# Patient Record
Sex: Female | Born: 1960 | Race: White | Hispanic: No | Marital: Married | Smoking: Never smoker
Health system: Southern US, Community
[De-identification: ages and names within clinical notes are randomized; demographics above are authoritative.]

## PROBLEM LIST (undated history)

## (undated) DIAGNOSIS — M2550 Pain in unspecified joint: Secondary | ICD-10-CM

## (undated) DIAGNOSIS — N289 Disorder of kidney and ureter, unspecified: Secondary | ICD-10-CM

## (undated) DIAGNOSIS — I10 Essential (primary) hypertension: Secondary | ICD-10-CM

## (undated) DIAGNOSIS — R5383 Other fatigue: Secondary | ICD-10-CM

## (undated) DIAGNOSIS — R0602 Shortness of breath: Secondary | ICD-10-CM

## (undated) DIAGNOSIS — B009 Herpesviral infection, unspecified: Secondary | ICD-10-CM

## (undated) DIAGNOSIS — M549 Dorsalgia, unspecified: Secondary | ICD-10-CM

## (undated) DIAGNOSIS — M25474 Effusion, right foot: Secondary | ICD-10-CM

## (undated) DIAGNOSIS — T7840XA Allergy, unspecified, initial encounter: Secondary | ICD-10-CM

## (undated) DIAGNOSIS — J45909 Unspecified asthma, uncomplicated: Secondary | ICD-10-CM

## (undated) DIAGNOSIS — M25475 Effusion, left foot: Secondary | ICD-10-CM

## (undated) DIAGNOSIS — R7303 Prediabetes: Secondary | ICD-10-CM

## (undated) DIAGNOSIS — E785 Hyperlipidemia, unspecified: Secondary | ICD-10-CM

## (undated) DIAGNOSIS — E559 Vitamin D deficiency, unspecified: Secondary | ICD-10-CM

## (undated) DIAGNOSIS — M25471 Effusion, right ankle: Secondary | ICD-10-CM

## (undated) DIAGNOSIS — M25472 Effusion, left ankle: Secondary | ICD-10-CM

## (undated) DIAGNOSIS — E119 Type 2 diabetes mellitus without complications: Secondary | ICD-10-CM

## (undated) DIAGNOSIS — K5900 Constipation, unspecified: Secondary | ICD-10-CM

## (undated) DIAGNOSIS — F419 Anxiety disorder, unspecified: Secondary | ICD-10-CM

## (undated) HISTORY — PX: COLONOSCOPY: SHX174

## (undated) HISTORY — DX: Prediabetes: R73.03

## (undated) HISTORY — DX: Effusion, left foot: M25.475

## (undated) HISTORY — DX: Vitamin D deficiency, unspecified: E55.9

## (undated) HISTORY — DX: Effusion, left ankle: M25.472

## (undated) HISTORY — DX: Disorder of kidney and ureter, unspecified: N28.9

## (undated) HISTORY — DX: Anxiety disorder, unspecified: F41.9

## (undated) HISTORY — DX: Unspecified asthma, uncomplicated: J45.909

## (undated) HISTORY — DX: Shortness of breath: R06.02

## (undated) HISTORY — DX: Effusion, left foot: M25.474

## (undated) HISTORY — DX: Allergy, unspecified, initial encounter: T78.40XA

## (undated) HISTORY — DX: Dorsalgia, unspecified: M54.9

## (undated) HISTORY — DX: Type 2 diabetes mellitus without complications: E11.9

## (undated) HISTORY — DX: Herpesviral infection, unspecified: B00.9

## (undated) HISTORY — DX: Hyperlipidemia, unspecified: E78.5

## (undated) HISTORY — DX: Effusion, right ankle: M25.471

## (undated) HISTORY — DX: Other fatigue: R53.83

## (undated) HISTORY — DX: Pain in unspecified joint: M25.50

## (undated) HISTORY — DX: Essential (primary) hypertension: I10

## (undated) HISTORY — DX: Constipation, unspecified: K59.00

---

## 1980-01-15 HISTORY — PX: WISDOM TOOTH EXTRACTION: SHX21

## 1996-01-15 HISTORY — PX: ANKLE SURGERY: SHX546

## 1999-06-07 ENCOUNTER — Other Ambulatory Visit: Admission: RE | Admit: 1999-06-07 | Discharge: 1999-06-07 | Payer: Self-pay | Admitting: Gynecology

## 2001-11-26 ENCOUNTER — Other Ambulatory Visit: Admission: RE | Admit: 2001-11-26 | Discharge: 2001-11-26 | Payer: Self-pay | Admitting: Gynecology

## 2002-07-07 ENCOUNTER — Encounter: Admission: RE | Admit: 2002-07-07 | Discharge: 2002-07-07 | Payer: Self-pay | Admitting: Gynecology

## 2002-07-07 ENCOUNTER — Encounter: Payer: Self-pay | Admitting: Gynecology

## 2002-12-21 ENCOUNTER — Other Ambulatory Visit: Admission: RE | Admit: 2002-12-21 | Discharge: 2002-12-21 | Payer: Self-pay | Admitting: Gynecology

## 2003-01-15 HISTORY — PX: TOTAL ABDOMINAL HYSTERECTOMY: SHX209

## 2003-09-27 ENCOUNTER — Encounter (INDEPENDENT_AMBULATORY_CARE_PROVIDER_SITE_OTHER): Payer: Self-pay | Admitting: Specialist

## 2003-09-28 ENCOUNTER — Inpatient Hospital Stay (HOSPITAL_COMMUNITY): Admission: RE | Admit: 2003-09-28 | Discharge: 2003-09-29 | Payer: Self-pay | Admitting: Gynecology

## 2004-05-15 ENCOUNTER — Other Ambulatory Visit: Admission: RE | Admit: 2004-05-15 | Discharge: 2004-05-15 | Payer: Self-pay | Admitting: Gynecology

## 2007-05-15 ENCOUNTER — Encounter: Admission: RE | Admit: 2007-05-15 | Discharge: 2007-05-15 | Payer: Self-pay | Admitting: Family Medicine

## 2008-06-20 ENCOUNTER — Encounter: Admission: RE | Admit: 2008-06-20 | Discharge: 2008-06-20 | Payer: Self-pay | Admitting: Family Medicine

## 2010-02-04 ENCOUNTER — Encounter: Payer: Self-pay | Admitting: Family Medicine

## 2010-06-01 NOTE — H&P (Signed)
NAME:  Debbie Castro, TADDEO NO.:  [id number]   MEDICAL RECORD NO.:  [id number]                   PATIENT TYPE:  AMB   LOCATION:  DAY                                  FACILITY:  [facility]   PHYSICIAN:  Gretta Cool, M.D.              DATE OF BIRTH:  [birth year]   DATE OF ADMISSION:  09/27/2003  DATE OF DISCHARGE:                                HISTORY & PHYSICAL   CHIEF COMPLAINT:  Abnormal uterine bleeding.   HISTORY OF PRESENT ILLNESS:  50 year old gravida 3, para 2, with a history  of increasingly heavier menses for the first couple of days each cycle with  bleeding through her clothing.  She has passage of clots.  She has no  significant prolongation of the cycle but has moderate cramping.  She has  had ultrasound evaluation which revealed multiple small uterine leiomyomata.  There is no evidence that this can be managed by conservative management by  hysteroscopy.  I have discussed all options and alternatives and the  relative risks and benefits.  I have answered all the questions regarding  those options and alternatives.  I discussed the different operative  approaches, as well, to definitive therapy.  She is now admitted for  laparoscopically assisted hysterectomy and possible salpingo-oophorectomy  and possible abdominal incision.   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  Medical illnesses none.  Accidents and injuries:  Rib fractures and  fractured ankle.  History of hemorrhoid surgery.  Hospitalization for  childbirth with vaginal delivery x 2, uncomplicated.  She has no significant  pelvic support complaints or issues.   SOCIAL HISTORY:  The patient is married with two children in the home.  She  is employed as a IT trainer.   FAMILY HISTORY:  Both mother and father are living and well.  There is no  known familial tendency to disease.  Three sisters in good health.   SOCIAL HISTORY:  She denies alcohol or tobacco.   She denies recreational  drugs.   CURRENT MEDICATIONS:  Seasonal allergy medications, Xanax for anxiety,  intermittent Imitrex therapy for migraines.   REVIEW OF SYMPTOMS:  HEENT:  Denies symptoms except occasional migraines.  CARDIORESPIRATORY:  Denies asthma, cough, arthritis, shortness of breath.  GI/GU:  Denies frequency, urgency, dysuria, change in bowel habits, food  intolerance.  She has occasional mixed pattern incontinence, does not  require pads.   PHYSICAL EXAMINATION:  GENERAL:  Well developed, very short white female, significantly over ideal  weight, 5 feet 2 1/2 inches and 206 pounds, BMI 36.5.  HEENT:  Pupils equal, round, reactive to light and accommodation, fundi not  examined, oropharynx clear.  NECK:  Supple without masses or thyromegaly.  CHEST:  Clear to P&A.  HEART:  Regular rhythm without murmur or cardiac enlargement.  BREASTS:  Without mass, nodes, or nipple discharge.  ABDOMEN:  Soft, increased abdomen to hip  ratio with large panniculus.  The  pelvic organs are not palpable.  No other masses.  PELVIC:  External genitalia normal female, vagina clean, cervix is parous,  clean, descends to the mid vagina with tenaculum attached, there is no  significant pelvic relaxation, perineal body musculature is excellent.  Rectovaginal exam confirms.  EXTREMITIES:  Negative.  NEUROLOGICAL:  Physiologic.   IMPRESSION:  1.  Uterine leiomyomata, multiple, small, with abnormal uterine bleeding,      unresponsive to conservative therapy.  2.  Increased BMI with large abdomen to hip ratio.  3.  Minimal mixed pattern incontinence.  4.  Seasonal allergies.   PLAN:  Laparoscopically assisted hysterectomy, possible salpingo-  oophorectomy versus TAH with possible BSO.  The risks and benefits are all  discussed, alternative therapies, all preoperative, postoperative, and  interoperative management discussed din detail.  The patient's questions  were answered regarding all  options and alternatives.  She wishes to proceed  with definitive therapy as outlined above.                                               Gretta Cool, M.D.    CWL/MEDQ  D:  09/26/2003  T:  09/26/2003  Job:  [id number]

## 2010-06-01 NOTE — Discharge Summary (Signed)
Debbie Castro, Debbie Castro NO.:  [id number]   MEDICAL RECORD NO.:  [id number]          PATIENT TYPE:  INP   LOCATION:  0440                         FACILITY:  [facility]   PHYSICIAN:  Gretta Cool, M.D. DATE OF BIRTH:  [birth year]   DATE OF ADMISSION:  09/27/2003  DATE OF DISCHARGE:  09/29/2003                                 DISCHARGE SUMMARY   HISTORY OF PRESENT ILLNESS:  Ms. Klatt is a 50 year old female, gravida 3,  para 2, who has a history of increasingly heavy menses with bleeding coming  through her clothing.  She also reports the passing of large clots, as well  as moderate cramping.  Ultrasound evaluation revealed multiple small uterine  leiomyomata.  There is no evidence that these can be managed conservatively.  Options were discussed with the patient and it was elected to proceed with  definitive therapy by laparoscopically assisted hysterectomy and possible  salpingo-oophorectomy.   PHYSICAL EXAMINATION:  CHEST:  Clear to A&P.  HEART:  Rate and rhythm regular without murmur, gallop, cardiac enlargement.  ABDOMEN:  Soft.  Increased abdomen to hip ratio with large panniculus.  No  masses or organomegaly.  PELVIC:  External genitalia within normal limits for female.  Vagina is  clean and rugous.  Cervix is parous and clean.  It is noted that the cervix  descends to the mid vagina with tenaculum attached and there is no  significant pelvic relaxation.  Perineal body musculature is excellent.  Rectovaginal exam confirms.   IMPRESSION:  1.  Uterine leiomyomata, multiple, small, with abnormal uterine bleeding,      unresponsive to conservative therapy.  2.  Increased body mass index with large abdomen to hip ratio/  3.  Minimal mixed pattern incontinence.  4.  Seasonal allergies.   PLAN:  Laparoscopically assisted hysterectomy, possible salpingo-  oophorectomy versus TAH with possible BSO.  Risks and benefits were  discussed with the patient as well as  alternative surgical procedures in  detail.  The patient accepts this plan.   LABORATORY DATA:  Admission hemoglobin 13.9, hematocrit 40.3.  On the first  postoperative day hemoglobin was 11.4, hematocrit 33.  Urine pregnancy test  was negative.   HOSPITAL COURSE:  The patient underwent an attempted laparoscopically  assisted hysterectomy which was abandoned and total abdominal hysterectomy  was performed under general anesthesia.  The attempted laparoscopically  assisted hysterectomy was abandoned because the depth of the umbilicus  prevented fluid return and no significant carbon dioxide pneumoperitoneum  could be obtained.  At this point the abdominal hysterectomy was completed  without any complications and the patient returned to the recovery room in  excellent condition.  Pathology report revealed cervix with slight  cervicitis and squamous metaplasia.  No dysplasia identified.  Proliferative  endometrium, no hyperplasia or carcinoma identified.  Leiomyomata intramural  and benign uterine serosa.  No endometriosis or malignancy identified.  Her  postoperative course was without complications and she was discharged on the  first postoperative day in excellent condition.   FINAL DISCHARGE INSTRUCTIONS:   ACTIVITY:  No heavy lifting or straining,  no vaginal entrance, increased  ambulation as tolerated.   FOLLOW UP:  She is to call for any fever over 100.5 or failure of daily  improvement.   DIET:  Regular.   MEDICATIONS:  1.  Milk of magnesia.  2.  Dulcolax or Fleets enema as needed to keep bowels soft and moving.  3.  Tylenol one p.o. q.3-4h. p.r.n. discomfort.   FOLLOW UP:  She is to return to the office in one week for follow-up.   CONDITION ON DISCHARGE:  Excellent.   FINAL DIAGNOSES:  1.  Abnormal uterine bleeding, unresponsive to conservative therapy.  2.  Uterine leiomyomata.   PROCEDURES PERFORMED:  1.  Attempted laparoscopically assisted hysterectomy,  abandoned.  Proceeded      to total abdominal hysterectomy without complications.  2.  General anesthesia.      EMK/MEDQ  D:  10/31/2003  T:  10/31/2003  Job:  [id number]

## 2010-06-01 NOTE — Op Note (Signed)
NAME:  Debbie Castro, Debbie Castro NO.:  [id number]   MEDICAL RECORD NO.:  [id number]                   PATIENT TYPE:  OBV   LOCATION:  0440                                 FACILITY:  [facility]   PHYSICIAN:  Gretta Cool, M.D.              DATE OF BIRTH:  August 08, 1960   DATE OF PROCEDURE:  09/27/2003  DATE OF DISCHARGE:                                 OPERATIVE REPORT   PREOPERATIVE DIAGNOSES:  Abnormal uterine bleeding, unresponsive to  conservative therapy with uterine leiomyomata and probable adenomyosis.  No  evidence of luminal intrusion.   POSTOPERATIVE DIAGNOSES:  Abnormal uterine bleeding, unresponsive to  conservative therapy with uterine leiomyomata and probable adenomyosis.  No  evidence of luminal intrusion.   PROCEDURE:  Attempted laparoscopically assisted hysterectomy - abandoned.  Total abdominal hysterectomy.   SURGEON:  Gretta Cool, M.D.   ASSISTANT:  Raynald Kemp, M.D.   ANESTHESIA:  General orotracheal.   DESCRIPTION OF PROCEDURE:  Under excellent general anesthesia with patient  prepped and draped in the Allen stirrups, with the Hulka tenaculum applied  to her cervix, a Veress cannula was introduced through an umbilicus  incision.  Even though placement was assured with saline, the pressure was  high enough that no significant carbon dioxide could be delivered.  A  longer, Veress cannula for excessive abdominal girth of patient was then  obtained, and still adequate carbon dioxide flow with safe pressures could  not be achieved.   The cul-de-sac was then entered so as to deliver the CO2 through the cul-de-  sac, but even though fluid could be returned, no significant carbon dioxide  pneumoperitoneum could be obtained.  At this point, because of the depth of  the umbilicus, the procedure was abandoned and decision was made to proceed  to abdominal hysterectomy.   Pfannenstiel incision was made and extended through the fascia.  The  rectus  muscle was then separated in the midline, the peritoneum opened.  The upper  abdomen was then explored.  There was an enormous amount of intra-abdominal  fat and omentum, no evidence of intraperitoneal injury.  Retractors were  placed and the bowel packed away. The Balfour extender was used to allow  adequate visualization. The uterus was elevated with jelly clamps across the  adnexal structures. The round ligament were then sutured and transected.  Next, the ovarian ligaments were then clamped, cut, sutured and tied with 0-  Vicryl.  Anterior leaf of the broad ligament was pushed off the lower  uterine segment. The posterior leaf was then opened and the uterine vessels  exposed, skeletonized, clamped, cut, sutured and tied with 0-Vicryl.  Next,  the cardinal ligaments were clamped, cut, sutured and tied with 0-Vicryl.  The cervix was then removed with an intra-fascial technique, so as to leave  the lower portion of the cardinals and the uterosacral ligaments attached to  the cervix. A  core of cervix including the entire squamocolumnar junction  was then removed.  The cervical fascia was then approximated and the vagina  closed transversely.  The ligaments were left intact and remained attached  to the cervix.   At this point, the pelvic peritoneum was closed with a running suture of 2-0  Monocryl. The retractors and packs were then removed.  Pelvis was irrigated  once again and the abdominal peritoneum closed with running suture of 0-  Monocryl.  Rectus muscles were plicated also with 0-Monocryl. The fascia was  then approximated from each angle  to the midline, subcutaneous tissues approximated with interrupted sutures  of 3-0 Vicryl and skin was closed with skin staples and Steri-Strips.  At  the end of the procedure, sponge and lap counts were correct. There were no  complications.  The patient returned to the recovery room in excellent  condition.                                                Gretta Cool, M.D.    CWL/MEDQ  D:  09/28/2003  T:  09/28/2003  Job:  [id number]   cc:   Mosetta Putt, M.D.  [address] [place]  [place] [address]  Fax: [phone]

## 2010-12-25 ENCOUNTER — Other Ambulatory Visit: Payer: Self-pay | Admitting: Family Medicine

## 2010-12-25 DIAGNOSIS — Z1231 Encounter for screening mammogram for malignant neoplasm of breast: Secondary | ICD-10-CM

## 2010-12-27 ENCOUNTER — Encounter: Payer: Self-pay | Admitting: Gastroenterology

## 2011-01-24 ENCOUNTER — Ambulatory Visit: Payer: Self-pay

## 2011-01-28 ENCOUNTER — Ambulatory Visit (AMBULATORY_SURGERY_CENTER): Payer: BC Managed Care – PPO | Admitting: *Deleted

## 2011-01-28 VITALS — Ht 63.0 in | Wt 233.0 lb

## 2011-01-28 DIAGNOSIS — Z1211 Encounter for screening for malignant neoplasm of colon: Secondary | ICD-10-CM

## 2011-01-28 MED ORDER — PEG-KCL-NACL-NASULF-NA ASC-C 100 G PO SOLR: ORAL | Status: DC

## 2011-01-30 ENCOUNTER — Ambulatory Visit
Admission: RE | Admit: 2011-01-30 | Discharge: 2011-01-30 | Disposition: A | Discharge status: Home / Self Care | Payer: BC Managed Care – PPO | Source: Ambulatory Visit | Attending: Family Medicine | Admitting: Family Medicine

## 2011-01-30 DIAGNOSIS — Z1231 Encounter for screening mammogram for malignant neoplasm of breast: Secondary | ICD-10-CM

## 2011-02-08 ENCOUNTER — Encounter: Payer: Self-pay | Admitting: Gastroenterology

## 2011-02-08 ENCOUNTER — Ambulatory Visit (AMBULATORY_SURGERY_CENTER): Payer: BC Managed Care – PPO | Admitting: Gastroenterology

## 2011-02-08 VITALS — BP 131/89 | HR 81 | Temp 98.3°F | Resp 20 | Ht 63.0 in | Wt 233.0 lb

## 2011-02-08 DIAGNOSIS — Z1211 Encounter for screening for malignant neoplasm of colon: Secondary | ICD-10-CM

## 2011-02-08 MED ORDER — SODIUM CHLORIDE 0.9 % IV SOLN: 500.0000 mL | INTRAVENOUS | Status: DC

## 2011-02-08 NOTE — Op Note (Signed)
[facility] [address] [place], [place]  [address]  COLONOSCOPY PROCEDURE REPORT  PATIENT:  Debbie Castro, Debbie Castro  MR#:  [id number] BIRTHDATE:  04-Aug-1960, 51 yrs. old  GENDER:  female ENDOSCOPIST:  Judie Petit T. Russella Dar, MD, [facility] Referred by:  Mosetta Putt, M.D. PROCEDURE DATE:  02/08/2011 PROCEDURE:  Colonoscopy 19147 ASA CLASS:  Class II INDICATIONS:  1) Routine Risk Screening MEDICATIONS:   These medications were titrated to patient response per physician's verbal order, Fentanyl 75 mcg IV, Versed 9 mg IV DESCRIPTION OF PROCEDURE:   After the risks benefits and alternatives of the procedure were thoroughly explained, informed consent was obtained.  Digital rectal exam was performed and revealed no abnormalities.   The LB [id number]-H180AL [id number] endoscope was introduced through the anus and advanced to the cecum, which was identified by both the appendix and ileocecal valve, without limitations.  The quality of the prep was excellent, using MoviPrep.  The instrument was then slowly withdrawn as the colon was fully examined.  <<PROCEDUREIMAGES>>  FINDINGS:  Mild diverticulosis was found in the sigmoid colon. Otherwise normal colonoscopy without other polyps, masses, vascular ectasias, or inflammatory changes.  Retroflexed views in the rectum revealed no abnormalities. The time to cecum =  2.5 minutes. The scope was then withdrawn (time =  10.5  min) from the patient and the procedure completed.  COMPLICATIONS:  None  ENDOSCOPIC IMPRESSION: 1) Mild diverticulosis in the sigmoid colon  RECOMMENDATIONS: 1) High fiber diet with liberal fluid intake. 2) Continue current colorectal screening for "routine risk" patients with a repeat colonoscopy in 10 years.  Venita Lick. Russella Dar, MD, [facility]  n. eSIGNED:   Venita Lick. Levie Wages at 02/08/2011 11:34 AM  Wynonia Lawman, [phone]

## 2011-02-08 NOTE — Progress Notes (Signed)
No complaints noted on discharge. Maw  Patient did not experience any of the following events: a burn prior to discharge; a fall within the facility; wrong site/side/patient/procedure/implant event; or a hospital transfer or hospital admission upon discharge from the facility. (G8907) Patient did not have preoperative order for IV antibiotic SSI prophylaxis. (G8918)  

## 2011-02-08 NOTE — Patient Instructions (Signed)
FOLLOW DISCHARGE INSTRUCTIONS (BLUE AND GREEN SHEETS).. 

## 2011-02-11 ENCOUNTER — Telehealth: Payer: Self-pay | Admitting: *Deleted

## 2011-02-11 NOTE — Telephone Encounter (Signed)
  Follow up Call-  Call back number 02/08/2011  Post procedure Call Back phone  # [phone] hm okay to leave a message     Patient questions:  Do you have a fever, pain , or abdominal swelling? no Pain Score  0 *  Have you tolerated food without any problems? yes  Have you been able to return to your normal activities? yes  Do you have any questions about your discharge instructions: Diet   no Medications  no Follow up visit  no  Do you have questions or concerns about your Care? no  Actions: * If pain score is 4 or above: No action needed, pain <4.

## 2011-12-19 ENCOUNTER — Other Ambulatory Visit: Payer: Self-pay | Admitting: Family Medicine

## 2011-12-19 DIAGNOSIS — Z780 Asymptomatic menopausal state: Secondary | ICD-10-CM

## 2011-12-19 DIAGNOSIS — R29890 Loss of height: Secondary | ICD-10-CM

## 2011-12-19 DIAGNOSIS — Z1231 Encounter for screening mammogram for malignant neoplasm of breast: Secondary | ICD-10-CM

## 2012-02-03 ENCOUNTER — Ambulatory Visit: Payer: BC Managed Care – PPO

## 2012-02-03 ENCOUNTER — Other Ambulatory Visit: Payer: BC Managed Care – PPO

## 2012-02-10 ENCOUNTER — Ambulatory Visit
Admission: RE | Admit: 2012-02-10 | Discharge: 2012-02-10 | Disposition: A | Discharge status: Home / Self Care | Payer: BC Managed Care – PPO | Source: Ambulatory Visit | Attending: Family Medicine | Admitting: Family Medicine

## 2012-02-10 DIAGNOSIS — R29890 Loss of height: Secondary | ICD-10-CM

## 2012-02-10 DIAGNOSIS — Z1231 Encounter for screening mammogram for malignant neoplasm of breast: Secondary | ICD-10-CM

## 2012-02-10 DIAGNOSIS — Z780 Asymptomatic menopausal state: Secondary | ICD-10-CM

## 2013-01-13 ENCOUNTER — Other Ambulatory Visit: Payer: Self-pay

## 2013-01-13 DIAGNOSIS — Z1231 Encounter for screening mammogram for malignant neoplasm of breast: Secondary | ICD-10-CM

## 2013-02-11 ENCOUNTER — Ambulatory Visit: Payer: BC Managed Care – PPO

## 2013-02-15 ENCOUNTER — Ambulatory Visit
Admission: RE | Admit: 2013-02-15 | Discharge: 2013-02-15 | Disposition: A | Discharge status: Home / Self Care | Payer: BC Managed Care – PPO | Source: Ambulatory Visit

## 2013-02-15 DIAGNOSIS — Z1231 Encounter for screening mammogram for malignant neoplasm of breast: Secondary | ICD-10-CM

## 2014-01-24 ENCOUNTER — Other Ambulatory Visit: Payer: Self-pay

## 2014-01-24 DIAGNOSIS — Z1231 Encounter for screening mammogram for malignant neoplasm of breast: Secondary | ICD-10-CM

## 2014-02-17 ENCOUNTER — Ambulatory Visit
Admission: RE | Admit: 2014-02-17 | Discharge: 2014-02-17 | Disposition: A | Discharge status: Home / Self Care | Payer: BLUE CROSS/BLUE SHIELD | Source: Ambulatory Visit

## 2014-02-17 DIAGNOSIS — Z1231 Encounter for screening mammogram for malignant neoplasm of breast: Secondary | ICD-10-CM

## 2014-02-22 ENCOUNTER — Telehealth: Payer: Self-pay | Admitting: Obstetrics and Gynecology

## 2014-02-22 ENCOUNTER — Encounter: Payer: Self-pay | Admitting: Obstetrics and Gynecology

## 2014-02-22 NOTE — Telephone Encounter (Signed)
Left patient a message to call back to reschedule her new patient appointment with Dr. Quincy Simmonds today that was cancelled due to a surgery delay.

## 2014-02-25 ENCOUNTER — Encounter: Payer: Self-pay | Admitting: Obstetrics and Gynecology

## 2014-03-24 ENCOUNTER — Ambulatory Visit (INDEPENDENT_AMBULATORY_CARE_PROVIDER_SITE_OTHER): Payer: BLUE CROSS/BLUE SHIELD | Admitting: Obstetrics and Gynecology

## 2014-03-24 ENCOUNTER — Encounter: Payer: Self-pay | Admitting: Obstetrics and Gynecology

## 2014-03-24 VITALS — BP 122/78 | HR 88 | Resp 16 | Ht 63.0 in | Wt 220.0 lb

## 2014-03-24 DIAGNOSIS — Z01419 Encounter for gynecological examination (general) (routine) without abnormal findings: Secondary | ICD-10-CM

## 2014-03-24 DIAGNOSIS — N393 Stress incontinence (female) (male): Secondary | ICD-10-CM

## 2014-03-24 DIAGNOSIS — Z0000 Encounter for general adult medical examination without abnormal findings: Secondary | ICD-10-CM | POA: Diagnosis not present

## 2014-03-24 LAB — POCT URINALYSIS DIPSTICK
Bilirubin, UA: NEGATIVE
Blood, UA: NEGATIVE
GLUCOSE UA: NEGATIVE
KETONES UA: NEGATIVE
Nitrite, UA: NEGATIVE
Protein, UA: NEGATIVE
Urobilinogen, UA: NEGATIVE
pH, UA: 5

## 2014-03-24 NOTE — Progress Notes (Signed)
54 y.o. [id number] MarriedCaucasianF here for annual exam.    Wears a pad due to urinary incontinence.  Leaks with sneezing with allergy season.  Leaks with cough, laugh, exercise.  Can leak if waits too long and then feels like she almost cannot get there on time.   Some hot flashes at night.  Rarely during the day.  Notes some changes in her memory sharpness.   Wants to loose weight.  Has just joined a gym.  Followed by Dr. Sandi Mariscal for microscopic hematuria.   Travels a lot for work.  Can be gone for a year at at time.  Is a PCA.   PCP - Dr. Sandi Mariscal.   No LMP recorded. Patient has had a hysterectomy.          Sexually active: Yes.    The current method of family planning is status post hysterectomy in 2005.  Fibroids.  Ovaries remain. Exercising: No.  The patient does not participate in regular exercise at present. Smoker:  no  Health Maintenance: Pap: ~2005 before hysterectomy  History of abnormal Pap:  no MMG: 02/18/14 BIRADS1:neg Self Breast Exam: yes, once a week. Colonoscopy:  01/2011 Normal - Every 10 years  BMD:   01/2012 - normal.  TDaP:  ? Screening Labs: PCP, Hb today: PCP, Urine today: WBC=Trace - no symptoms.     reports that she has never smoked. She has never used smokeless tobacco. She reports that she drinks about 0.6 oz of alcohol per week. She reports that she does not use illicit drugs.  Past Medical History  Diagnosis Date  . Anxiety     occasional  . Allergy     mold  . Hyperlipidemia     Past Surgical History  Procedure Laterality Date  . Total abdominal hysterectomy  2005    still has ovaries  . Ankle surgery  1998    right  . Wisdom tooth extraction  1982    Current Outpatient Prescriptions  Medication Sig Dispense Refill  . acyclovir (ZOVIRAX) 400 MG tablet Take 400 mg by mouth as needed.     . ALPRAZolam (XANAX) 0.5 MG tablet Take 0.25 tablets by mouth as needed. Rarely takes for anxiety    . cetirizine (ZYRTEC) 10 MG tablet Take 10  mg by mouth as needed. allergies    . metFORMIN (GLUCOPHAGE-XR) 500 MG 24 hr tablet Take 3 tablets by mouth daily.    . Multiple Vitamins-Minerals (MULTIVITAMIN WITH MINERALS) tablet Take 1 tablet by mouth as needed.    . naproxen sodium (ANAPROX) 220 MG tablet Take 440 mg by mouth as needed. Aches and pain    . pravastatin (PRAVACHOL) 80 MG tablet Take 1 tablet by mouth daily.    Marland Kitchen triamterene-hydrochlorothiazide (MAXZIDE-25) 37.5-25 MG per tablet Take 1 tablet by mouth daily.     No current facility-administered medications for this visit.    Family History  Problem Relation Age of Onset  . Colon cancer Neg Hx   . Esophageal cancer Neg Hx   . Stomach cancer Neg Hx   . Cancer Maternal Grandfather     ROS:  Pertinent items are noted in HPI.  Otherwise, a comprehensive ROS was negative.  Exam:   BP 122/78 mmHg  Pulse 88  Resp 16  Ht 5\' 3"  (1.6 m)  Wt 220 lb (99.791 kg)  BMI 38.98 kg/m2      Height: 5\' 3"  (160 cm)  Ht Readings from Last 3 Encounters:  03/24/14 5\' 3"  (1.6  m)  02/08/11 5\' 3"  (1.6 m)  01/28/11 5\' 3"  (1.6 m)    General appearance: alert, cooperative and appears stated age Head: Normocephalic, without obvious abnormality, atraumatic Neck: no adenopathy, supple, symmetrical, trachea midline and thyroid normal to inspection and palpation Lungs: clear to auscultation bilaterally Breasts: normal appearance, no masses or tenderness, Inspection negative, No nipple retraction or dimpling, No nipple discharge or bleeding, No axillary or supraclavicular adenopathy Heart: regular rate and rhythm Abdomen: Obese, pfannenstiel difficult to see, soft, non-tender; bowel sounds normal; no masses,  no organomegaly Extremities: extremities normal, atraumatic, no cyanosis or edema Skin: Skin color, texture, turgor normal. No rashes or lesions Lymph nodes: Cervical, supraclavicular, and axillary nodes normal. No abnormal inguinal nodes palpated Neurologic: Grossly  normal   Pelvic: External genitalia:  no lesions              Urethra:  normal appearing urethra with no masses, tenderness or lesions              Bartholins and Skenes: normal                 Vagina: normal appearing vagina with normal color and discharge, no lesions.  Good support.  Exam limited by body habitus. Strong Kegel.              Cervix: absent              Pap taken: No. Bimanual Exam:  Uterus:  uterus absent              Adnexa: normal adnexa               Rectovaginal: Confirms               Anus:  normal sphincter tone, no lesions  Chaperone was present for exam.  A:  Well Woman with normal exam Status post TAH. Genuine stress incontinence. Obesity.   P:   Mammogram yearly.  pap smear not indicated.  Discussed genuine stress incontinence - etiologies and treatment options - observation, Kegels, weight loss, physical therapy, and midurethral sling.  ACOG handouts on incontinence and surgical treatment for incontinence to patient.  Will work of weight loss and Kegel's at home.  return annually or prn

## 2014-03-24 NOTE — Patient Instructions (Signed)

## 2014-04-04 ENCOUNTER — Encounter: Payer: Self-pay | Admitting: Obstetrics and Gynecology

## 2015-01-13 ENCOUNTER — Other Ambulatory Visit: Payer: Self-pay

## 2015-01-13 ENCOUNTER — Other Ambulatory Visit: Payer: Self-pay | Admitting: Family Medicine

## 2015-01-13 DIAGNOSIS — Z1231 Encounter for screening mammogram for malignant neoplasm of breast: Secondary | ICD-10-CM

## 2015-01-13 DIAGNOSIS — E2839 Other primary ovarian failure: Secondary | ICD-10-CM

## 2015-02-21 ENCOUNTER — Ambulatory Visit
Admission: RE | Admit: 2015-02-21 | Discharge: 2015-02-21 | Disposition: A | Discharge status: Home / Self Care | Payer: BLUE CROSS/BLUE SHIELD | Source: Ambulatory Visit

## 2015-02-21 ENCOUNTER — Ambulatory Visit
Admission: RE | Admit: 2015-02-21 | Discharge: 2015-02-21 | Disposition: A | Discharge status: Home / Self Care | Payer: BLUE CROSS/BLUE SHIELD | Source: Ambulatory Visit | Attending: Family Medicine | Admitting: Family Medicine

## 2015-02-21 DIAGNOSIS — Z1231 Encounter for screening mammogram for malignant neoplasm of breast: Secondary | ICD-10-CM

## 2015-02-21 DIAGNOSIS — E2839 Other primary ovarian failure: Secondary | ICD-10-CM

## 2015-02-24 ENCOUNTER — Other Ambulatory Visit: Payer: Self-pay | Admitting: Family Medicine

## 2015-02-24 DIAGNOSIS — R928 Other abnormal and inconclusive findings on diagnostic imaging of breast: Secondary | ICD-10-CM

## 2015-03-01 ENCOUNTER — Ambulatory Visit
Admission: RE | Admit: 2015-03-01 | Discharge: 2015-03-01 | Disposition: A | Discharge status: Home / Self Care | Payer: Federal, State, Local not specified - PPO | Source: Ambulatory Visit | Attending: Family Medicine | Admitting: Family Medicine

## 2015-03-01 DIAGNOSIS — R928 Other abnormal and inconclusive findings on diagnostic imaging of breast: Secondary | ICD-10-CM

## 2015-04-07 ENCOUNTER — Ambulatory Visit: Payer: BLUE CROSS/BLUE SHIELD | Admitting: Obstetrics and Gynecology

## 2015-04-07 ENCOUNTER — Encounter: Payer: Self-pay | Admitting: Obstetrics and Gynecology

## 2015-04-07 ENCOUNTER — Ambulatory Visit (INDEPENDENT_AMBULATORY_CARE_PROVIDER_SITE_OTHER): Payer: Federal, State, Local not specified - PPO | Admitting: Obstetrics and Gynecology

## 2015-04-07 VITALS — BP 120/74 | HR 68 | Ht 63.0 in | Wt 198.0 lb

## 2015-04-07 DIAGNOSIS — Z01419 Encounter for gynecological examination (general) (routine) without abnormal findings: Secondary | ICD-10-CM | POA: Diagnosis not present

## 2015-04-07 NOTE — Patient Instructions (Signed)

## 2015-04-07 NOTE — Progress Notes (Signed)
Patient ID: Debbie Castro, female   DOB: 04-06-1960, 55 y.o.   MRN: [id number]  55 y.o. G47P2012 Married Caucasian female here for annual exam.    Urinary incontinence with sneeze or cough, especially with sneeze or cough.  Wears a pad all the time.   Lost a little over 20 pounds.  Doing low carb diet.  Doing walking.   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  PCP:   Derinda Late, MD  No LMP recorded. Patient has had a hysterectomy.          Sexually active: Yes.    The current method of family planning is status post hysterectomy.    Exercising: No.  The patient does not participate in regular exercise at present. Smoker:  no  Health Maintenance: Pap: ~2005 before hysterectomy  History of abnormal Pap: no MMG: 02/21/15, heterogeneously dense breast tissue, Right Diagnostic 03/01/15, Bi-Rads 3: probably benign, follow in 6 months.    Self Breast Exam: yes Colonoscopy: 01/2011 Normal - Every 10 years  BMD:02/21/15, 0.8 Spine / -0.4 Right Femur Neck TDaP: UTD with PCP Screening Labs:  Hb today: PCP, Urine today: PCP   reports that she has never smoked. She has never used smokeless tobacco. She reports that she drinks about 0.6 oz of alcohol per week. She reports that she does not use illicit drugs.  Past Medical History  Diagnosis Date  . Anxiety     occasional  . Allergy     mold  . Hyperlipidemia     Past Surgical History  Procedure Laterality Date  . Total abdominal hysterectomy  2005    still has ovaries  . Ankle surgery  1998    right  . Wisdom tooth extraction  1982    Current Outpatient Prescriptions  Medication Sig Dispense Refill  . acyclovir (ZOVIRAX) 400 MG tablet Take 400 mg by mouth as needed.     . ALPRAZolam (XANAX) 0.5 MG tablet Take 0.25 tablets by mouth as needed. Rarely takes for anxiety    . cetirizine (ZYRTEC) 10 MG tablet Take 10 mg by mouth as needed. allergies    . metFORMIN (GLUCOPHAGE-XR) 500 MG 24 hr tablet Take 3 tablets by mouth  daily.    . Multiple Vitamins-Minerals (MULTIVITAMIN WITH MINERALS) tablet Take 1 tablet by mouth as needed.    . naproxen sodium (ANAPROX) 220 MG tablet Take 440 mg by mouth as needed. Aches and pain    . pravastatin (PRAVACHOL) 80 MG tablet Take 1 tablet by mouth daily.    Marland Kitchen triamterene-hydrochlorothiazide (MAXZIDE-25) 37.5-25 MG per tablet Take 1 tablet by mouth daily.     No current facility-administered medications for this visit.    Family History  Problem Relation Age of Onset  . Colon cancer Neg Hx   . Esophageal cancer Neg Hx   . Stomach cancer Neg Hx   . Cancer Maternal Grandfather   . Glaucoma Mother     loss of vision left eye    ROS:  Pertinent items are noted in HPI.  Otherwise, a comprehensive ROS was negative.  Exam:   BP 120/74 mmHg  Pulse 68  Ht 5\' 3"  (1.6 m)  Wt 198 lb (89.812 kg)  BMI 35.08 kg/m2    General appearance: alert, cooperative and appears stated age Head: Normocephalic, without obvious abnormality, atraumatic Neck: no adenopathy, supple, symmetrical, trachea midline and thyroid normal to inspection and palpation Lungs: clear to auscultation bilaterally Breasts: normal appearance, no masses or tenderness, Inspection  negative, No nipple retraction or dimpling, No nipple discharge or bleeding, No axillary or supraclavicular adenopathy Heart: regular rate and rhythm Abdomen: soft, non-tender; bowel sounds normal; no masses,  no organomegaly Extremities: extremities normal, atraumatic, no cyanosis or edema Skin: Skin color, texture, turgor normal. No rashes or lesions Lymph nodes: Cervical, supraclavicular, and axillary nodes normal. No abnormal inguinal nodes palpated Neurologic: Grossly normal  Pelvic: External genitalia:  no lesions              Urethra:  normal appearing urethra with no masses, tenderness or lesions              Bartholins and Skenes: normal                 Vagina: normal appearing vagina with normal color and discharge, no  lesions.  Good support.  Good Kegel.               Cervix: absent              Pap taken: No. Bimanual Exam:  Uterus:  uterus absent              Adnexa: normal adnexa and no mass, fullness, tenderness              Rectovaginal: No..   Declined.   Chaperone was present for exam.  Assessment:   Well woman visit with normal exam. Status post TAH.  Ovaries remain.  GSI.  Right breast calcifications. Successful weight loss!  Plan: Yearly mammogram recommended after age 44.  Follow up mammogram in August 2017. Recommended self breast exam.  Pap and HR HPV as above. Discussed Calcium, Vitamin D, regular exercise program including cardiovascular and weight bearing exercise. Labs performed.  No..     Refills given on medications.  No..   Discussed continued weight loss and Kegel's to improve bladder control.  Discussed formal pelvic floor therapy and surgery for stress incontinence.   I encouraged further weight loss prior to surgical care for incontinence.   Follow up annually and prn.     After visit summary provided.

## 2015-07-28 ENCOUNTER — Other Ambulatory Visit: Payer: Self-pay | Admitting: Family Medicine

## 2015-07-28 DIAGNOSIS — R921 Mammographic calcification found on diagnostic imaging of breast: Secondary | ICD-10-CM

## 2015-08-30 ENCOUNTER — Ambulatory Visit
Admission: RE | Admit: 2015-08-30 | Discharge: 2015-08-30 | Disposition: A | Discharge status: Home / Self Care | Payer: Federal, State, Local not specified - PPO | Source: Ambulatory Visit | Attending: Family Medicine | Admitting: Family Medicine

## 2015-08-30 DIAGNOSIS — R921 Mammographic calcification found on diagnostic imaging of breast: Secondary | ICD-10-CM

## 2016-03-18 ENCOUNTER — Other Ambulatory Visit: Payer: Self-pay | Admitting: Family Medicine

## 2016-03-18 DIAGNOSIS — R921 Mammographic calcification found on diagnostic imaging of breast: Secondary | ICD-10-CM

## 2016-03-27 ENCOUNTER — Ambulatory Visit
Admission: RE | Admit: 2016-03-27 | Discharge: 2016-03-27 | Disposition: A | Discharge status: Home / Self Care | Payer: Federal, State, Local not specified - PPO | Source: Ambulatory Visit | Attending: Family Medicine | Admitting: Family Medicine

## 2016-03-27 DIAGNOSIS — R921 Mammographic calcification found on diagnostic imaging of breast: Secondary | ICD-10-CM

## 2016-04-19 ENCOUNTER — Ambulatory Visit: Payer: Federal, State, Local not specified - PPO | Admitting: Obstetrics and Gynecology

## 2016-04-29 NOTE — Progress Notes (Signed)
56 y.o. G59P2012 Married Caucasian female here for annual exam.    Blood sugar under control per patient.   Leakage of urine with cough and sneeze.  Notices she is better when she looses weight.  Struggling with weight gain.  Wearing pads daily.   No vaginal dryness.  Some hot flashes but not consistent. Wonders if she has gone into menopause yet.  Works for [employer].  Travels a lot for work.   PCP:  Dr. Norval Gable   No LMP recorded. Patient has had a hysterectomy.           Sexually active: Yes.    The current method of family planning is status post hysterectomy--ovaries remain.    Exercising: No.  The patient does not participate in regular exercise at present. Smoker:  no  Health Maintenance: Pap: 2005 normal History of abnormal Pap:  no MMG: 03-27-16 Diag.Bil.Density B/stable probably benign Rt.Br.calcifications/Neg/Recommended  Bil.Diag.in 12 mo.with magnification views of Rt.Br. Which will demonstrate 2 yrs of stability/BiRads3:TBC  Colonoscopy: 02-08-2011 Normal with mild diverticulosis with Dr.Stark;next due 01/2021.   BMD:   02-21-15  Result: Normal:TBC TDaP: PCP Gardasil:   no HIV: discuss with PCP Hep C: discuss with PCP Screening Labs: PCP   reports that she has never smoked. She has never used smokeless tobacco. She reports that she drinks about 0.6 oz of alcohol per week . She reports that she does not use drugs.  Past Medical History:  Diagnosis Date  . Allergy    mold  . Anxiety    occasional  . HSV-1 infection    Fever blisters.  . Hyperlipidemia     Past Surgical History:  Procedure Laterality Date  . [place]   right  . TOTAL ABDOMINAL HYSTERECTOMY  2005   still has ovaries  . WISDOM TOOTH EXTRACTION  1982    Current Outpatient Prescriptions  Medication Sig Dispense Refill  . acyclovir (ZOVIRAX) 400 MG tablet Take 400 mg by mouth as needed.     . ALPRAZolam (XANAX) 0.5 MG tablet Take 0.25 tablets by mouth as needed. Rarely takes  for anxiety    . cetirizine (ZYRTEC) 10 MG tablet Take 10 mg by mouth as needed. allergies    . metFORMIN (GLUCOPHAGE-XR) 500 MG 24 hr tablet Take 3 tablets by mouth daily.    . naproxen sodium (ANAPROX) 220 MG tablet Take 440 mg by mouth as needed. Aches and pain    . rosuvastatin (CRESTOR) 20 MG tablet Take 20 mg by mouth daily.    Marland Kitchen triamterene-hydrochlorothiazide (MAXZIDE-25) 37.5-25 MG per tablet Take 1 tablet by mouth daily.     No current facility-administered medications for this visit.     Family History  Problem Relation Age of Onset  . Glaucoma Mother     loss of vision left eye  . Cancer Maternal Grandfather   . Colon cancer Neg Hx   . Esophageal cancer Neg Hx   . Stomach cancer Neg Hx     ROS:  Pertinent items are noted in HPI.  Otherwise, a comprehensive ROS was negative.  Exam:   BP 110/70 (BP Location: Right Arm, Patient Position: Sitting, Cuff Size: Normal)   Pulse 78   Resp 12   Ht 5' 2.75" (1.594 m)   Wt 201 lb 12.8 oz (91.5 kg)   BMI 36.03 kg/m     General appearance: alert, cooperative and appears stated age Head: Normocephalic, without obvious abnormality, atraumatic Neck: no adenopathy, supple, symmetrical, trachea  midline and thyroid normal to inspection and palpation Lungs: clear to auscultation bilaterally Breasts: normal appearance, no masses or tenderness, No nipple retraction or dimpling, No nipple discharge or bleeding, No axillary or supraclavicular adenopathy Heart: regular rate and rhythm Abdomen: soft, non-tender; no masses, no organomegaly Extremities: extremities normal, atraumatic, no cyanosis or edema Skin: Skin color, texture, turgor normal. No rashes or lesions Lymph nodes: Cervical, supraclavicular, and axillary nodes normal. No abnormal inguinal nodes palpated Neurologic: Grossly normal  Pelvic: External genitalia:  no lesions              Urethra:  normal appearing urethra with no masses, tenderness or lesions               Bartholins and Skenes: normal                 Vagina: normal appearing vagina with normal color and discharge, no lesions.  Good support.              Cervix: absent.               Pap taken: No. Bimanual Exam:  Uterus:  Absent.               Adnexa: no mass, fullness, tenderness              Rectal exam: Yes.  .  Confirms.              Anus:  normal sphincter tone, no lesions  Chaperone was present for exam.  Assessment:   Well woman visit with normal exam. Status post TAH.  Ovaries remain.  GSI.  Menopausal symptoms.   Plan: Mammogram screening discussed. Recommended self breast awareness. Pap and HR HPV as above. Guidelines for Calcium, Vitamin D, regular exercise program including cardiovascular and weight bearing exercise. Discussed pelvic floor PT, weight loss, Impressa, diaphragm, and surgery for tx of urinary stress incontinence. ACOG HO on incontinence. We discussed menopause and importance of reducing risk of cardiovascular disease and having good bone health.  Brochure on menopause.  She will considering having an [place] and estradiol.  She is interested in this.   Follow up annually and prn.   After visit summary provided.

## 2016-05-02 ENCOUNTER — Ambulatory Visit (INDEPENDENT_AMBULATORY_CARE_PROVIDER_SITE_OTHER): Payer: Federal, State, Local not specified - PPO | Admitting: Obstetrics and Gynecology

## 2016-05-02 ENCOUNTER — Encounter: Payer: Self-pay | Admitting: Obstetrics and Gynecology

## 2016-05-02 VITALS — BP 110/70 | HR 78 | Resp 12 | Ht 62.75 in | Wt 201.8 lb

## 2016-05-02 DIAGNOSIS — Z01419 Encounter for gynecological examination (general) (routine) without abnormal findings: Secondary | ICD-10-CM | POA: Diagnosis not present

## 2016-05-02 NOTE — Patient Instructions (Signed)

## 2017-02-21 ENCOUNTER — Other Ambulatory Visit: Payer: Self-pay | Admitting: Family Medicine

## 2017-02-21 DIAGNOSIS — R921 Mammographic calcification found on diagnostic imaging of breast: Secondary | ICD-10-CM

## 2017-04-02 ENCOUNTER — Ambulatory Visit
Admission: RE | Admit: 2017-04-02 | Discharge: 2017-04-02 | Disposition: A | Discharge status: Home / Self Care | Payer: Federal, State, Local not specified - PPO | Source: Ambulatory Visit | Attending: Family Medicine | Admitting: Family Medicine

## 2017-04-02 DIAGNOSIS — R921 Mammographic calcification found on diagnostic imaging of breast: Secondary | ICD-10-CM

## 2017-04-28 ENCOUNTER — Ambulatory Visit: Payer: Federal, State, Local not specified - PPO | Admitting: Podiatry

## 2017-04-28 ENCOUNTER — Encounter: Payer: Self-pay | Admitting: Podiatry

## 2017-04-28 VITALS — BP 134/82 | HR 70 | Resp 16

## 2017-04-28 DIAGNOSIS — L600 Ingrowing nail: Secondary | ICD-10-CM | POA: Diagnosis not present

## 2017-04-28 DIAGNOSIS — B351 Tinea unguium: Secondary | ICD-10-CM

## 2017-04-28 NOTE — Patient Instructions (Signed)

## 2017-04-28 NOTE — Progress Notes (Signed)
Subjective:   Patient ID: Debbie Castro, female   DOB: 57 y.o.   MRN: [id number]   HPI Patient presents stating that she is been on and off antibiotics for several months with chronic ingrown toenail of the right big toe that makes it hard for her to wear shoe gear comfortably.  States that it has been a problem for her for a while and she is tried to trim it without relief and she also has slight discoloration in her left big toe with a negative fungal culture.  Patient does not smoke and likes to be active   Review of Systems  All other systems reviewed and are negative.       Objective:  Physical Exam  Constitutional: She appears well-developed and well-nourished.  Cardiovascular: Intact distal pulses.  Pulmonary/Chest: Effort normal.  Musculoskeletal: Normal range of motion.  Neurological: She is alert.  Skin: Skin is warm.  Nursing note and vitals reviewed.   Neurovascular status intact muscle strength adequate range of motion within normal limits with patient found to have ingrown toenail deformity right hallux lateral border that is painful when pressed and making shoe gear difficult.  It is localized with no proximal edema erythema or drainage noted and also no distal drainage noted.  It is painful when palpated and patient does have slight discoloration of the left hallux nail     Assessment:  Ingrown toenail deformity right hallux lateral border with pain and probable traumatized left hallux nail     Plan:  H&P condition reviewed and recommended correction of the nail border explaining procedure and risk.  Patient read consent form and signed understanding risk and today I infiltrated the right hallux 60 mg like Marcaine mixture 0 prep applied to the right big toe with sterile instrumentation remove the lateral border exposed matrix and applied phenol 3 applications 30 seconds followed by alcohol lavage and sterile dressing.  Instructed on soaks and reappoint

## 2017-05-07 ENCOUNTER — Ambulatory Visit: Payer: Federal, State, Local not specified - PPO | Admitting: Obstetrics and Gynecology

## 2017-05-22 ENCOUNTER — Ambulatory Visit: Payer: Federal, State, Local not specified - PPO | Admitting: Obstetrics and Gynecology

## 2018-04-02 ENCOUNTER — Other Ambulatory Visit: Payer: Self-pay

## 2018-04-02 ENCOUNTER — Ambulatory Visit (INDEPENDENT_AMBULATORY_CARE_PROVIDER_SITE_OTHER): Payer: Federal, State, Local not specified - PPO | Admitting: Orthopaedic Surgery

## 2018-04-02 ENCOUNTER — Encounter (INDEPENDENT_AMBULATORY_CARE_PROVIDER_SITE_OTHER): Payer: Self-pay | Admitting: Orthopaedic Surgery

## 2018-04-02 ENCOUNTER — Ambulatory Visit (INDEPENDENT_AMBULATORY_CARE_PROVIDER_SITE_OTHER): Payer: Federal, State, Local not specified - PPO

## 2018-04-02 DIAGNOSIS — M7062 Trochanteric bursitis, left hip: Secondary | ICD-10-CM | POA: Diagnosis not present

## 2018-04-02 MED ORDER — BUPIVACAINE HCL 0.25 % IJ SOLN
0.6600 mL | INTRAMUSCULAR | Status: AC | PRN
  Administered 2018-04-02: .66 mL via INTRA_ARTICULAR

## 2018-04-02 MED ORDER — METHYLPREDNISOLONE ACETATE 40 MG/ML IJ SUSP
13.3300 mg | INTRAMUSCULAR | Status: AC | PRN
  Administered 2018-04-02: 13.33 mg via INTRA_ARTICULAR

## 2018-04-02 MED ORDER — LIDOCAINE HCL 1 % IJ SOLN
0.5000 mL | INTRAMUSCULAR | Status: AC | PRN
  Administered 2018-04-02: .5 mL

## 2018-04-02 NOTE — Progress Notes (Signed)
Office Visit Note   Patient: Debbie Castro           Date of Birth: September 06, 1960           MRN: [id number] Visit Date: 04/02/2018              Requested by: Derinda Late, MD [address] [place], [place] [address] PCP: Derinda Late, MD   Assessment & Plan: Visit Diagnoses:  1. Trochanteric bursitis of left hip     Plan: Impression is traumatic trochanteric bursitis left hip.  We will inject this with cortisone today.  I have discussed with her that it may take more than 1 injection to relieve her symptoms.  I have also provided her with an iliotibial band exercise program.  She will follow-up with [place] as needed.  Follow-Up Instructions: Return if symptoms worsen or fail to improve.   Orders:  Orders Placed This Encounter  Procedures  . Large Joint Inj: L greater trochanter  . XR HIP UNILAT W OR W/O PELVIS 2-3 VIEWS LEFT   No orders of the defined types were placed in this encounter.     Procedures: Large Joint Inj: L greater trochanter on 04/02/2018 8:29 AM Medications: 0.66 mL bupivacaine 0.25 %; 0.5 mL lidocaine 1 %; 13.33 mg methylPREDNISolone acetate 40 MG/ML      Clinical Data: No additional findings.   Subjective: Chief Complaint  Patient presents with  . Left Hip - Pain    HPI patient is a pleasant 58 year old female who presents our clinic today with left lateral hip pain.  This began approximately 2 to 3 weeks ago when she slipped and fell landing on the lateral aspect of her left hip.  Since then she has had left lateral hip pain.  No pain to the groin or anterior thigh.  The pain has not improved or worsened over the past few weeks.  Pain is worse going from a seated to standing position as well as going up steps.  She has been taking naproxen with mild relief of symptoms.  No numbness, tingling or burning.  No significant weakness.  Review of Systems as detailed in HPI.  All others reviewed and are negative.   Objective: Vital Signs: There  were no vitals taken for this visit.  Physical Exam well-developed well-nourished female in no acute distress.  Alert and oriented x3.  Ortho Exam examination of the left hip reveals a negative logroll.  Negative straight leg raise.  Marked tenderness over the greater trochanter.  She is neurovascularly intact distally.  Specialty Comments:  No specialty comments available.  Imaging: Xr Hip Unilat W Or W/o Pelvis 2-3 Views Left  Result Date: 04/02/2018 No acute or structural abnormalities.  Moderate degenerative changes to the left hip joint.    PMFS History: Patient Active Problem List   Diagnosis Date Noted  . Trochanteric bursitis of left hip 04/02/2018   Past Medical History:  Diagnosis Date  . Allergy    mold  . Anxiety    occasional  . HSV-1 infection    Fever blisters.  . Hyperlipidemia     Family History  Problem Relation Age of Onset  . Glaucoma Mother        loss of vision left eye  . Cancer Maternal Grandfather   . Colon cancer Neg Hx   . Esophageal cancer Neg Hx   . Stomach cancer Neg Hx     Past Surgical History:  Procedure Laterality Date  . [place]KLE  SURGERY  1998   right  . TOTAL ABDOMINAL HYSTERECTOMY  2005   still has ovaries  . [place] EXTRACTION  1982   Social History   Occupational History  . Not on file  Tobacco Use  . Smoking status: Never Smoker  . Smokeless tobacco: Never Used  Substance and Sexual Activity  . Alcohol use: Yes    Alcohol/week: 1.0 standard drinks    Types: 1 Cans of beer per week  . Drug use: No  . Sexual activity: Yes    Partners: Male    Birth control/protection: Surgical

## 2018-08-24 DIAGNOSIS — E119 Type 2 diabetes mellitus without complications: Secondary | ICD-10-CM | POA: Diagnosis not present

## 2018-09-01 DIAGNOSIS — R300 Dysuria: Secondary | ICD-10-CM | POA: Diagnosis not present

## 2018-10-13 ENCOUNTER — Other Ambulatory Visit: Payer: Self-pay | Admitting: Family Medicine

## 2018-10-13 DIAGNOSIS — L570 Actinic keratosis: Secondary | ICD-10-CM | POA: Diagnosis not present

## 2018-10-13 DIAGNOSIS — Z1231 Encounter for screening mammogram for malignant neoplasm of breast: Secondary | ICD-10-CM

## 2018-10-13 DIAGNOSIS — Z85828 Personal history of other malignant neoplasm of skin: Secondary | ICD-10-CM | POA: Diagnosis not present

## 2018-10-13 DIAGNOSIS — L821 Other seborrheic keratosis: Secondary | ICD-10-CM | POA: Diagnosis not present

## 2018-10-13 DIAGNOSIS — L817 Pigmented purpuric dermatosis: Secondary | ICD-10-CM | POA: Diagnosis not present

## 2018-10-19 ENCOUNTER — Ambulatory Visit
Admission: RE | Admit: 2018-10-19 | Discharge: 2018-10-19 | Disposition: A | Discharge status: Home / Self Care | Payer: Federal, State, Local not specified - PPO | Source: Ambulatory Visit | Attending: Family Medicine | Admitting: Family Medicine

## 2018-10-19 ENCOUNTER — Other Ambulatory Visit: Payer: Self-pay | Admitting: Family Medicine

## 2018-10-19 ENCOUNTER — Other Ambulatory Visit: Payer: Self-pay

## 2018-10-19 DIAGNOSIS — N63 Unspecified lump in unspecified breast: Secondary | ICD-10-CM

## 2018-10-19 DIAGNOSIS — Z1231 Encounter for screening mammogram for malignant neoplasm of breast: Secondary | ICD-10-CM

## 2018-10-20 DIAGNOSIS — Z23 Encounter for immunization: Secondary | ICD-10-CM | POA: Diagnosis not present

## 2018-10-27 ENCOUNTER — Ambulatory Visit
Admission: RE | Admit: 2018-10-27 | Discharge: 2018-10-27 | Disposition: A | Discharge status: Home / Self Care | Payer: Federal, State, Local not specified - PPO | Source: Ambulatory Visit | Attending: Family Medicine | Admitting: Family Medicine

## 2018-10-27 ENCOUNTER — Other Ambulatory Visit: Payer: Self-pay

## 2018-10-27 DIAGNOSIS — N644 Mastodynia: Secondary | ICD-10-CM | POA: Diagnosis not present

## 2018-10-27 DIAGNOSIS — R922 Inconclusive mammogram: Secondary | ICD-10-CM | POA: Diagnosis not present

## 2018-10-27 DIAGNOSIS — N63 Unspecified lump in unspecified breast: Secondary | ICD-10-CM

## 2018-11-12 ENCOUNTER — Other Ambulatory Visit: Payer: Self-pay

## 2018-11-12 ENCOUNTER — Ambulatory Visit: Payer: Federal, State, Local not specified - PPO | Admitting: Obstetrics and Gynecology

## 2018-11-12 ENCOUNTER — Encounter: Payer: Self-pay | Admitting: Obstetrics and Gynecology

## 2018-11-12 VITALS — BP 122/80 | HR 90 | Temp 97.3°F | Resp 14 | Ht 62.75 in | Wt 229.2 lb

## 2018-11-12 DIAGNOSIS — Z01419 Encounter for gynecological examination (general) (routine) without abnormal findings: Secondary | ICD-10-CM | POA: Diagnosis not present

## 2018-11-12 NOTE — Patient Instructions (Signed)

## 2018-11-12 NOTE — Progress Notes (Signed)
58 y.o. G74P2012 Married Caucasian female here for annual exam.    She had some sensitivity in her left breast prior to her mammogram, and her PCP ordered a dx study. Result was BI-RIADS1.   Taking vit D.   Bladder with some leakage with coughing, laughing or waiting too long to void.  Wears a pad daily.   Working from home during the pandemic.  Works for [employer].  PCP:   Derinda Late, MD  No LMP recorded. Patient has had a hysterectomy.           Sexually active: Yes.    The current method of family planning is status post hysterectomy.    Exercising: No.  The patient does not participate in regular exercise at present. Smoker:  no  Health Maintenance: Pap: 2005 normal History of abnormal Pap:  no MMG: 10-27-18 Diag.Bil.& Lt.US--Neg/density C/BiRads1 Colonoscopy: 02-08-2011 Normal with mild diverticulosis with Dr.Stark;next due 01/2021.   BMD:  02-21-15 Result :Normal TDaP:  PCP Gardasil:   no HIV: Neg with PCP Hep C:Neg with PCP Screening Labs:  PCP.  Flu vaccine:  Completed.   reports that she has never smoked. She has never used smokeless tobacco. She reports previous alcohol use. She reports that she does not use drugs.  Past Medical History:  Diagnosis Date  . Allergy    mold  . Anxiety    occasional  . Diabetes mellitus without complication ([place])    prediabetic per PCP  . HSV-1 infection    Fever blisters.  . Hyperlipidemia     Past Surgical History:  Procedure Laterality Date  . [place]   right  . TOTAL ABDOMINAL HYSTERECTOMY  2005   still has ovaries  . WISDOM TOOTH EXTRACTION  1982    Current Outpatient Medications  Medication Sig Dispense Refill  . acyclovir (ZOVIRAX) 400 MG tablet Take 400 mg by mouth as needed.     . ALPRAZolam (XANAX) 0.5 MG tablet Take 0.25 tablets by mouth daily. Rarely takes for anxiety    . cetirizine (ZYRTEC) 10 MG tablet Take 10 mg by mouth as needed. allergies    . metFORMIN (GLUCOPHAGE-XR) 500 MG 24 hr  tablet Take 3 tablets by mouth daily.    . naproxen sodium (ANAPROX) 220 MG tablet Take 440 mg by mouth as needed. Aches and pain    . rosuvastatin (CRESTOR) 40 MG tablet Take 1 tablet by mouth daily.    Marland Kitchen triamterene-hydrochlorothiazide (MAXZIDE-25) 37.5-25 MG per tablet Take 1 tablet by mouth daily.     No current facility-administered medications for this visit.     Family History  Problem Relation Age of Onset  . Glaucoma Mother        loss of vision left eye  . Cancer Maternal Grandfather   . Colon cancer Neg Hx   . Esophageal cancer Neg Hx   . Stomach cancer Neg Hx   . Breast cancer Neg Hx     Review of Systems  All other systems reviewed and are negative.   Exam:   BP 122/80 (Cuff Size: Large)   Pulse 90   Temp (!) 97.3 F (36.3 C) (Temporal)   Resp 14   Ht 5' 2.75" (1.594 m)   Wt 229 lb 3.2 oz (104 kg)   BMI 40.93 kg/m     General appearance: alert, cooperative and appears stated age Head: normocephalic, without obvious abnormality, atraumatic Neck: no adenopathy, supple, symmetrical, trachea midline and thyroid normal to inspection and  palpation Lungs: clear to auscultation bilaterally Breasts: normal appearance, no masses or tenderness, No nipple retraction or dimpling, No nipple discharge or bleeding, No axillary adenopathy Heart: regular rate and rhythm Abdomen: soft, non-tender; no masses, no organomegaly Extremities: extremities normal, atraumatic, no cyanosis or edema Skin: skin color, texture, turgor normal. No rashes or lesions Lymph nodes: cervical, supraclavicular, and axillary nodes normal. Neurologic: grossly normal  Pelvic: External genitalia:  no lesions              No abnormal inguinal nodes palpated.              Urethra:  normal appearing urethra with no masses, tenderness or lesions              Bartholins and Skenes: normal                 Vagina: normal appearing vagina with normal color and discharge, no lesions              Cervix:  absent              Pap taken: No. Bimanual Exam:  Uterus:  absent.              Adnexa: no mass, fullness, tenderness              Rectal exam: Yes.  .  Confirms.              Anus:  normal sphincter tone, no lesions  Chaperone was present for exam.  Assessment:   Well woman visit with normal exam. Status post TAH. Ovaries remain.  GSI.  Hx HSV 1.   Plan: Mammogram screening discussed. Self breast awareness reviewed. Pap and HR HPV not needed. Guidelines for Calcium, Vitamin D, regular exercise program including cardiovascular and weight bearing exercise. We discussed pelvic floor PT, pessary, weight loss, and midurethral sling for stress incontinence.  Labs with PCP.  Follow up annually and prn.   After visit summary provided.

## 2018-12-17 DIAGNOSIS — Z0000 Encounter for general adult medical examination without abnormal findings: Secondary | ICD-10-CM | POA: Diagnosis not present

## 2018-12-24 DIAGNOSIS — Z85828 Personal history of other malignant neoplasm of skin: Secondary | ICD-10-CM | POA: Diagnosis not present

## 2018-12-24 DIAGNOSIS — D224 Melanocytic nevi of scalp and neck: Secondary | ICD-10-CM | POA: Diagnosis not present

## 2018-12-24 DIAGNOSIS — L821 Other seborrheic keratosis: Secondary | ICD-10-CM | POA: Diagnosis not present

## 2018-12-24 DIAGNOSIS — L570 Actinic keratosis: Secondary | ICD-10-CM | POA: Diagnosis not present

## 2018-12-24 DIAGNOSIS — D1801 Hemangioma of skin and subcutaneous tissue: Secondary | ICD-10-CM | POA: Diagnosis not present

## 2018-12-25 DIAGNOSIS — G43009 Migraine without aura, not intractable, without status migrainosus: Secondary | ICD-10-CM | POA: Diagnosis not present

## 2018-12-25 DIAGNOSIS — E119 Type 2 diabetes mellitus without complications: Secondary | ICD-10-CM | POA: Diagnosis not present

## 2018-12-25 DIAGNOSIS — Z0000 Encounter for general adult medical examination without abnormal findings: Secondary | ICD-10-CM | POA: Diagnosis not present

## 2018-12-25 DIAGNOSIS — E663 Overweight: Secondary | ICD-10-CM | POA: Diagnosis not present

## 2018-12-25 DIAGNOSIS — I10 Essential (primary) hypertension: Secondary | ICD-10-CM | POA: Diagnosis not present

## 2019-01-13 ENCOUNTER — Other Ambulatory Visit: Payer: Self-pay | Admitting: Family Medicine

## 2019-01-13 DIAGNOSIS — E2839 Other primary ovarian failure: Secondary | ICD-10-CM

## 2019-02-17 ENCOUNTER — Other Ambulatory Visit: Payer: Federal, State, Local not specified - PPO

## 2019-03-01 ENCOUNTER — Other Ambulatory Visit: Payer: Self-pay

## 2019-03-01 ENCOUNTER — Ambulatory Visit
Admission: RE | Admit: 2019-03-01 | Discharge: 2019-03-01 | Disposition: A | Discharge status: Home / Self Care | Payer: Federal, State, Local not specified - PPO | Source: Ambulatory Visit | Attending: Family Medicine | Admitting: Family Medicine

## 2019-03-01 DIAGNOSIS — Z13820 Encounter for screening for osteoporosis: Secondary | ICD-10-CM | POA: Diagnosis not present

## 2019-03-01 DIAGNOSIS — Z780 Asymptomatic menopausal state: Secondary | ICD-10-CM | POA: Diagnosis not present

## 2019-03-01 DIAGNOSIS — E2839 Other primary ovarian failure: Secondary | ICD-10-CM

## 2019-03-26 DIAGNOSIS — R7401 Elevation of levels of liver transaminase levels: Secondary | ICD-10-CM | POA: Diagnosis not present

## 2019-03-27 ENCOUNTER — Ambulatory Visit: Payer: Federal, State, Local not specified - PPO

## 2019-03-29 ENCOUNTER — Ambulatory Visit: Payer: Federal, State, Local not specified - PPO | Attending: Internal Medicine

## 2019-03-29 DIAGNOSIS — Z23 Encounter for immunization: Secondary | ICD-10-CM

## 2019-03-29 NOTE — Progress Notes (Signed)
   Covid-19 Vaccination Clinic  Name:  Debbie Castro    MRN: [id number] DOB: [birth year]  03/29/2019  [name] was observed post Covid-19 immunization for 15 minutes without incident. She was provided with Vaccine Information Sheet and instruction to access the V-Safe system.   [name] was instructed to call 911 with any severe reactions post vaccine: Marland Kitchen Difficulty breathing  . Swelling of face and throat  . A fast heartbeat  . A bad rash all over body  . Dizziness and weakness   Immunizations Administered    Name Date Dose VIS Date Route   Pfizer COVID-19 Vaccine 03/29/2019  4:33 PM 0.3 mL 12/25/2018 Intramuscular   Manufacturer: [place]   Lot: [id number]   [place]: [id number]

## 2019-04-21 ENCOUNTER — Ambulatory Visit: Payer: Federal, State, Local not specified - PPO | Attending: Internal Medicine

## 2019-04-21 DIAGNOSIS — Z23 Encounter for immunization: Secondary | ICD-10-CM

## 2019-04-21 NOTE — Progress Notes (Signed)
   Covid-19 Vaccination Clinic  Name:  Debbie Castro    MRN: [id number] DOB: Dec 13, 1960  04/21/2019  Ms. Stride was observed post Covid-19 immunization for 15 minutes without incident. She was provided with Vaccine Information Sheet and instruction to access the V-Safe system.   [name] was instructed to call 911 with any severe reactions post vaccine: Marland Kitchen Difficulty breathing  . Swelling of face and throat  . A fast heartbeat  . A bad rash all over body  . Dizziness and weakness   Immunizations Administered    Name Date Dose VIS Date Route   Pfizer COVID-19 Vaccine 04/21/2019  4:36 PM 0.3 mL 12/25/2018 Intramuscular   Manufacturer: [place]   Lot: [id number]   [place]: [id number]

## 2019-10-06 ENCOUNTER — Other Ambulatory Visit: Payer: Self-pay | Admitting: Family Medicine

## 2019-10-06 DIAGNOSIS — Z1231 Encounter for screening mammogram for malignant neoplasm of breast: Secondary | ICD-10-CM

## 2019-10-27 DIAGNOSIS — E119 Type 2 diabetes mellitus without complications: Secondary | ICD-10-CM | POA: Diagnosis not present

## 2019-10-28 ENCOUNTER — Other Ambulatory Visit: Payer: Self-pay

## 2019-10-28 ENCOUNTER — Ambulatory Visit
Admission: RE | Admit: 2019-10-28 | Discharge: 2019-10-28 | Disposition: A | Discharge status: Home / Self Care | Payer: Federal, State, Local not specified - PPO | Source: Ambulatory Visit | Attending: Family Medicine | Admitting: Family Medicine

## 2019-10-28 DIAGNOSIS — Z1231 Encounter for screening mammogram for malignant neoplasm of breast: Secondary | ICD-10-CM

## 2019-11-16 ENCOUNTER — Ambulatory Visit: Payer: Federal, State, Local not specified - PPO | Admitting: Obstetrics and Gynecology

## 2019-12-27 DIAGNOSIS — R1030 Lower abdominal pain, unspecified: Secondary | ICD-10-CM | POA: Diagnosis not present

## 2019-12-27 DIAGNOSIS — D1801 Hemangioma of skin and subcutaneous tissue: Secondary | ICD-10-CM | POA: Diagnosis not present

## 2019-12-27 DIAGNOSIS — Z0000 Encounter for general adult medical examination without abnormal findings: Secondary | ICD-10-CM | POA: Diagnosis not present

## 2019-12-27 DIAGNOSIS — E782 Mixed hyperlipidemia: Secondary | ICD-10-CM | POA: Diagnosis not present

## 2019-12-27 DIAGNOSIS — I10 Essential (primary) hypertension: Secondary | ICD-10-CM | POA: Diagnosis not present

## 2019-12-27 DIAGNOSIS — Z85828 Personal history of other malignant neoplasm of skin: Secondary | ICD-10-CM | POA: Diagnosis not present

## 2019-12-27 DIAGNOSIS — L821 Other seborrheic keratosis: Secondary | ICD-10-CM | POA: Diagnosis not present

## 2019-12-27 DIAGNOSIS — E119 Type 2 diabetes mellitus without complications: Secondary | ICD-10-CM | POA: Diagnosis not present

## 2019-12-27 DIAGNOSIS — B351 Tinea unguium: Secondary | ICD-10-CM | POA: Diagnosis not present

## 2020-03-14 ENCOUNTER — Ambulatory Visit: Payer: Federal, State, Local not specified - PPO | Admitting: Obstetrics and Gynecology

## 2020-03-24 DIAGNOSIS — E782 Mixed hyperlipidemia: Secondary | ICD-10-CM | POA: Diagnosis not present

## 2020-05-17 NOTE — Progress Notes (Signed)
60 y.o. G3P2012 Married Caucasian female here for annual exam.    Would like help with weight loss.  Declines surgical care for this.   Still with urinary incontinence.  Would like assistance with this also.  Working from home.   Received her first Covid booster.  Thinks she had a reaction in her arm to the booster and developed redness and swelling.   PCP:  Derinda Late, MD  No LMP recorded. Patient has had a hysterectomy.           Sexually active: Yes.    The current method of family planning is status post hysterectomy.    Exercising: No.  The patient does not participate in regular exercise at present. Smoker:  no --walks dog nightly  Health Maintenance: Pap: 2005 normal History of abnormal Pap:  no MMG: 10-28-19 3D/Neg/BiRads1 Colonoscopy:  01/2011 normal;next 10 years BMD:  03-01-19 Result :Normal TDaP: PCP Gardasil:   no HIV: Neg with PCP Hep C: Neg with PCP Screening Labs:  PCP.   reports that she has never smoked. She has never used smokeless tobacco. She reports previous alcohol use. She reports that she does not use drugs.  Past Medical History:  Diagnosis Date  . Allergy    mold  . Anxiety    occasional  . Diabetes mellitus without complication ([place])    prediabetic per PCP  . HSV-1 infection    Fever blisters.  . Hyperlipidemia     Past Surgical History:  Procedure Laterality Date  . [place]   right  . TOTAL ABDOMINAL HYSTERECTOMY  2005   still has ovaries  . WISDOM TOOTH EXTRACTION  1982    Current Outpatient Medications  Medication Sig Dispense Refill  . acyclovir (ZOVIRAX) 400 MG tablet Take 400 mg by mouth as needed.     . ALPRAZolam (XANAX) 0.5 MG tablet Take 0.25 tablets by mouth daily. Rarely takes for anxiety    . cetirizine (ZYRTEC) 10 MG tablet Take 10 mg by mouth as needed. allergies    . cholecalciferol (VITAMIN D3) 25 MCG (1000 UNIT) tablet Take 1,000 Units by mouth daily.    Marland Kitchen CRANBERRY PO Take 1 tablet by mouth  at bedtime.    Marland Kitchen ezetimibe (ZETIA) 10 MG tablet Take 10 mg by mouth daily.    . metFORMIN (GLUCOPHAGE-XR) 500 MG 24 hr tablet Take 3 tablets by mouth daily.    . naproxen sodium (ANAPROX) 220 MG tablet Take 440 mg by mouth as needed. Aches and pain    . rosuvastatin (CRESTOR) 40 MG tablet Take 1 tablet by mouth daily.    Marland Kitchen triamterene-hydrochlorothiazide (MAXZIDE-25) 37.5-25 MG per tablet Take 1 tablet by mouth daily.     No current facility-administered medications for this visit.    Family History  Problem Relation Age of Onset  . Glaucoma Mother        loss of vision left eye  . Stroke Father   . Cancer Maternal Grandfather   . Colon cancer Neg Hx   . Esophageal cancer Neg Hx   . Stomach cancer Neg Hx   . Breast cancer Neg Hx     Review of Systems  All other systems reviewed and are negative.   Exam:   BP 132/70 (Cuff Size: Large)   Pulse 93   Ht 5\' 3"  (1.6 m)   Wt 230 lb (104.3 kg)   SpO2 98%   BMI 40.74 kg/m     General appearance: alert, cooperative  and appears stated age Head: normocephalic, without obvious abnormality, atraumatic Neck: no adenopathy, supple, symmetrical, trachea midline and thyroid normal to inspection and palpation Lungs: clear to auscultation bilaterally Breasts: normal appearance, no masses or tenderness, No nipple retraction or dimpling, No nipple discharge or bleeding, No axillary adenopathy Heart: regular rate and rhythm Abdomen: soft, non-tender; no masses, no organomegaly Extremities: extremities normal, atraumatic, no cyanosis or edema Skin: skin color, texture, turgor normal. No rashes or lesions Lymph nodes: cervical, supraclavicular, and axillary nodes normal. Neurologic: grossly normal  Pelvic: External genitalia:  no lesions              No abnormal inguinal nodes palpated.              Urethra:  normal appearing urethra with no masses, tenderness or lesions              Bartholins and Skenes: normal                 Vagina:  normal appearing vagina with normal color and discharge, no lesions              Cervix: absent              Pap taken: No. Bimanual Exam:  Uterus:  absent              Adnexa: no mass, fullness, tenderness              Rectal exam: Yes.  .  Confirms.              Anus:  normal sphincter tone, no lesions  Chaperone was present for exam.  Assessment:   Well woman visit with normal exam. Status post TAH. Ovaries remain.  GSI. Hx HSV 1.  BMI 40.74.  Plan: Mammogram screening discussed. Self breast awareness reviewed. Pap and HR HPV as above. Guidelines for Calcium, Vitamin D, regular exercise program including cardiovascular and weight bearing exercise. Referral for weight management.  Referral for pelvic floor therapy.    Follow up annually and prn.

## 2020-05-18 ENCOUNTER — Telehealth: Payer: Self-pay | Admitting: Obstetrics and Gynecology

## 2020-05-18 ENCOUNTER — Encounter: Payer: Self-pay | Admitting: Obstetrics and Gynecology

## 2020-05-18 ENCOUNTER — Ambulatory Visit (INDEPENDENT_AMBULATORY_CARE_PROVIDER_SITE_OTHER): Payer: Federal, State, Local not specified - PPO | Admitting: Obstetrics and Gynecology

## 2020-05-18 ENCOUNTER — Other Ambulatory Visit: Payer: Self-pay

## 2020-05-18 VITALS — BP 132/70 | HR 93 | Ht 63.0 in | Wt 230.0 lb

## 2020-05-18 DIAGNOSIS — Z6841 Body Mass Index (BMI) 40.0 and over, adult: Secondary | ICD-10-CM

## 2020-05-18 DIAGNOSIS — N393 Stress incontinence (female) (male): Secondary | ICD-10-CM | POA: Diagnosis not present

## 2020-05-18 DIAGNOSIS — Z01419 Encounter for gynecological examination (general) (routine) without abnormal findings: Secondary | ICD-10-CM | POA: Diagnosis not present

## 2020-05-18 NOTE — Telephone Encounter (Signed)
Please make a referral to [facility] for pelvic floor therapy for stress incontinence.

## 2020-05-18 NOTE — Patient Instructions (Signed)

## 2020-05-22 DIAGNOSIS — Z03818 Encounter for observation for suspected exposure to other biological agents ruled out: Secondary | ICD-10-CM | POA: Diagnosis not present

## 2020-05-22 DIAGNOSIS — U071 COVID-19: Secondary | ICD-10-CM | POA: Diagnosis not present

## 2020-05-22 NOTE — Telephone Encounter (Signed)
Referral faxed to [facility] they will call to schedule, patient aware of this as well.

## 2020-05-30 NOTE — Telephone Encounter (Signed)
Scheduled on 06/30/20 with Dr.Herrick

## 2020-06-01 ENCOUNTER — Telehealth: Payer: Self-pay

## 2020-06-01 DIAGNOSIS — Z6841 Body Mass Index (BMI) 40.0 and over, adult: Secondary | ICD-10-CM

## 2020-06-01 NOTE — Telephone Encounter (Signed)
Per Dr. Quincy Simmonds 05/18/20 office note "Referral for weight management."

## 2020-06-01 NOTE — Telephone Encounter (Signed)
Patient called back. She said it was not the urology referral she was inquring about. SHe was aware of that appt.  I see her in chart Dr. Quincy Simmonds disussed referring her to weight loss center. Patient confirms that is what she is calling about. I advised her I will route to [facility] and ask for referral.

## 2020-06-01 NOTE — Telephone Encounter (Signed)
Patient called about this referral. Informed of date and time on 06/30/20 at  2:30pm with Dr. Louis Meckel per JW/ka  (Message left in her voice mail)

## 2020-06-01 NOTE — Telephone Encounter (Signed)
Referral placed at [facility].

## 2020-06-02 ENCOUNTER — Other Ambulatory Visit: Payer: Self-pay | Admitting: Family Medicine

## 2020-06-02 ENCOUNTER — Ambulatory Visit
Admission: RE | Admit: 2020-06-02 | Discharge: 2020-06-02 | Disposition: A | Discharge status: Home / Self Care | Payer: Federal, State, Local not specified - PPO | Source: Ambulatory Visit | Attending: Family Medicine | Admitting: Family Medicine

## 2020-06-02 DIAGNOSIS — M7989 Other specified soft tissue disorders: Secondary | ICD-10-CM | POA: Diagnosis not present

## 2020-06-02 DIAGNOSIS — S8011XA Contusion of right lower leg, initial encounter: Secondary | ICD-10-CM | POA: Diagnosis not present

## 2020-06-02 DIAGNOSIS — R609 Edema, unspecified: Secondary | ICD-10-CM

## 2020-06-02 DIAGNOSIS — M79651 Pain in right thigh: Secondary | ICD-10-CM | POA: Diagnosis not present

## 2020-06-02 DIAGNOSIS — T148XXA Other injury of unspecified body region, initial encounter: Secondary | ICD-10-CM

## 2020-06-05 ENCOUNTER — Ambulatory Visit (HOSPITAL_COMMUNITY)
Admission: RE | Admit: 2020-06-05 | Discharge: 2020-06-05 | Disposition: A | Discharge status: Home / Self Care | Payer: Federal, State, Local not specified - PPO | Source: Ambulatory Visit | Attending: Family Medicine | Admitting: Family Medicine

## 2020-06-05 ENCOUNTER — Other Ambulatory Visit (HOSPITAL_COMMUNITY): Payer: Self-pay | Admitting: Family Medicine

## 2020-06-05 ENCOUNTER — Other Ambulatory Visit: Payer: Self-pay

## 2020-06-05 DIAGNOSIS — M7989 Other specified soft tissue disorders: Secondary | ICD-10-CM | POA: Diagnosis not present

## 2020-06-05 NOTE — Telephone Encounter (Signed)
Patient aware [facility] will call to schedule.

## 2020-06-21 NOTE — Telephone Encounter (Signed)
Patient scheduled on 07/18/20 with Laqueta Linden, MD

## 2020-06-28 DIAGNOSIS — E119 Type 2 diabetes mellitus without complications: Secondary | ICD-10-CM | POA: Diagnosis not present

## 2020-06-30 DIAGNOSIS — N393 Stress incontinence (female) (male): Secondary | ICD-10-CM | POA: Diagnosis not present

## 2020-07-18 ENCOUNTER — Encounter (INDEPENDENT_AMBULATORY_CARE_PROVIDER_SITE_OTHER): Payer: Self-pay | Admitting: Family Medicine

## 2020-07-18 ENCOUNTER — Ambulatory Visit (INDEPENDENT_AMBULATORY_CARE_PROVIDER_SITE_OTHER): Payer: Federal, State, Local not specified - PPO | Admitting: Family Medicine

## 2020-07-18 ENCOUNTER — Other Ambulatory Visit: Payer: Self-pay

## 2020-07-18 VITALS — BP 134/82 | HR 69 | Temp 97.7°F | Ht 63.0 in | Wt 226.0 lb

## 2020-07-18 DIAGNOSIS — E559 Vitamin D deficiency, unspecified: Secondary | ICD-10-CM | POA: Diagnosis not present

## 2020-07-18 DIAGNOSIS — E785 Hyperlipidemia, unspecified: Secondary | ICD-10-CM | POA: Diagnosis not present

## 2020-07-18 DIAGNOSIS — Z1331 Encounter for screening for depression: Secondary | ICD-10-CM | POA: Diagnosis not present

## 2020-07-18 DIAGNOSIS — E1165 Type 2 diabetes mellitus with hyperglycemia: Secondary | ICD-10-CM | POA: Diagnosis not present

## 2020-07-18 DIAGNOSIS — R5383 Other fatigue: Secondary | ICD-10-CM

## 2020-07-18 DIAGNOSIS — Z9189 Other specified personal risk factors, not elsewhere classified: Secondary | ICD-10-CM | POA: Diagnosis not present

## 2020-07-18 DIAGNOSIS — E1159 Type 2 diabetes mellitus with other circulatory complications: Secondary | ICD-10-CM

## 2020-07-18 DIAGNOSIS — E1169 Type 2 diabetes mellitus with other specified complication: Secondary | ICD-10-CM | POA: Diagnosis not present

## 2020-07-18 DIAGNOSIS — R0602 Shortness of breath: Secondary | ICD-10-CM

## 2020-07-18 DIAGNOSIS — Z0289 Encounter for other administrative examinations: Secondary | ICD-10-CM

## 2020-07-18 DIAGNOSIS — I152 Hypertension secondary to endocrine disorders: Secondary | ICD-10-CM | POA: Diagnosis not present

## 2020-07-18 DIAGNOSIS — R6889 Other general symptoms and signs: Secondary | ICD-10-CM | POA: Diagnosis not present

## 2020-07-18 DIAGNOSIS — Z6841 Body Mass Index (BMI) 40.0 and over, adult: Secondary | ICD-10-CM

## 2020-07-19 LAB — CBC WITH DIFFERENTIAL/PLATELET
Basophils Absolute: 0.1 10*3/uL (ref 0.0–0.2)
Basos: 1 %
EOS (ABSOLUTE): 0.2 10*3/uL (ref 0.0–0.4)
Eos: 3 %
Hematocrit: 43 % (ref 34.0–46.6)
Hemoglobin: 13.6 g/dL (ref 11.1–15.9)
Immature Grans (Abs): 0 10*3/uL (ref 0.0–0.1)
Immature Granulocytes: 0 %
Lymphocytes Absolute: 2.3 10*3/uL (ref 0.7–3.1)
Lymphs: 32 %
MCH: 28.9 pg (ref 26.6–33.0)
MCHC: 31.6 g/dL (ref 31.5–35.7)
MCV: 91 fL (ref 79–97)
Monocytes Absolute: 0.5 10*3/uL (ref 0.1–0.9)
Monocytes: 7 %
Neutrophils Absolute: 4.1 10*3/uL (ref 1.4–7.0)
Neutrophils: 57 %
Platelets: 305 10*3/uL (ref 150–450)
RBC: 4.71 x10E6/uL (ref 3.77–5.28)
RDW: 12.2 % (ref 11.7–15.4)
WBC: 7.3 10*3/uL (ref 3.4–10.8)

## 2020-07-19 LAB — COMPREHENSIVE METABOLIC PANEL
ALT: 36 IU/L — ABNORMAL HIGH (ref 0–32)
AST: 26 IU/L (ref 0–40)
Albumin/Globulin Ratio: 1.6 (ref 1.2–2.2)
Albumin: 4.4 g/dL (ref 3.8–4.9)
Alkaline Phosphatase: 63 IU/L (ref 44–121)
BUN/Creatinine Ratio: 22 (ref 12–28)
BUN: 19 mg/dL (ref 8–27)
Bilirubin Total: 0.4 mg/dL (ref 0.0–1.2)
CO2: 26 mmol/L (ref 20–29)
Calcium: 9.9 mg/dL (ref 8.7–10.3)
Chloride: 99 mmol/L (ref 96–106)
Creatinine, Ser: 0.85 mg/dL (ref 0.57–1.00)
Globulin, Total: 2.7 g/dL (ref 1.5–4.5)
Glucose: 102 mg/dL — ABNORMAL HIGH (ref 65–99)
Potassium: 4.1 mmol/L (ref 3.5–5.2)
Sodium: 140 mmol/L (ref 134–144)
Total Protein: 7.1 g/dL (ref 6.0–8.5)
eGFR: 78 mL/min/{1.73_m2} (ref >59)

## 2020-07-19 LAB — LIPID PANEL WITH LDL/HDL RATIO
Cholesterol, Total: 83 mg/dL — ABNORMAL LOW (ref 100–199)
HDL: 34 mg/dL — ABNORMAL LOW (ref >39)
LDL Chol Calc (NIH): 29 mg/dL (ref 0–99)
LDL/HDL Ratio: 0.9 ratio (ref 0.0–3.2)
Triglycerides: 104 mg/dL (ref 0–149)
VLDL Cholesterol Cal: 20 mg/dL (ref 5–40)

## 2020-07-19 LAB — MICROALBUMIN / CREATININE URINE RATIO
Creatinine, Urine: 68.5 mg/dL
Microalb/Creat Ratio: 325 mg/g creat — ABNORMAL HIGH (ref 0–29)
Microalbumin, Urine: 222.5 ug/mL

## 2020-07-19 LAB — T3: T3, Total: 123 ng/dL (ref 71–180)

## 2020-07-19 LAB — VITAMIN B12: Vitamin B-12: 373 pg/mL (ref 232–1245)

## 2020-07-19 LAB — FOLATE: Folate: 12.5 ng/mL (ref >3.0)

## 2020-07-19 LAB — T4: T4, Total: 8 ug/dL (ref 4.5–12.0)

## 2020-07-19 LAB — VITAMIN D 25 HYDROXY (VIT D DEFICIENCY, FRACTURES): Vit D, 25-Hydroxy: 71.8 ng/mL (ref 30.0–100.0)

## 2020-07-19 LAB — INSULIN, RANDOM: INSULIN: 15.1 u[IU]/mL (ref 2.6–24.9)

## 2020-07-19 LAB — TSH: TSH: 1.23 u[IU]/mL (ref 0.450–4.500)

## 2020-07-24 NOTE — Progress Notes (Signed)
Dear Dr. Quincy Castro,   Thank you for referring Debbie Castro to our clinic. The following note includes my evaluation and treatment recommendations.  Chief Complaint:   Debbie Castro (MR# [id number]) is a 60 y.o. female who presents for evaluation and treatment of Debbie and related comorbidities. Current BMI is Body mass index is 40.03 kg/m. Debbie Castro has been struggling with her weight for many years and has been unsuccessful in either losing weight, maintaining weight loss, or reaching her healthy weight goal.  Debbie Castro is currently in the action stage of change and ready to dedicate time achieving and maintaining a healthier weight. Debbie Castro is interested in becoming our patient and working on intensive lifestyle modifications including (but not limited to) diet and exercise for weight loss.  Referred by Dr. Quincy Castro. Debbie Castro lives at home with her husband and is an Optometrist for [employer]. She has coffee in the morning with a couple of tbsp of creamer (half & half)- 16 oz; ~9AM, granola cereal (2 cups) + whole milk (uses 1 cup, doesn't drink it) (satisfied); 11AM- cookies- 4 of [employer] cranberry oatmeal with icing (satisfied); 12:30-1PM- leftovers or occasional [place] sandwich (3 slices), pepper jack cheese, chips out of a big bag of (Ruffles) (satisfied); 2-3 PM- wants snack- peanuts or sweet like candy bar; Dinner- roast in crock pot (8 oz chopped) + slaw from [employer] (1 cup) (felt full) + 2-3 hush puppies; After dinner- nutty buddies.  Debbie Castro's habits were reviewed today and are as follows: Her family eats meals together, her desired weight loss is 86 lbs, she started gaining weight in her late 62's and 40's, her heaviest weight ever was 230 pounds, she has significant food cravings issues, she snacks frequently in the evenings, she is frequently drinking liquids with calories, she frequently makes poor food choices, she has problems with excessive hunger, she frequently  eats larger portions than normal, she has binge eating behaviors, and she struggles with emotional eating.  Depression Screen Debbie Castro's Food and Mood (modified PHQ-9) score was 16.  Depression screen PHQ 2/9 07/18/2020  Decreased Interest 3  Down, Depressed, Hopeless 1  PHQ - 2 Score 4  Altered sleeping 3  Tired, decreased energy 3  Change in appetite 2  Feeling bad or failure about yourself  2  Trouble concentrating 1  Moving slowly or fidgety/restless 1  Suicidal thoughts 0  PHQ-9 Score 16  Difficult doing work/chores Not difficult at all   Subjective:   1. Other fatigue Debbie Castro admits to daytime somnolence and admits to waking up still tired. Patent has a history of symptoms of morning fatigue. Debbie Castro generally gets 7 hours of sleep per night, and states that she has poor sleep quality. Snoring is present. Apneic episodes are not present. Epworth Sleepiness Score is 2. EKG normal sinus rhythm at 75 bpm.  2. Shortness of breath on exertion Debbie Castro notes increasing shortness of breath with exercising and seems to be worsening over time with weight gain. She notes getting out of breath sooner with activity than she used to. This has gotten worse recently. Debbie Castro denies shortness of breath at rest or orthopnea. EKG normal sinus rhythm at 75 bpm.  3. Hypertension associated with diabetes (Debbie Castro) Debbie Castro's BP is controlled today. She was diagnosed years ago. Pt denies chest pain/chest pressure/headache. She is on triamterene-HCTZ.  4. Hyperlipidemia associated with type 2 diabetes mellitus (Debbie Castro) Debbie Castro was diagnosed a few years ago. She denies myalgias or transaminitis. She is on  rosuvastatin.  5. Type 2 diabetes mellitus with hyperglycemia, without long-term current use of insulin (Debbie Castro) New diagnosis. Debbie Castro is on Januvia and Metformin. Her recent A1c was >7.0. last eye exam was last year.   6. Vitamin D deficiency Debbie Castro is on OTC Vit D3 1,000 IU daily. She reports fatigue.  7. At risk  for deficient knowledge of diabetes mellitus Debbie Castro is at risk for deficient knowledge of diabetes mellitus.  Assessment/Plan:   1. Other fatigue Debbie Castro does feel that her weight is causing her energy to be lower than it should be. Fatigue may be related to Debbie, depression or many other causes. Labs will be ordered, and in the meanwhile, Kimiyah will focus on self care including making healthy food choices, increasing physical activity and focusing on stress reduction. Check labs today.  - Folate - EKG 12-Lead - CBC with Differential/Platelet - Vitamin B12 - T3 - T4 - TSH  2. Shortness of breath on exertion Debbie Castro does feel that she gets out of breath more easily that she used to when she exercises. Debbie Castro's shortness of breath appears to be Debbie related and exercise induced. She has agreed to work on weight loss and gradually increase exercise to treat her exercise induced shortness of breath. Will continue to monitor closely.  3. Hypertension associated with diabetes (Debbie Castro) Debbie Castro is working on healthy weight loss and exercise to improve blood pressure control. We will watch for signs of hypotension as she continues her lifestyle modifications. Check labs today.  - Comprehensive metabolic panel  4. Hyperlipidemia associated with type 2 diabetes mellitus (Debbie Castro) Cardiovascular risk and specific lipid/LDL goals reviewed.  We discussed several lifestyle modifications today and Debbie Castro will continue to work on diet, exercise and weight loss efforts. Orders and follow up as documented in patient record.   Counseling Intensive lifestyle modifications are the first line treatment for this issue. Dietary changes: Increase soluble fiber. Decrease simple carbohydrates. Exercise changes: Moderate to vigorous-intensity aerobic activity 150 minutes per week if tolerated. Lipid-lowering medications: see documented in medical record. Check labs today.  - Lipid Panel With LDL/HDL Ratio  5.  Type 2 diabetes mellitus with hyperglycemia, without long-term current use of insulin (Debbie Castro) Good blood sugar control is important to decrease the likelihood of diabetic complications such as nephropathy, neuropathy, limb loss, blindness, coronary artery disease, and death. Intensive lifestyle modification including diet, exercise and weight loss are the first line of treatment for diabetes.  Check labs today.  - Insulin, random - Microalbumin / creatinine urine ratio  6. Vitamin D deficiency Low Vitamin D level contributes to fatigue and are associated with Debbie, breast, and colon cancer. She agrees to continue to take OTC Vitamin D3 1,000 IU daily and will follow-up for routine testing of Vitamin D, at least 2-3 times per year to avoid over-replacement. Check labs today.  - VITAMIN D 25 Hydroxy (Vit-D Deficiency, Fractures)  7. Screening for depression Debbie Castro had a positive depression screening. Depression is commonly associated with Debbie and often results in emotional eating behaviors. We will monitor this closely and work on CBT to help improve the non-hunger eating patterns. Referral to Psychology may be required if no improvement is seen as she continues in our clinic.  8. At risk for deficient knowledge of diabetes mellitus Debbie Castro was given approximately 15 minutes of diabetes education and counseling today. We discussed intensive lifestyle modifications today with an emphasis on weight loss as well as increasing exercise and decreasing simple carbohydrates in her diet. We also  reviewed medication options with an emphasis on risk versus benefit of those discussed.   9. 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 I recommend Debbie Castro begin the structured treatment plan as follows:  She has agreed  to the Category 2 Plan + 100 calories.  Exercise goals: No exercise has been prescribed at this time.   Behavioral modification strategies: increasing lean protein intake, meal planning and cooking strategies, keeping healthy foods in the home, and planning for success.  She was informed of the importance of frequent follow-up visits to maximize her success with intensive lifestyle modifications for her multiple health conditions. She was informed we would discuss her lab results at her next visit unless there is a critical issue that needs to be addressed sooner. Debbie Castro agreed to keep her next visit at the agreed upon time to discuss these results.  Objective:   Blood pressure 134/82, pulse 69, temperature 97.7 F (36.5 C), height 5\' 3"  (1.6 m), weight 226 lb (102.5 kg), SpO2 97 %. Body mass index is 40.03 kg/m.  EKG: Normal sinus rhythm, rate 75.  Indirect Calorimeter completed today shows a VO2 of 237 and a REE of 1627.  Her calculated basal metabolic rate is 9024 thus her basal metabolic rate is worse than expected.  General: Cooperative, alert, well developed, in no acute distress. HEENT: Conjunctivae and lids unremarkable. Cardiovascular: Regular rhythm.  Lungs: Normal work of breathing. Neurologic: No focal deficits.   Lab Results  Component Value Date   CREATININE 0.85 07/18/2020   BUN 19 07/18/2020   NA 140 07/18/2020   K 4.1 07/18/2020   CL 99 07/18/2020   CO2 26 07/18/2020   Lab Results  Component Value Date   ALT 36 (H) 07/18/2020   AST 26 07/18/2020   ALKPHOS 63 07/18/2020   Debbie Castro 0.4 07/18/2020   No results found for: HGBA1C Lab Results  Component Value Date   INSULIN 15.1 07/18/2020   Lab Results  Component Value Date   TSH 1.230 07/18/2020   Lab Results  Component Value Date   CHOL 83 (L) 07/18/2020   HDL 34 (L) 07/18/2020   LDLCALC 29 07/18/2020   TRIG 104 07/18/2020   Lab Results  Component Value Date   WBC 7.3 07/18/2020   HGB 13.6  07/18/2020   HCT 43.0 07/18/2020   MCV 91 07/18/2020   PLT 305 07/18/2020   Attestation Statements:   Reviewed by clinician on day of visit: allergies, medications, problem list, medical history, surgical history, family history, social history, and previous encounter notes.  Coral Ceo, CMA, am acting as transcriptionist for Coralie Common, MD.  This is the patient's first visit at [facility]. The patient's NEW PATIENT PACKET was reviewed at length. Included in the packet: current and past health history, medications, allergies, ROS, gynecologic history (women only), surgical history, family history, social history, weight history, weight loss surgery history (for those that have had weight loss surgery), nutritional evaluation, mood and food questionnaire, PHQ9, Epworth questionnaire, sleep habits questionnaire, patient life and health improvement goals questionnaire. These will all be scanned into the patient's chart under media.   During the visit, I independently reviewed the patient's EKG, bioimpedance scale results, and indirect calorimeter results. I used this information to tailor a meal plan for  the patient that will help her to lose weight and will improve her Debbie-related conditions going forward. I performed a medically necessary appropriate examination and/or evaluation. I discussed the assessment and treatment plan with the patient. The patient was provided an opportunity to ask questions and all were answered. The patient agreed with the plan and demonstrated an understanding of the instructions. Labs were ordered at this visit and will be reviewed at the next visit unless more critical results need to be addressed immediately. Clinical information was updated and documented in the EMR.   Time spent on visit including pre-visit chart review and post-visit care was 45 minutes.   A separate 15 minutes was spent on risk counseling (see above).   I have  reviewed the above documentation for accuracy and completeness, and I agree with the above. - Jinny Blossom, MD

## 2020-08-01 ENCOUNTER — Other Ambulatory Visit: Payer: Self-pay

## 2020-08-01 ENCOUNTER — Encounter (INDEPENDENT_AMBULATORY_CARE_PROVIDER_SITE_OTHER): Payer: Self-pay | Admitting: Family Medicine

## 2020-08-01 ENCOUNTER — Ambulatory Visit (INDEPENDENT_AMBULATORY_CARE_PROVIDER_SITE_OTHER): Payer: Federal, State, Local not specified - PPO | Admitting: Family Medicine

## 2020-08-01 VITALS — BP 138/78 | HR 65 | Temp 97.8°F | Ht 63.0 in | Wt 219.0 lb

## 2020-08-01 DIAGNOSIS — E559 Vitamin D deficiency, unspecified: Secondary | ICD-10-CM

## 2020-08-01 DIAGNOSIS — E785 Hyperlipidemia, unspecified: Secondary | ICD-10-CM | POA: Diagnosis not present

## 2020-08-01 DIAGNOSIS — Z9189 Other specified personal risk factors, not elsewhere classified: Secondary | ICD-10-CM | POA: Diagnosis not present

## 2020-08-01 DIAGNOSIS — E1165 Type 2 diabetes mellitus with hyperglycemia: Secondary | ICD-10-CM

## 2020-08-01 DIAGNOSIS — Z6841 Body Mass Index (BMI) 40.0 and over, adult: Secondary | ICD-10-CM

## 2020-08-01 DIAGNOSIS — I152 Hypertension secondary to endocrine disorders: Secondary | ICD-10-CM

## 2020-08-01 DIAGNOSIS — E1159 Type 2 diabetes mellitus with other circulatory complications: Secondary | ICD-10-CM

## 2020-08-01 DIAGNOSIS — E1169 Type 2 diabetes mellitus with other specified complication: Secondary | ICD-10-CM

## 2020-08-01 MED ORDER — OZEMPIC (0.25 OR 0.5 MG/DOSE) 2 MG/1.5ML ~~LOC~~ SOPN: 0.2500 mg | PEN_INJECTOR | SUBCUTANEOUS | 0 refills | Status: DC

## 2020-08-02 DIAGNOSIS — E119 Type 2 diabetes mellitus without complications: Secondary | ICD-10-CM | POA: Diagnosis not present

## 2020-08-07 NOTE — Progress Notes (Signed)
Chief Complaint:   OBESITY Debbie Castro is here to discuss her progress with her obesity treatment plan along with follow-up of her obesity related diagnoses. Debbie Castro is on the Category 2 Plan + 100 calories and states she is following her eating plan approximately 90-100% of the time. Debbie Castro states she is not currently exercising.  Today's visit was #: 2 Starting weight: 226 lbs Starting date: 07/18/2020 Today's weight: 219 lbs Today's date: 08/01/2020 Total lbs lost to date: 7 Total lbs lost since last in-office visit: 7  Interim History: Debbie Castro felt the first few weeks weren't bad at all. She felt hungry before dinner, only occasionally though. She would do 60 calorie popsicle, rice cakes, and fruit. No real cravings. Pt was getting in at least 6 oz protein at dinner. She has no obstacles in the next few weeks.  Subjective:   1. Type 2 diabetes mellitus with hyperglycemia, without long-term current use of insulin (HCC) Debbie Castro is on Januvia and Metformin. Her next A1c is tomorrow. Last insulin level was 15.1.  2. Hyperlipidemia associated with type 2 diabetes mellitus (Debbie Castro) She is on Zetia and Crestor. Her LDL is 29, HDL 34, and triglycerides 104.   3. Hypertension associated with diabetes (Debbie Castro) BP controlled today. Debbie Castro is on Maxzide. Pt denies chest pain/chest pressure/headache.  4. Vitamin D deficiency She is currently taking OTC vitamin D 2000 IU each day. She denies nausea, vomiting or muscle weakness.  Lab Results  Component Value Date   VD25OH 71.8 07/18/2020   5. At risk for side effect of medication Yves is at risk for side effects of medication due to starting Ozempic.  Assessment/Plan:   1. Type 2 diabetes mellitus with hyperglycemia, without long-term current use of insulin (HCC) Pt will start Ozempic 0.25 mg, as prescribed below. Good blood sugar control is important to decrease the likelihood of diabetic complications such as nephropathy, neuropathy, limb  loss, blindness, coronary artery disease, and death. Intensive lifestyle modification including diet, exercise and weight loss are the first line of treatment for diabetes.   Start- Semaglutide,0.25 or 0.'5MG'$ /DOS, (OZEMPIC, 0.25 OR 0.5 MG/DOSE,) 2 MG/1.5ML SOPN; Inject 0.25 mg into the skin once a week.  Dispense: 1.5 mL; Refill: 0  2. Hyperlipidemia associated with type 2 diabetes mellitus ([place]) Cardiovascular risk and specific lipid/LDL goals reviewed.  We discussed several lifestyle modifications today and Debbie Castro will continue to work on diet, exercise and weight loss efforts. Orders and follow up as documented in patient record. Continue current treatment plan.  Counseling Intensive lifestyle modifications are the first line treatment for this issue. Dietary changes: Increase soluble fiber. Decrease simple carbohydrates. Exercise changes: Moderate to vigorous-intensity aerobic activity 150 minutes per week if tolerated. Lipid-lowering medications: see documented in medical record.  3. Hypertension associated with diabetes (Debbie Castro) Debbie Castro is working on healthy weight loss and exercise to improve blood pressure control. We will watch for signs of hypotension as she continues her lifestyle modifications. Continue current medications with no change in dose.  4. Vitamin D deficiency Low Vitamin D level contributes to fatigue and are associated with obesity, breast, and colon cancer. She agrees to continue to take OTC  Vitamin D 2,000 IU daily and will follow-up for routine testing of Vitamin D, at least 2-3 times per year to avoid over-replacement.  5. At risk for side effect of medication Debbie Castro was given approximately 30 minutes of drug side effect counseling today.  We discussed side effect possibility and risk versus benefits. Metztli agreed  to the medication and will contact this office if these side effects are intolerable.  Repetitive spaced learning was employed today to elicit superior  memory formation and behavioral change.   6. Obesity Current BMI 38.8  Debbie Castro is currently in the action stage of change. As such, her goal is to continue with weight loss efforts. She has agreed to the Category 2 Plan + 100 calories.   Exercise goals:  As is  Behavioral modification strategies: increasing lean protein intake, meal planning and cooking strategies, keeping healthy foods in the home, and planning for success.  Debbie Castro has agreed to follow-up with our clinic in 2 weeks. She was informed of the importance of frequent follow-up visits to maximize her success with intensive lifestyle modifications for her multiple health conditions.   Objective:   Blood pressure 138/78, pulse 65, temperature 97.8 F (36.6 C), height '5\' 3"'$  (1.6 m), weight 219 lb (99.3 kg), SpO2 97 %. Body mass index is 38.79 kg/m.  General: Cooperative, alert, well developed, in no acute distress. HEENT: Conjunctivae and lids unremarkable. Cardiovascular: Regular rhythm.  Lungs: Normal work of breathing. Neurologic: No focal deficits.   Lab Results  Component Value Date   CREATININE 0.85 07/18/2020   BUN 19 07/18/2020   NA 140 07/18/2020   K 4.1 07/18/2020   CL 99 07/18/2020   CO2 26 07/18/2020   Lab Results  Component Value Date   ALT 36 (H) 07/18/2020   AST 26 07/18/2020   ALKPHOS 63 07/18/2020   BILITOT 0.4 07/18/2020   No results found for: HGBA1C Lab Results  Component Value Date   INSULIN 15.1 07/18/2020   Lab Results  Component Value Date   TSH 1.230 07/18/2020   Lab Results  Component Value Date   CHOL 83 (L) 07/18/2020   HDL 34 (L) 07/18/2020   LDLCALC 29 07/18/2020   TRIG 104 07/18/2020   Lab Results  Component Value Date   VD25OH 71.8 07/18/2020   Lab Results  Component Value Date   WBC 7.3 07/18/2020   HGB 13.6 07/18/2020   HCT 43.0 07/18/2020   MCV 91 07/18/2020   PLT 305 07/18/2020   No results found for: IRON, TIBC, FERRITIN  Attestation Statements:    Reviewed by clinician on day of visit: allergies, medications, problem list, medical history, surgical history, family history, social history, and previous encounter notes.  Coral Ceo, CMA, am acting as transcriptionist for Coralie Common, MD.   I have reviewed the above documentation for accuracy and completeness, and I agree with the above. - Coralie Common, MD

## 2020-08-14 ENCOUNTER — Other Ambulatory Visit: Payer: Self-pay

## 2020-08-14 ENCOUNTER — Encounter (INDEPENDENT_AMBULATORY_CARE_PROVIDER_SITE_OTHER): Payer: Self-pay | Admitting: Physician Assistant

## 2020-08-14 ENCOUNTER — Ambulatory Visit (INDEPENDENT_AMBULATORY_CARE_PROVIDER_SITE_OTHER): Payer: Federal, State, Local not specified - PPO | Admitting: Physician Assistant

## 2020-08-14 VITALS — BP 127/82 | HR 81 | Temp 98.0°F | Ht 63.0 in | Wt 215.0 lb

## 2020-08-14 DIAGNOSIS — E1165 Type 2 diabetes mellitus with hyperglycemia: Secondary | ICD-10-CM | POA: Diagnosis not present

## 2020-08-14 DIAGNOSIS — Z9189 Other specified personal risk factors, not elsewhere classified: Secondary | ICD-10-CM | POA: Diagnosis not present

## 2020-08-14 DIAGNOSIS — E559 Vitamin D deficiency, unspecified: Secondary | ICD-10-CM

## 2020-08-14 DIAGNOSIS — E785 Hyperlipidemia, unspecified: Secondary | ICD-10-CM

## 2020-08-14 DIAGNOSIS — Z6841 Body Mass Index (BMI) 40.0 and over, adult: Secondary | ICD-10-CM

## 2020-08-14 MED ORDER — OZEMPIC (0.25 OR 0.5 MG/DOSE) 2 MG/1.5ML ~~LOC~~ SOPN: 0.2500 mg | PEN_INJECTOR | SUBCUTANEOUS | 0 refills | Status: DC

## 2020-08-15 NOTE — Progress Notes (Signed)
Chief Complaint:   OBESITY Debbie Castro is here to discuss her progress with her obesity treatment plan along with follow-up of her obesity related diagnoses. Janeeka is on the Category 2 Plan plus 100 calories and states she is following her eating plan approximately 99% of the time. Fiana states she is doing 0 minutes 0 times per week.  Today's visit was #: 3 Starting weight: 226 lbs Starting date: 07/18/2020 Today's weight: 215 lbs Today's date:08/14/2020 Total lbs lost to date: 11 lbs Total lbs lost since last in-office visit: 4 lbs  Interim History: Akita is eating yogurt for breakfast, a [place] sandwich for lunch (sometimes from microwave meals), and meat and veggies for dinner. She sometimes struggles to get all the protein for dinner. She is eating popsicles, rice cakes and cheese for snacks.   Subjective:   1. Type 2 diabetes mellitus with hyperglycemia, without long-term current use of insulin ([place]) Marai's last A1C level was on 06/28/2020 at 7.1 with her PCP. She is on Ozempic 0.25 mg and Metformin.  2. Vitamin D deficiency Manisha's taking Vitamin D 2.000 units daily.  3. At risk for heart disease Makaylyn is at risk for heart disease due to obesity.  Assessment/Plan:   1. Type 2 diabetes mellitus with hyperglycemia, without long-term current use of insulin (HCC) Good blood sugar control is important to decrease the likelihood of diabetic complications such as nephropathy, neuropathy, limb loss, blindness, coronary artery disease, and death. Intensive lifestyle modification including diet, exercise and weight loss are the first line of treatment for diabetes. We will refill Ozempic 0.25 for  1 month with no refills.  - Semaglutide,0.25 or 0.'5MG'$ /DOS, (OZEMPIC, 0.25 OR 0.5 MG/DOSE,) 2 MG/1.5ML SOPN; Inject 0.25 mg into the skin once a week.  Dispense: 1.5 mL; Refill: 0  2. Vitamin D deficiency Low Vitamin D level contributes to fatigue and are associated with obesity,  breast, and colon cancer. She agrees to continue to take prescription Vitamin 2,000 IU daily and will follow-up for routine testing of Vitamin D, at least 2-3 times per year to avoid over-replacement.   3. At risk for heart disease Donah was given approximately 15 minutes of coronary artery disease prevention counseling today. She is 60 y.o. female and has risk factors for heart disease including obesity. We discussed intensive lifestyle modifications today with an emphasis on specific weight loss instructions and strategies.   Repetitive spaced learning was employed today to elicit superior memory formation and behavioral change.   4. Obesity Current BMI 38.08 Mairany is currently in the action stage of change. As such, her goal is to continue with weight loss efforts. She has agreed to the Category 2 Plan plus 100 calories.  Londan was given lunch options today.  Exercise goals: No exercise has been prescribed at this time.  Behavioral modification strategies: increasing lean protein intake, meal planning and cooking strategies, and keeping healthy foods in the home.  Amba has agreed to follow-up with our clinic in 2 weeks. She was informed of the importance of frequent follow-up visits to maximize her success with intensive lifestyle modifications for her multiple health conditions.   Objective:   Blood pressure 127/82, pulse 81, temperature 98 F (36.7 C), height '5\' 3"'$  (1.6 m), weight 215 lb (97.5 kg), SpO2 96 %. Body mass index is 38.09 kg/m.  General: Cooperative, alert, well developed, in no acute distress. HEENT: Conjunctivae and lids unremarkable. Cardiovascular: Regular rhythm.  Lungs: Normal work of breathing. Neurologic: No focal deficits.  Lab Results  Component Value Date   CREATININE 0.85 07/18/2020   BUN 19 07/18/2020   NA 140 07/18/2020   K 4.1 07/18/2020   CL 99 07/18/2020   CO2 26 07/18/2020   Lab Results  Component Value Date   ALT 36 (H) 07/18/2020    AST 26 07/18/2020   ALKPHOS 63 07/18/2020   BILITOT 0.4 07/18/2020   No results found for: HGBA1C Lab Results  Component Value Date   INSULIN 15.1 07/18/2020   Lab Results  Component Value Date   TSH 1.230 07/18/2020   Lab Results  Component Value Date   CHOL 83 (L) 07/18/2020   HDL 34 (L) 07/18/2020   LDLCALC 29 07/18/2020   TRIG 104 07/18/2020   Lab Results  Component Value Date   VD25OH 71.8 07/18/2020   Lab Results  Component Value Date   WBC 7.3 07/18/2020   HGB 13.6 07/18/2020   HCT 43.0 07/18/2020   MCV 91 07/18/2020   PLT 305 07/18/2020   No results found for: IRON, TIBC, FERRITIN  Reviewed by clinician on day of visit: allergies, medications, problem list, medical history, surgical history, family history, social history, and previous encounter notes.  I, Tonye Pearson, am acting as Location manager for [employer], PA-C.   I have reviewed the above documentation for accuracy and completeness, and I agree with the above. Abby Potash, PA-C

## 2020-08-24 ENCOUNTER — Ambulatory Visit (INDEPENDENT_AMBULATORY_CARE_PROVIDER_SITE_OTHER): Payer: Federal, State, Local not specified - PPO | Admitting: Family Medicine

## 2020-08-24 ENCOUNTER — Encounter (INDEPENDENT_AMBULATORY_CARE_PROVIDER_SITE_OTHER): Payer: Self-pay | Admitting: Family Medicine

## 2020-08-24 ENCOUNTER — Other Ambulatory Visit: Payer: Self-pay

## 2020-08-24 VITALS — BP 114/71 | HR 75 | Temp 97.8°F | Ht 63.0 in | Wt 212.0 lb

## 2020-08-24 DIAGNOSIS — I152 Hypertension secondary to endocrine disorders: Secondary | ICD-10-CM | POA: Diagnosis not present

## 2020-08-24 DIAGNOSIS — K5909 Other constipation: Secondary | ICD-10-CM | POA: Diagnosis not present

## 2020-08-24 DIAGNOSIS — Z6841 Body Mass Index (BMI) 40.0 and over, adult: Secondary | ICD-10-CM

## 2020-08-24 DIAGNOSIS — E1159 Type 2 diabetes mellitus with other circulatory complications: Secondary | ICD-10-CM | POA: Diagnosis not present

## 2020-08-24 NOTE — Patient Instructions (Signed)
Health Maintenance Due  Topic Date Due   HIV Screening  Never done   Hepatitis C Screening  Never done   TETANUS/TDAP  Never done   PAP SMEAR-Modifier  Never done   Zoster Vaccines- Shingrix (1 of 2) Never done   COVID-19 Vaccine (3 - Booster for Pfizer series) 09/21/2019   INFLUENZA VACCINE  08/14/2020    Depression screen PHQ 2/9 07/18/2020  Decreased Interest 3  Down, Depressed, Hopeless 1  PHQ - 2 Score 4  Altered sleeping 3  Tired, decreased energy 3  Change in appetite 2  Feeling bad or failure about yourself  2  Trouble concentrating 1  Moving slowly or fidgety/restless 1  Suicidal thoughts 0  PHQ-9 Score 16  Difficult doing work/chores Not difficult at all

## 2020-08-25 NOTE — Progress Notes (Signed)
Chief Complaint:   OBESITY Debbie Castro is here to discuss her progress with her obesity treatment plan along with follow-up of her obesity related diagnoses. Debbie Castro is on the Category 2 Plan and states she is following her eating plan approximately 90% of the time. Debbie Castro states she has not been exercising.  Today's visit was #: 4 Starting weight: 226 lbs Starting date: 07/18/2020 Today's weight: 212 lbs Today's date: 08/24/2020 Total lbs lost to date: 14 Total lbs lost since last in-office visit: 3  Interim History: Debbie Castro was been working on following the plan fairly strictly. Her biggest obstacle is the large protein serving at dinner. She started using the protein substitution list to get all her protein in. For snacks, Debbie Castro is doing [employer]shine popsicles, cheese, and rice cakes.  Subjective:   1. Hypertension associated with diabetes ([place]) Debbie Castro's blood pressure is well controlled today. Cardiovascular ROS: negative for chest pain, chest pressure, and headaches.  BP Readings from Last 3 Encounters:  08/24/20 114/71  08/14/20 127/82  08/01/20 138/78   Lab Results  Component Value Date   CREATININE 0.85 07/18/2020   2. Other constipation Marybelle notes constipation as she is experiencing a decrease in frequency of bowel movements. Debbie Castro did take 1 fiber tablet yesterday.  Assessment/Plan:   1. Hypertension associated with diabetes ([place]) Debbie Castro is working on healthy weight loss and exercise to improve blood pressure control. We will watch for signs of hypotension as she continues her lifestyle modifications. We will change her blood pressure dosage at the next appointment.  2. Other constipation Debbie Castro was informed that a decrease in bowel movement frequency is normal while losing weight, but stools should not be hard or painful. Orders and follow up as documented in patient record. Debbie Castro can continue fiber tablet as needed.  Counseling Getting to Good Bowel Health: Your  goal is to have one soft bowel movement each day. Drink at least 8 glasses of water each day. Eat plenty of fiber (goal is over 25 grams each day). It is best to get most of your fiber from dietary sources which includes leafy green vegetables, fresh fruit, and whole grains. You may need to add fiber with the help of OTC fiber supplements. These include Metamucil, Citrucel, and Flaxseed. If you are still having trouble, try adding Miralax or Magnesium Citrate. If all of these changes do not work, Cabin crew.   3. Obesity Current BMI 37.6 Debbie Castro is currently in the action stage of change. As such, her goal is to continue with weight loss efforts. She has agreed to the Category 2 Plan.   Exercise goals: No exercise has been prescribed at this time.  Behavioral modification strategies: increasing lean protein intake, meal planning and cooking strategies, keeping healthy foods in the home, and planning for success.  Debbie Castro has agreed to follow-up with our clinic in 3 weeks. She was informed of the importance of frequent follow-up visits to maximize her success with intensive lifestyle modifications for her multiple health conditions.   Objective:   Blood pressure 114/71, pulse 75, temperature 97.8 F (36.6 C), height '5\' 3"'$  (1.6 m), weight 212 lb (96.2 kg), SpO2 98 %. Body mass index is 37.55 kg/m.  General: Cooperative, alert, well developed, in no acute distress. HEENT: Conjunctivae and lids unremarkable. Cardiovascular: Regular rhythm.  Lungs: Normal work of breathing. Neurologic: No focal deficits.   Lab Results  Component Value Date   CREATININE 0.85 07/18/2020   BUN 19 07/18/2020   NA  140 07/18/2020   K 4.1 07/18/2020   CL 99 07/18/2020   CO2 26 07/18/2020   Lab Results  Component Value Date   ALT 36 (H) 07/18/2020   AST 26 07/18/2020   ALKPHOS 63 07/18/2020   BILITOT 0.4 07/18/2020   No results found for: HGBA1C Lab Results  Component Value Date   INSULIN 15.1  07/18/2020   Lab Results  Component Value Date   TSH 1.230 07/18/2020   Lab Results  Component Value Date   CHOL 83 (L) 07/18/2020   HDL 34 (L) 07/18/2020   LDLCALC 29 07/18/2020   TRIG 104 07/18/2020   Lab Results  Component Value Date   VD25OH 71.8 07/18/2020   Lab Results  Component Value Date   WBC 7.3 07/18/2020   HGB 13.6 07/18/2020   HCT 43.0 07/18/2020   MCV 91 07/18/2020   PLT 305 07/18/2020   No results found for: IRON, TIBC, FERRITIN  Attestation Statements:   Reviewed by clinician on day of visit: allergies, medications, problem list, medical history, surgical history, family history, social history, and previous encounter notes.  Time spent on visit including pre-visit chart review and post-visit care and charting was 12 minutes.   IMarcille Blanco, CMA, am acting as transcriptionist for Coralie Common, MD   I have reviewed the above documentation for accuracy and completeness, and I agree with the above. - Coralie Common, MD

## 2020-08-29 ENCOUNTER — Ambulatory Visit (INDEPENDENT_AMBULATORY_CARE_PROVIDER_SITE_OTHER): Payer: Federal, State, Local not specified - PPO | Admitting: Physician Assistant

## 2020-09-25 ENCOUNTER — Encounter (INDEPENDENT_AMBULATORY_CARE_PROVIDER_SITE_OTHER): Payer: Self-pay | Admitting: Family Medicine

## 2020-09-25 ENCOUNTER — Ambulatory Visit (INDEPENDENT_AMBULATORY_CARE_PROVIDER_SITE_OTHER): Payer: Federal, State, Local not specified - PPO | Admitting: Family Medicine

## 2020-09-25 ENCOUNTER — Other Ambulatory Visit: Payer: Self-pay

## 2020-09-25 VITALS — BP 102/70 | HR 79 | Temp 98.1°F | Ht 63.0 in | Wt 202.0 lb

## 2020-09-25 DIAGNOSIS — Z9189 Other specified personal risk factors, not elsewhere classified: Secondary | ICD-10-CM | POA: Diagnosis not present

## 2020-09-25 DIAGNOSIS — E785 Hyperlipidemia, unspecified: Secondary | ICD-10-CM | POA: Diagnosis not present

## 2020-09-25 DIAGNOSIS — I152 Hypertension secondary to endocrine disorders: Secondary | ICD-10-CM | POA: Diagnosis not present

## 2020-09-25 DIAGNOSIS — E1159 Type 2 diabetes mellitus with other circulatory complications: Secondary | ICD-10-CM | POA: Diagnosis not present

## 2020-09-25 DIAGNOSIS — E1165 Type 2 diabetes mellitus with hyperglycemia: Secondary | ICD-10-CM

## 2020-09-25 DIAGNOSIS — Z6841 Body Mass Index (BMI) 40.0 and over, adult: Secondary | ICD-10-CM

## 2020-09-25 MED ORDER — HYDROCHLOROTHIAZIDE 25 MG PO TABS: 25.0000 mg | ORAL_TABLET | Freq: Every day | ORAL | 0 refills | Status: DC

## 2020-09-25 MED ORDER — OZEMPIC (0.25 OR 0.5 MG/DOSE) 2 MG/1.5ML ~~LOC~~ SOPN: 0.2500 mg | PEN_INJECTOR | SUBCUTANEOUS | 0 refills | Status: DC

## 2020-09-25 NOTE — Progress Notes (Signed)
Chief Complaint:   OBESITY Debbie Castro is here to discuss her progress with her obesity treatment plan along with follow-up of her obesity related diagnoses. Debbie Castro is on the Category 2 Plan and states she is following her eating plan approximately 80% of the time. Debbie Castro states she is walking 1 mile 7 times a week.  Today's visit was #: 5 Starting weight: 226 lbs Starting date: 07/18/2020 Today's weight: 202 lbs Today's date: 09/25/2020 Total lbs lost to date: 24 Total lbs lost since last in-office visit: 10  Interim History: Debbie Castro went to [place] for work for 4 days and really struggled with adherence to meal plan while away. She has a trip to [facility] in mid October that includes [place], [place], and some time in [place].  Subjective:   1. Type 2 diabetes mellitus with hyperglycemia, without long-term current use of insulin (HCC) Debbie Castro reports some occasional constipation with Ozempic 0.25 mg. Her last A1c was 6.7.  2. Hypertension associated with diabetes (HCC) BP low today. Debbie Castro denies dizziness or lightheadedness. She is on Maxzide-25.  3. At risk for hypertension The patient is at a higher than average risk of hypertension due to decreased BP meds.  Assessment/Plan:   1. Type 2 diabetes mellitus with hyperglycemia, without long-term current use of insulin (HCC) Good blood sugar control is important to decrease the likelihood of diabetic complications such as nephropathy, neuropathy, limb loss, blindness, coronary artery disease, and death. Intensive lifestyle modification including diet, exercise and weight loss are the first line of treatment for diabetes.   Refill- Semaglutide,0.25 or 0.'5MG'$ /DOS, (OZEMPIC, 0.25 OR 0.5 MG/DOSE,) 2 MG/1.5ML SOPN; Inject 0.25 mg into the skin once a week.  Dispense: 1.5 mL; Refill: 0  2. Hypertension associated with diabetes ([place]) Discontinue Maxzide and start HCTZ 25 mg. Debbie Castro is working on healthy weight loss and exercise to  improve blood pressure control. We will watch for signs of hypotension as she continues her lifestyle modifications. Follow up BP at next appt.  Start- hydrochlorothiazide (HYDRODIURIL) 25 MG tablet; Take 1 tablet (25 mg total) by mouth daily.  Dispense: 30 tablet; Refill: 0  3. At risk for hypertension Debbie Castro was given approximately 15 minutes of hypertension prevention counseling today. Debbie Castro is at risk for hypertension due to obesity. We discussed intensive lifestyle modifications today with an emphasis on weight loss as well as increasing exercise and decreasing salt intake.  Repetitive spaced learning was employed today to elicit superior memory formation and behavioral change.  4. Obesity Current BMI of 35.9  Debbie Castro is currently in the action stage of change. As such, her goal is to continue with weight loss efforts. She has agreed to the Category 2 Plan and keeping a food journal and adhering to recommended goals of 1150-1300 calories and 85+ grams protein.   Exercise goals:  As is  Behavioral modification strategies: increasing lean protein intake, meal planning and cooking strategies, keeping healthy foods in the home, and planning for success.  Brave has agreed to follow-up with our clinic in 2-3 weeks. She was informed of the importance of frequent follow-up visits to maximize her success with intensive lifestyle modifications for her multiple health conditions.   Objective:   Blood pressure 102/70, pulse 79, temperature 98.1 F (36.7 C), height '5\' 3"'$  (1.6 m), weight 202 lb (91.6 kg), SpO2 97 %. Body mass index is 35.78 kg/m.  General: Cooperative, alert, well developed, in no acute distress. HEENT: Conjunctivae and lids unremarkable.  Cardiovascular: Regular rhythm.  Lungs: Normal work of breathing. Neurologic: No focal deficits.   Lab Results  Component Value Date   CREATININE 0.85 07/18/2020   BUN 19 07/18/2020   NA 140 07/18/2020   K 4.1 07/18/2020   CL  99 07/18/2020   CO2 26 07/18/2020   Lab Results  Component Value Date   ALT 36 (H) 07/18/2020   AST 26 07/18/2020   ALKPHOS 63 07/18/2020   BILITOT 0.4 07/18/2020   No results found for: HGBA1C Lab Results  Component Value Date   INSULIN 15.1 07/18/2020   Lab Results  Component Value Date   TSH 1.230 07/18/2020   Lab Results  Component Value Date   CHOL 83 (L) 07/18/2020   HDL 34 (L) 07/18/2020   LDLCALC 29 07/18/2020   TRIG 104 07/18/2020   Lab Results  Component Value Date   VD25OH 71.8 07/18/2020   Lab Results  Component Value Date   WBC 7.3 07/18/2020   HGB 13.6 07/18/2020   HCT 43.0 07/18/2020   MCV 91 07/18/2020   PLT 305 07/18/2020    Attestation Statements:   Reviewed by clinician on day of visit: allergies, medications, problem list, medical history, surgical history, family history, social history, and previous encounter notes.  Coral Ceo, CMA, am acting as transcriptionist for Coralie Common, MD.   I have reviewed the above documentation for accuracy and completeness, and I agree with the above. - Coralie Common, MD

## 2020-09-26 DIAGNOSIS — C44529 Squamous cell carcinoma of skin of other part of trunk: Secondary | ICD-10-CM | POA: Diagnosis not present

## 2020-10-10 ENCOUNTER — Ambulatory Visit (INDEPENDENT_AMBULATORY_CARE_PROVIDER_SITE_OTHER): Payer: Federal, State, Local not specified - PPO | Admitting: Family Medicine

## 2020-10-10 ENCOUNTER — Encounter (INDEPENDENT_AMBULATORY_CARE_PROVIDER_SITE_OTHER): Payer: Self-pay | Admitting: Family Medicine

## 2020-10-10 ENCOUNTER — Other Ambulatory Visit: Payer: Self-pay

## 2020-10-10 VITALS — BP 107/73 | HR 66 | Temp 97.5°F | Ht 63.0 in | Wt 199.0 lb

## 2020-10-10 DIAGNOSIS — Z9189 Other specified personal risk factors, not elsewhere classified: Secondary | ICD-10-CM

## 2020-10-10 DIAGNOSIS — E1159 Type 2 diabetes mellitus with other circulatory complications: Secondary | ICD-10-CM

## 2020-10-10 DIAGNOSIS — I152 Hypertension secondary to endocrine disorders: Secondary | ICD-10-CM

## 2020-10-10 DIAGNOSIS — E1165 Type 2 diabetes mellitus with hyperglycemia: Secondary | ICD-10-CM | POA: Diagnosis not present

## 2020-10-10 DIAGNOSIS — Z6841 Body Mass Index (BMI) 40.0 and over, adult: Secondary | ICD-10-CM

## 2020-10-10 MED ORDER — HYDROCHLOROTHIAZIDE 25 MG PO TABS: 25.0000 mg | ORAL_TABLET | Freq: Every day | ORAL | 0 refills | Status: DC

## 2020-10-10 MED ORDER — OZEMPIC (0.25 OR 0.5 MG/DOSE) 2 MG/1.5ML ~~LOC~~ SOPN: 0.2500 mg | PEN_INJECTOR | SUBCUTANEOUS | 0 refills | Status: DC

## 2020-10-10 NOTE — Progress Notes (Signed)
Chief Complaint:   OBESITY Debbie Castro is here to discuss her progress with her obesity treatment plan along with follow-up of her obesity related diagnoses. Debbie Castro is on the Category 2 Plan and keeping a food journal and adhering to recommended goals of 1150-1300 calories and 85+ grams protein and states she is following her eating plan approximately 90% of the time. Debbie Castro states she is walking the dog 1 mile 7 times per week.  Today's visit was #: 6 Starting weight: 226 lbs Starting date: 07/18/2020 Today's weight: 199 lbs Today's date: 10/10/2020 Total lbs lost to date: 27 Total lbs lost since last in-office visit: 3  Interim History: Debbie Castro didn't journal but did category 2; likely going to journal when traveling. She is using snack calories for apple or crackers. Pt did indulge in some salty crunchy last night. She is not necessarily hungry between meals, especially if she keeps herself on a normal routine. She has a trip October 15-22. Going to [place] in October.  Subjective:   1. Hypertension associated with diabetes ([place]) BP well controlled. Pt denies chest pain/chest pressure/headache.  2. Type 2 diabetes mellitus with hyperglycemia, without long-term current use of insulin (Debbie Castro) Debbie Castro is doing well on Ozempic with no GI side effects.  3. At risk for heart disease Debbie Castro is at a higher than average risk for cardiovascular disease due to obesity.   Assessment/Plan:   1. Hypertension associated with diabetes ([place]) Debbie Castro is working on healthy weight loss and exercise to improve blood pressure control. We will watch for signs of hypotension as she continues her lifestyle modifications. Continue current treatment plan.  Refill- hydrochlorothiazide (HYDRODIURIL) 25 MG tablet; Take 1 tablet (25 mg total) by mouth daily.  Dispense: 90 tablet; Refill: 0  2. Type 2 diabetes mellitus with hyperglycemia, without long-term current use of insulin (Debbie Castro) Good blood sugar control is  important to decrease the likelihood of diabetic complications such as nephropathy, neuropathy, limb loss, blindness, coronary artery disease, and death. Intensive lifestyle modification including diet, exercise and weight loss are the first line of treatment for diabetes. Continue Ozempic as directed.   Refill- Semaglutide,0.25 or 0.5MG /DOS, (OZEMPIC, 0.25 OR 0.5 MG/DOSE,) 2 MG/1.5ML SOPN; Inject 0.25 mg into the skin once a week.  Dispense: 1.5 mL; Refill: 0  3. At risk for heart disease Debbie Castro was given approximately 15 minutes of coronary artery disease prevention counseling today. She is 60 y.o. female and has risk factors for heart disease including obesity. We discussed intensive lifestyle modifications today with an emphasis on specific weight loss instructions and strategies.   Repetitive spaced learning was employed today to elicit superior memory formation and behavioral change.  4. Obesity with current BMI of 35.3  Debbie Castro is currently in the action stage of change. As such, her goal is to continue with weight loss efforts. She has agreed to the Category 2 Plan and keeping a food journal and adhering to recommended goals of 1150-1300 calories and 85+ grams protein.   Exercise goals: All adults should avoid inactivity. Some physical activity is better than none, and adults who participate in any amount of physical activity gain some health benefits. Pt is planning to incorporate resistance training.   Behavioral modification strategies: increasing lean protein intake, meal planning and cooking strategies, keeping healthy foods in the home, and travel eating strategies.  Debbie Castro has agreed to follow-up with our clinic in 3 weeks. She was informed of the importance of frequent follow-up visits to maximize her success  with intensive lifestyle modifications for her multiple health conditions.   Objective:   Blood pressure 107/73, pulse 66, temperature (!) 97.5 F (36.4 C), height 5\' 3"   (1.6 m), weight 199 lb (90.3 kg), SpO2 98 %. Body mass index is 35.25 kg/m.  General: Cooperative, alert, well developed, in no acute distress. HEENT: Conjunctivae and lids unremarkable. Cardiovascular: Regular rhythm.  Lungs: Normal work of breathing. Neurologic: No focal deficits.   Lab Results  Component Value Date   CREATININE 0.85 07/18/2020   BUN 19 07/18/2020   NA 140 07/18/2020   K 4.1 07/18/2020   CL 99 07/18/2020   CO2 26 07/18/2020   Lab Results  Component Value Date   ALT 36 (H) 07/18/2020   AST 26 07/18/2020   ALKPHOS 63 07/18/2020   BILITOT 0.4 07/18/2020   No results found for: HGBA1C Lab Results  Component Value Date   INSULIN 15.1 07/18/2020   Lab Results  Component Value Date   TSH 1.230 07/18/2020   Lab Results  Component Value Date   CHOL 83 (L) 07/18/2020   HDL 34 (L) 07/18/2020   LDLCALC 29 07/18/2020   TRIG 104 07/18/2020   Lab Results  Component Value Date   VD25OH 71.8 07/18/2020   Lab Results  Component Value Date   WBC 7.3 07/18/2020   HGB 13.6 07/18/2020   HCT 43.0 07/18/2020   MCV 91 07/18/2020   PLT 305 07/18/2020    Attestation Statements:   Reviewed by clinician on day of visit: allergies, medications, problem list, medical history, surgical history, family history, social history, and previous encounter notes.  Coral Ceo, CMA, am acting as transcriptionist for Coralie Common, MD.   I have reviewed the above documentation for accuracy and completeness, and I agree with the above. - Coralie Common, MD

## 2020-10-16 ENCOUNTER — Other Ambulatory Visit: Payer: Self-pay | Admitting: Family Medicine

## 2020-10-16 DIAGNOSIS — J029 Acute pharyngitis, unspecified: Secondary | ICD-10-CM | POA: Diagnosis not present

## 2020-10-16 DIAGNOSIS — Z03818 Encounter for observation for suspected exposure to other biological agents ruled out: Secondary | ICD-10-CM | POA: Diagnosis not present

## 2020-10-16 DIAGNOSIS — U071 COVID-19: Secondary | ICD-10-CM | POA: Diagnosis not present

## 2020-10-17 LAB — SARS CORONAVIRUS 2 (TAT 6-24 HRS): SARS Coronavirus 2: NEGATIVE

## 2020-11-07 ENCOUNTER — Other Ambulatory Visit: Payer: Self-pay

## 2020-11-07 ENCOUNTER — Ambulatory Visit (INDEPENDENT_AMBULATORY_CARE_PROVIDER_SITE_OTHER): Payer: Federal, State, Local not specified - PPO | Admitting: Family Medicine

## 2020-11-07 ENCOUNTER — Encounter (INDEPENDENT_AMBULATORY_CARE_PROVIDER_SITE_OTHER): Payer: Self-pay | Admitting: Family Medicine

## 2020-11-07 VITALS — BP 113/79 | HR 92 | Temp 98.3°F | Ht 63.0 in | Wt 191.0 lb

## 2020-11-07 DIAGNOSIS — I152 Hypertension secondary to endocrine disorders: Secondary | ICD-10-CM | POA: Diagnosis not present

## 2020-11-07 DIAGNOSIS — Z6841 Body Mass Index (BMI) 40.0 and over, adult: Secondary | ICD-10-CM | POA: Diagnosis not present

## 2020-11-07 DIAGNOSIS — E1159 Type 2 diabetes mellitus with other circulatory complications: Secondary | ICD-10-CM | POA: Diagnosis not present

## 2020-11-07 DIAGNOSIS — E1165 Type 2 diabetes mellitus with hyperglycemia: Secondary | ICD-10-CM

## 2020-11-07 MED ORDER — TRIAMTERENE-HCTZ 37.5-25 MG PO TABS: 0.5000 | ORAL_TABLET | Freq: Every day | ORAL | 0 refills | Status: DC

## 2020-11-07 NOTE — Progress Notes (Signed)
Chief Complaint:   OBESITY Debbie Castro is here to discuss her progress with her obesity treatment plan along with follow-up of her obesity related diagnoses. Debbie Castro is on keeping a food journal and adhering to recommended goals of 1150-1300 calories and 85+ grams protein and states she is following her eating plan approximately 50% of the time. Debbie Castro states she is doing 10,000+ steps 4-5 times per week.  Today's visit was #: 7 Starting weight: 226 lbs Starting date: 07/18/2020 Today's weight: 191 lbs Today's date: 11/07/2020 Total lbs lost to date: 35 Total lbs lost since last in-office visit: 9  Interim History: Debbie Castro just returned from a lengthy vacation a few days ago ([place] and [place]). She voices that most places had calories noted on the menus and so she was very mindful of choices. Pt has no upcoming trips. She reverted back to category 2 upon returning home.  Subjective:   1. Hypertension associated with diabetes ([place]) BP well controlled. Pt's PCP voices concern over pt's potassium levels.  2. Type 2 diabetes mellitus with hyperglycemia, without long-term current use of insulin (HCC) Debbie Castro is on Januvia, Ozempic, and Metformin.  Assessment/Plan:   1. Hypertension associated with diabetes ([place]) Debbie Castro is working on healthy weight loss and exercise to improve blood pressure control. We will watch for signs of hypotension as she continues her lifestyle modifications. Change from HCTZ to triamterene-HCTZ as directed.  Change To- triamterene-hydrochlorothiazide (MAXZIDE-25) 37.5-25 MG tablet; Take 0.5 tablets by mouth daily.  Dispense: 30 tablet; Refill: 0  2. Type 2 diabetes mellitus with hyperglycemia, without long-term current use of insulin (HCC) Good blood sugar control is important to decrease the likelihood of diabetic complications such as nephropathy, neuropathy, limb loss, blindness, coronary artery disease, and death. Intensive lifestyle modification including  diet, exercise and weight loss are the first line of treatment for diabetes. Continue current treatment plan. Labs per PCP in December.  3. Obesity with current BMI of 33.9  Debbie Castro is currently in the action stage of change. As such, her goal is to continue with weight loss efforts. She has agreed to the Category 2 Plan and keeping a food journal and adhering to recommended goals of 1150-1300 calories and 80+ grams protein.   Exercise goals: All adults should avoid inactivity. Some physical activity is better than none, and adults who participate in any amount of physical activity gain some health benefits. Pt wants to start going to [employer] for 20 minutes 3 times a week.  Behavioral modification strategies: increasing lean protein intake, meal planning and cooking strategies, keeping healthy foods in the home, and planning for success.  Debbie Castro has agreed to follow-up with our clinic in 2-3 weeks. She was informed of the importance of frequent follow-up visits to maximize her success with intensive lifestyle modifications for her multiple health conditions.   Objective:   Blood pressure 113/79, pulse 92, temperature 98.3 F (36.8 C), height 5\' 3"  (1.6 m), weight 191 lb (86.6 kg), SpO2 98 %. Body mass index is 33.83 kg/m.  General: Cooperative, alert, well developed, in no acute distress. HEENT: Conjunctivae and lids unremarkable. Cardiovascular: Regular rhythm.  Lungs: Normal work of breathing. Neurologic: No focal deficits.   Lab Results  Component Value Date   CREATININE 0.85 07/18/2020   BUN 19 07/18/2020   NA 140 07/18/2020   K 4.1 07/18/2020   CL 99 07/18/2020   CO2 26 07/18/2020   Lab Results  Component Value Date   ALT 36 (H)  07/18/2020   AST 26 07/18/2020   ALKPHOS 63 07/18/2020   BILITOT 0.4 07/18/2020   No results found for: HGBA1C Lab Results  Component Value Date   INSULIN 15.1 07/18/2020   Lab Results  Component Value Date   TSH 1.230 07/18/2020    Lab Results  Component Value Date   CHOL 83 (L) 07/18/2020   HDL 34 (L) 07/18/2020   LDLCALC 29 07/18/2020   TRIG 104 07/18/2020   Lab Results  Component Value Date   VD25OH 71.8 07/18/2020   Lab Results  Component Value Date   WBC 7.3 07/18/2020   HGB 13.6 07/18/2020   HCT 43.0 07/18/2020   MCV 91 07/18/2020   PLT 305 07/18/2020   No results found for: IRON, TIBC, FERRITIN  Obesity Behavioral Intervention:   Approximately 15 minutes were spent on the discussion below.  ASK: We discussed the diagnosis of obesity with Debbie Castro today and Debbie Castro agreed to give [place] permission to discuss obesity behavioral modification therapy today.  ASSESS: Debbie Castro has the diagnosis of obesity and her BMI today is 33.9. Debbie Castro is in the action stage of change.   ADVISE: Debbie Castro was educated on the multiple health risks of obesity as well as the benefit of weight loss to improve her health. She was advised of the need for long term treatment and the importance of lifestyle modifications to improve her current health and to decrease her risk of future health problems.  AGREE: Multiple dietary modification options and treatment options were discussed and Debbie Castro agreed to follow the recommendations documented in the above note.  ARRANGE: Debbie Castro was educated on the importance of frequent visits to treat obesity as outlined per CMS and USPSTF guidelines and agreed to schedule her next follow up appointment today.  Attestation Statements:   Reviewed by clinician on day of visit: allergies, medications, problem list, medical history, surgical history, family history, social history, and previous encounter notes.  Coral Ceo, CMA, am acting as transcriptionist for Coralie Common, MD.   I have reviewed the above documentation for accuracy and completeness, and I agree with the above. - Coralie Common, MD

## 2020-11-23 ENCOUNTER — Ambulatory Visit (INDEPENDENT_AMBULATORY_CARE_PROVIDER_SITE_OTHER): Payer: Federal, State, Local not specified - PPO | Admitting: Family Medicine

## 2020-11-23 ENCOUNTER — Other Ambulatory Visit: Payer: Self-pay

## 2020-11-23 ENCOUNTER — Encounter (INDEPENDENT_AMBULATORY_CARE_PROVIDER_SITE_OTHER): Payer: Self-pay | Admitting: Family Medicine

## 2020-11-23 VITALS — BP 128/79 | HR 94 | Temp 97.8°F | Ht 63.0 in | Wt 186.0 lb

## 2020-11-23 DIAGNOSIS — E1159 Type 2 diabetes mellitus with other circulatory complications: Secondary | ICD-10-CM

## 2020-11-23 DIAGNOSIS — E1169 Type 2 diabetes mellitus with other specified complication: Secondary | ICD-10-CM

## 2020-11-23 DIAGNOSIS — I152 Hypertension secondary to endocrine disorders: Secondary | ICD-10-CM

## 2020-11-23 DIAGNOSIS — E785 Hyperlipidemia, unspecified: Secondary | ICD-10-CM

## 2020-11-23 DIAGNOSIS — E1165 Type 2 diabetes mellitus with hyperglycemia: Secondary | ICD-10-CM

## 2020-11-23 DIAGNOSIS — Z6841 Body Mass Index (BMI) 40.0 and over, adult: Secondary | ICD-10-CM

## 2020-11-23 NOTE — Progress Notes (Signed)
Chief Complaint:   OBESITY Debbie Castro is here to discuss her progress with her obesity treatment plan along with follow-up of her obesity related diagnoses. Debbie Castro is on the Category 2 Plan and states she is following her eating plan approximately 80% of the time. Debbie Castro states she is walking 1 mile 5-7 times per week.  Today's visit was #: 8 Starting weight: 226 lbs Starting date: 07/18/2020 Today's weight: 186 lbs Today's date: 11/23/2020 Total lbs lost to date: 40 Total lbs lost since last in-office visit: 5  Interim History: Debbie Castro is sticking with the diet and trying variety. She is tolerating Ozempic 0.25 mg well. Labs to be drawn by PCP and pt is looking into a gym membership. She has Thanksgiving plans for [place] dinner.  Subjective:   1. Hyperlipidemia associated with type 2 diabetes mellitus (HCC) At goal- LDL 29. Debbie Castro is on Zetia and Crestor and denies myalgias.   2. Type 2 diabetes mellitus with hyperglycemia, without long-term current use of insulin (HCC) Debbie Castro is tolerating Ozempic 0.25 mg weekly. She is also on Metformin.  3. Hypertension associated with diabetes ([place]) BP at goal of less than 120/80.  Assessment/Plan:   1. Hyperlipidemia associated with type 2 diabetes mellitus ([place]) Continue category 2 plan and continue Crestor and Zetia. Lipid panel to be check with PCP in December 2022. Cardiovascular risk and specific lipid/LDL goals reviewed.  We discussed several lifestyle modifications today and Debbie Castro will continue to work on diet, exercise and weight loss efforts. Orders and follow up as documented in patient record.   Counseling Intensive lifestyle modifications are the first line treatment for this issue. Dietary changes: Increase soluble fiber. Decrease simple carbohydrates. Exercise changes: Moderate to vigorous-intensity aerobic activity 150 minutes per week if tolerated. Lipid-lowering medications: see documented in medical record.  2. Type 2  diabetes mellitus with hyperglycemia, without long-term current use of insulin (HCC) Good blood sugar control is important to decrease the likelihood of diabetic complications such as nephropathy, neuropathy, limb loss, blindness, coronary artery disease, and death. Intensive lifestyle modification including diet, exercise and weight loss are the first line of treatment for diabetes. Debbie Castro will continue Ozempic 0.25 mg weekly and Metformin 1 mg daily.  3. Hypertension associated with diabetes ([place]) Debbie Castro is working on healthy weight loss and exercise to improve blood pressure control. We will watch for signs of hypotension as she continues her lifestyle modifications. Continue Maxzide-25 and follow up with PCP in December for labs.  4. Obesity with current BMI of 32.9  Debbie Castro is currently in the action stage of change. As such, her goal is to continue with weight loss efforts. She has agreed to the Category 2 Plan.   Exercise goals:  As is  Behavioral modification strategies: meal planning and cooking strategies, holiday eating strategies , and planning for success.  Debbie Castro has agreed to follow-up with our clinic in 3-4 weeks. She was informed of the importance of frequent follow-up visits to maximize her success with intensive lifestyle modifications for her multiple health conditions.   Objective:   Blood pressure 128/79, pulse 94, temperature 97.8 F (36.6 C), height 5\' 3"  (1.6 m), weight 186 lb (84.4 kg), SpO2 97 %. Body mass index is 32.95 kg/m.  General: Cooperative, alert, well developed, in no acute distress. HEENT: Conjunctivae and lids unremarkable. Cardiovascular: Regular rhythm.  Lungs: Normal work of breathing. Neurologic: No focal deficits.   Lab Results  Component Value Date   CREATININE 0.85 07/18/2020   BUN  19 07/18/2020   NA 140 07/18/2020   K 4.1 07/18/2020   CL 99 07/18/2020   CO2 26 07/18/2020   Lab Results  Component Value Date   ALT 36 (H) 07/18/2020    AST 26 07/18/2020   ALKPHOS 63 07/18/2020   BILITOT 0.4 07/18/2020   No results found for: HGBA1C Lab Results  Component Value Date   INSULIN 15.1 07/18/2020   Lab Results  Component Value Date   TSH 1.230 07/18/2020   Lab Results  Component Value Date   CHOL 83 (L) 07/18/2020   HDL 34 (L) 07/18/2020   LDLCALC 29 07/18/2020   TRIG 104 07/18/2020   Lab Results  Component Value Date   VD25OH 71.8 07/18/2020   Lab Results  Component Value Date   WBC 7.3 07/18/2020   HGB 13.6 07/18/2020   HCT 43.0 07/18/2020   MCV 91 07/18/2020   PLT 305 07/18/2020    Attestation Statements:   Reviewed by clinician on day of visit: allergies, medications, problem list, medical history, surgical history, family history, social history, and previous encounter notes.  Coral Ceo, CMA, am acting as transcriptionist for Coralie Common, MD.  I have reviewed the above documentation for accuracy and completeness, and I agree with the above. - Coralie Common, MD

## 2020-12-06 DIAGNOSIS — E119 Type 2 diabetes mellitus without complications: Secondary | ICD-10-CM | POA: Diagnosis not present

## 2020-12-13 ENCOUNTER — Other Ambulatory Visit: Payer: Self-pay | Admitting: Family Medicine

## 2020-12-13 DIAGNOSIS — Z1231 Encounter for screening mammogram for malignant neoplasm of breast: Secondary | ICD-10-CM

## 2020-12-13 LAB — VITAMIN D 25 HYDROXY (VIT D DEFICIENCY, FRACTURES): Vit D, 25-Hydroxy: 77.8

## 2020-12-13 LAB — HEPATIC FUNCTION PANEL
ALT: 52 — AB (ref 7–35)
AST: 56 — AB (ref 13–35)
Alkaline Phosphatase: 87 (ref 25–125)
Bilirubin, Total: 0.7

## 2020-12-13 LAB — COMPREHENSIVE METABOLIC PANEL
Albumin: 4.6 (ref 3.5–5.0)
Calcium: 9.7 (ref 8.7–10.7)
Globulin: 2.3

## 2020-12-13 LAB — TSH: TSH: 0.99 (ref 0.41–5.90)

## 2020-12-13 LAB — LIPID PANEL
Cholesterol: 72 (ref 0–200)
HDL: 30 — AB (ref 35–70)
LDL Cholesterol: 19
Triglycerides: 148 (ref 40–160)

## 2020-12-13 LAB — CBC: RBC: 4.89 (ref 3.87–5.11)

## 2020-12-13 LAB — CBC AND DIFFERENTIAL
HCT: 43 (ref 36–46)
HCT: 43 (ref 36–46)
Hemoglobin: 14.1 (ref 12.0–16.0)
Neutrophils Absolute: 3.89
Platelets: 241 (ref 150–399)
WBC: 7.1

## 2020-12-13 LAB — BASIC METABOLIC PANEL
BUN: 21 (ref 4–21)
CO2: 33 — AB (ref 13–22)
Chloride: 96 — AB (ref 99–108)
Creatinine: 1 (ref 0.5–1.1)
Creatinine: 1 (ref 0.5–1.1)
Glucose: 95
Potassium: 3.5 (ref 3.4–5.3)
Sodium: 138 (ref 137–147)

## 2020-12-13 LAB — HEMOGLOBIN A1C: Hemoglobin A1C: 5.5

## 2020-12-20 ENCOUNTER — Encounter (INDEPENDENT_AMBULATORY_CARE_PROVIDER_SITE_OTHER): Payer: Self-pay | Admitting: Family Medicine

## 2020-12-20 ENCOUNTER — Other Ambulatory Visit: Payer: Self-pay

## 2020-12-20 ENCOUNTER — Ambulatory Visit (INDEPENDENT_AMBULATORY_CARE_PROVIDER_SITE_OTHER): Payer: Federal, State, Local not specified - PPO | Admitting: Family Medicine

## 2020-12-20 VITALS — BP 107/72 | HR 75 | Temp 97.8°F | Ht 63.0 in | Wt 181.0 lb

## 2020-12-20 DIAGNOSIS — E1165 Type 2 diabetes mellitus with hyperglycemia: Secondary | ICD-10-CM

## 2020-12-20 DIAGNOSIS — E1159 Type 2 diabetes mellitus with other circulatory complications: Secondary | ICD-10-CM | POA: Diagnosis not present

## 2020-12-20 DIAGNOSIS — I152 Hypertension secondary to endocrine disorders: Secondary | ICD-10-CM | POA: Diagnosis not present

## 2020-12-20 DIAGNOSIS — Z6841 Body Mass Index (BMI) 40.0 and over, adult: Secondary | ICD-10-CM

## 2020-12-20 MED ORDER — OZEMPIC (0.25 OR 0.5 MG/DOSE) 2 MG/1.5ML ~~LOC~~ SOPN: 0.2500 mg | PEN_INJECTOR | SUBCUTANEOUS | 0 refills | Status: DC

## 2020-12-20 NOTE — Progress Notes (Signed)
Chief Complaint:   OBESITY Debbie Castro is here to discuss her progress with her obesity treatment plan along with follow-up of her obesity related diagnoses. Debbie Castro is on the Category 2 Plan and states she is following her eating plan approximately 50% of the time. Debbie Castro states she is walking 1 mile 7 times per week.  Today's visit was #: 9 Starting weight: 226 lbs Starting date: 07/18/2020 Today's weight: 181 lbs Today's date: 12/20/2020 Total lbs lost to date: 45 Total lbs lost since last in-office visit: 5  Interim History: Debbie Castro has had some time off with Thanksgiving holiday and has some time off during December. She has been a bit less adherent to the plan due to eating out more. She is going to the beach this weekend for historic home tour. She is looking forward to the walking tour of historic homes. Pt joined the gym but hasn't started going yet.  Subjective:   1. Hypertension associated with diabetes ([place]) BP very well controlled. Pt is on Maxzide-25.  2. Type 2 diabetes mellitus with hyperglycemia, without long-term current use of insulin (HCC) Debbie Castro is doing well on Ozempic with no side effects reported. She has noticed more control in quantity of food she can take in.   Assessment/Plan:   1. Hypertension associated with diabetes ([place]) Debbie Castro is working on healthy weight loss and exercise to improve blood pressure control. We will watch for signs of hypotension as she continues her lifestyle modifications. Follow up on BP at next appt. If BP is still well controlled, we will decrease medication.  2. Type 2 diabetes mellitus with hyperglycemia, without long-term current use of insulin (HCC) Good blood sugar control is important to decrease the likelihood of diabetic complications such as nephropathy, neuropathy, limb loss, blindness, coronary artery disease, and death. Intensive lifestyle modification including diet, exercise and weight loss are the first line of treatment  for diabetes. Continue Ozempic 0.25 mg weekly.   Refill- Semaglutide,0.25 or 0.5MG /DOS, (OZEMPIC, 0.25 OR 0.5 MG/DOSE,) 2 MG/1.5ML SOPN; Inject 0.25 mg into the skin once a week.  Dispense: 1.5 mL; Refill: 0  3. Obesity with current BMI of 32.2  Debbie Castro is currently in the action stage of change. As such, her goal is to continue with weight loss efforts. She has agreed to the Category 2 Plan.   Exercise goals: All adults should avoid inactivity. Some physical activity is better than none, and adults who participate in any amount of physical activity gain some health benefits.  Behavioral modification strategies: increasing lean protein intake, meal planning and cooking strategies, travel eating strategies, and holiday eating strategies .  Debbie Castro has agreed to follow-up with our clinic in 4 weeks. She was informed of the importance of frequent follow-up visits to maximize her success with intensive lifestyle modifications for her multiple health conditions.   Objective:   Blood pressure 107/72, pulse 75, temperature 97.8 F (36.6 C), height 5\' 3"  (1.6 m), weight 181 lb (82.1 kg), SpO2 98 %. Body mass index is 32.06 kg/m.  General: Cooperative, alert, well developed, in no acute distress. HEENT: Conjunctivae and lids unremarkable. Cardiovascular: Regular rhythm.  Lungs: Normal work of breathing. Neurologic: No focal deficits.   Lab Results  Component Value Date   CREATININE 0.85 07/18/2020   BUN 19 07/18/2020   NA 140 07/18/2020   K 4.1 07/18/2020   CL 99 07/18/2020   CO2 26 07/18/2020   Lab Results  Component Value Date   ALT 36 (H) 07/18/2020  AST 26 07/18/2020   ALKPHOS 63 07/18/2020   BILITOT 0.4 07/18/2020   No results found for: HGBA1C Lab Results  Component Value Date   INSULIN 15.1 07/18/2020   Lab Results  Component Value Date   TSH 1.230 07/18/2020   Lab Results  Component Value Date   CHOL 83 (L) 07/18/2020   HDL 34 (L) 07/18/2020   LDLCALC 29  07/18/2020   TRIG 104 07/18/2020   Lab Results  Component Value Date   VD25OH 71.8 07/18/2020   Lab Results  Component Value Date   WBC 7.3 07/18/2020   HGB 13.6 07/18/2020   HCT 43.0 07/18/2020   MCV 91 07/18/2020   PLT 305 07/18/2020    Attestation Statements:   Reviewed by clinician on day of visit: allergies, medications, problem list, medical history, surgical history, family history, social history, and previous encounter notes.  Coral Ceo, CMA, am acting as transcriptionist for Coralie Common, MD.  I have reviewed the above documentation for accuracy and completeness, and I agree with the above. - Coralie Common, MD

## 2020-12-27 ENCOUNTER — Other Ambulatory Visit: Payer: Self-pay | Admitting: Family Medicine

## 2020-12-27 DIAGNOSIS — E663 Overweight: Secondary | ICD-10-CM | POA: Diagnosis not present

## 2020-12-27 DIAGNOSIS — I10 Essential (primary) hypertension: Secondary | ICD-10-CM | POA: Diagnosis not present

## 2020-12-27 DIAGNOSIS — Z0000 Encounter for general adult medical examination without abnormal findings: Secondary | ICD-10-CM | POA: Diagnosis not present

## 2020-12-27 DIAGNOSIS — N6320 Unspecified lump in the left breast, unspecified quadrant: Secondary | ICD-10-CM

## 2020-12-27 DIAGNOSIS — E782 Mixed hyperlipidemia: Secondary | ICD-10-CM | POA: Diagnosis not present

## 2020-12-27 DIAGNOSIS — E119 Type 2 diabetes mellitus without complications: Secondary | ICD-10-CM | POA: Diagnosis not present

## 2020-12-28 ENCOUNTER — Other Ambulatory Visit (INDEPENDENT_AMBULATORY_CARE_PROVIDER_SITE_OTHER): Payer: Self-pay

## 2021-01-01 DIAGNOSIS — D1801 Hemangioma of skin and subcutaneous tissue: Secondary | ICD-10-CM | POA: Diagnosis not present

## 2021-01-01 DIAGNOSIS — Z85828 Personal history of other malignant neoplasm of skin: Secondary | ICD-10-CM | POA: Diagnosis not present

## 2021-01-01 DIAGNOSIS — L820 Inflamed seborrheic keratosis: Secondary | ICD-10-CM | POA: Diagnosis not present

## 2021-01-01 DIAGNOSIS — B351 Tinea unguium: Secondary | ICD-10-CM | POA: Diagnosis not present

## 2021-01-01 DIAGNOSIS — L821 Other seborrheic keratosis: Secondary | ICD-10-CM | POA: Diagnosis not present

## 2021-01-12 ENCOUNTER — Ambulatory Visit: Payer: Self-pay

## 2021-01-18 ENCOUNTER — Encounter (INDEPENDENT_AMBULATORY_CARE_PROVIDER_SITE_OTHER): Payer: Self-pay | Admitting: Family Medicine

## 2021-01-18 ENCOUNTER — Other Ambulatory Visit: Payer: Self-pay

## 2021-01-18 ENCOUNTER — Ambulatory Visit (INDEPENDENT_AMBULATORY_CARE_PROVIDER_SITE_OTHER): Payer: Federal, State, Local not specified - PPO | Admitting: Family Medicine

## 2021-01-18 VITALS — BP 94/68 | HR 80 | Temp 97.8°F | Ht 63.0 in | Wt 178.0 lb

## 2021-01-18 DIAGNOSIS — E1165 Type 2 diabetes mellitus with hyperglycemia: Secondary | ICD-10-CM

## 2021-01-18 DIAGNOSIS — I152 Hypertension secondary to endocrine disorders: Secondary | ICD-10-CM

## 2021-01-18 DIAGNOSIS — Z6831 Body mass index (BMI) 31.0-31.9, adult: Secondary | ICD-10-CM

## 2021-01-18 DIAGNOSIS — E1159 Type 2 diabetes mellitus with other circulatory complications: Secondary | ICD-10-CM

## 2021-01-22 NOTE — Progress Notes (Signed)
Chief Complaint:   OBESITY Debbie Castro is here to discuss her progress with her obesity treatment plan along with follow-up of her obesity related diagnoses. Debbie Castro is on the Category 2 Plan and states she is following her eating plan approximately 50% of the time. Debbie Castro states she is walking 1 mile 3-4 times per week.  Today's visit was #: 10 Starting weight: 226 lbs Starting date: 07/18/2020 Today's weight: 178 lbs Today's date: 01/18/2021 Total lbs lost to date: 48 Total lbs lost since last in-office visit: 3  Interim History: Pt had an okay holiday- many holiday gatherings (4 of which were at her house). She may have done some indulgent eating instead of getting all nutrition in. She has no upcoming plans for the month of January so she wants to recommit. Pt's birthday is tomorrow.   Subjective:   1. Type 2 diabetes mellitus with hyperglycemia, without long-term current use of insulin (HCC) Pt is doing well om metformin and Ozempic. She has used 3 doses in Ozempic pen. She thinks her recent A1c was 5.5.  2. Hypertension associated with diabetes (HCC) BP borderline low today at 94/68. She denies dizziness/lightheadedness.  Assessment/Plan:   1. Type 2 diabetes mellitus with hyperglycemia, without long-term current use of insulin (HCC) Good blood sugar control is important to decrease the likelihood of diabetic complications such as nephropathy, neuropathy, limb loss, blindness, coronary artery disease, and death. Intensive lifestyle modification including diet, exercise and weight loss are the first line of treatment for diabetes. Continue current treatment plan.  2. Hypertension associated with diabetes ([place]) Debbie Castro is working on healthy weight loss and exercise to improve blood pressure control. We will watch for signs of hypotension as she continues her lifestyle modifications. Discontinue Maxzide and follow up on BP at next appt.  3. Obesity with current BMI of 31.6  Debbie Castro is  currently in the action stage of change. As such, her goal is to continue with weight loss efforts. She has agreed to the Category 2 Plan.   Exercise goals:  Pt is to contemplate resistance training to start in February.  Behavioral modification strategies: increasing lean protein intake, meal planning and cooking strategies, keeping healthy foods in the home, celebration eating strategies, and planning for success.  Debbie Castro has agreed to follow-up with our clinic in 3 weeks. She was informed of the importance of frequent follow-up visits to maximize her success with intensive lifestyle modifications for her multiple health conditions.   Objective:   Blood pressure 94/68, pulse 80, temperature 97.8 F (36.6 C), height 5\' 3"  (1.6 m), weight 178 lb (80.7 kg), SpO2 98 %. Body mass index is 31.53 kg/m.  General: Cooperative, alert, well developed, in no acute distress. HEENT: Conjunctivae and lids unremarkable. Cardiovascular: Regular rhythm.  Lungs: Normal work of breathing. Neurologic: No focal deficits.   Lab Results  Component Value Date   CREATININE 1.0 12/13/2020   BUN 21 12/13/2020   NA 138 12/13/2020   K 3.5 12/13/2020   CL 96 (A) 12/13/2020   CO2 33 (A) 12/13/2020   Lab Results  Component Value Date   ALT 52 (A) 12/13/2020   AST 56 (A) 12/13/2020   ALKPHOS 87 12/13/2020   BILITOT 0.4 07/18/2020   Lab Results  Component Value Date   HGBA1C 5.5 12/13/2020   Lab Results  Component Value Date   INSULIN 15.1 07/18/2020   Lab Results  Component Value Date   TSH 0.99 12/13/2020   Lab Results  Component  Value Date   CHOL 72 12/13/2020   HDL 30 (A) 12/13/2020   LDLCALC 19 12/13/2020   TRIG 148 12/13/2020   Lab Results  Component Value Date   VD25OH 77.8 12/13/2020   VD25OH 71.8 07/18/2020   Lab Results  Component Value Date   WBC 7.1 12/13/2020   HGB 14.1 12/13/2020   HCT 43 12/13/2020   MCV 91 07/18/2020   PLT 241 12/13/2020    Attestation Statements:    Reviewed by clinician on day of visit: allergies, medications, problem list, medical history, surgical history, family history, social history, and previous encounter notes.  Coral Ceo, CMA, am acting as transcriptionist for Coralie Common, MD.   I have reviewed the above documentation for accuracy and completeness, and I agree with the above. - Coralie Common, MD

## 2021-02-06 ENCOUNTER — Other Ambulatory Visit: Payer: Self-pay

## 2021-02-06 ENCOUNTER — Ambulatory Visit
Admission: RE | Admit: 2021-02-06 | Discharge: 2021-02-06 | Disposition: A | Discharge status: Home / Self Care | Payer: Federal, State, Local not specified - PPO | Source: Ambulatory Visit | Attending: Family Medicine | Admitting: Family Medicine

## 2021-02-06 ENCOUNTER — Ambulatory Visit
Admission: RE | Admit: 2021-02-06 | Discharge: 2021-02-06 | Disposition: A | Discharge status: Home / Self Care | Payer: Self-pay | Source: Ambulatory Visit | Attending: Family Medicine | Admitting: Family Medicine

## 2021-02-06 DIAGNOSIS — N6320 Unspecified lump in the left breast, unspecified quadrant: Secondary | ICD-10-CM

## 2021-02-06 DIAGNOSIS — R922 Inconclusive mammogram: Secondary | ICD-10-CM | POA: Diagnosis not present

## 2021-02-07 ENCOUNTER — Encounter: Payer: Self-pay | Admitting: Gastroenterology

## 2021-02-08 ENCOUNTER — Ambulatory Visit (INDEPENDENT_AMBULATORY_CARE_PROVIDER_SITE_OTHER): Payer: Federal, State, Local not specified - PPO | Admitting: Family Medicine

## 2021-02-08 ENCOUNTER — Other Ambulatory Visit: Payer: Self-pay

## 2021-02-08 ENCOUNTER — Encounter (INDEPENDENT_AMBULATORY_CARE_PROVIDER_SITE_OTHER): Payer: Self-pay | Admitting: Family Medicine

## 2021-02-08 VITALS — BP 115/75 | HR 78 | Temp 98.1°F | Ht 63.0 in | Wt 180.0 lb

## 2021-02-08 DIAGNOSIS — E1159 Type 2 diabetes mellitus with other circulatory complications: Secondary | ICD-10-CM | POA: Diagnosis not present

## 2021-02-08 DIAGNOSIS — Z6831 Body mass index (BMI) 31.0-31.9, adult: Secondary | ICD-10-CM

## 2021-02-08 DIAGNOSIS — I152 Hypertension secondary to endocrine disorders: Secondary | ICD-10-CM

## 2021-02-08 DIAGNOSIS — E669 Obesity, unspecified: Secondary | ICD-10-CM | POA: Diagnosis not present

## 2021-02-08 DIAGNOSIS — E1165 Type 2 diabetes mellitus with hyperglycemia: Secondary | ICD-10-CM

## 2021-02-08 MED ORDER — OZEMPIC (0.25 OR 0.5 MG/DOSE) 2 MG/1.5ML ~~LOC~~ SOPN: 0.2500 mg | PEN_INJECTOR | SUBCUTANEOUS | 0 refills | Status: DC

## 2021-02-08 NOTE — Progress Notes (Signed)
Chief Complaint:   OBESITY Debbie Castro is here to discuss her progress with her obesity treatment plan along with follow-up of her obesity related diagnoses. Debbie Castro is on the Category 2 Plan and states she is following her eating plan approximately 75% of the time. Debbie Castro states she is going to the gym 45 minutes 5 times per week.  Today's visit was #: 11 Starting weight: 226 lbs Starting date: 07/18/2020 Today's weight: 180 lbs Today's date: 02/08/2021 Total lbs lost to date: 46 Total lbs lost since last in-office visit: 0  Interim History: Pt has made a few indulgences over the last few weeks but is very mindful of quantity. She is not getting everything in. Pt recognizes getting all protein in is difficult for her. She has not plans for February. Pt may be interested in putting food together herself.  Subjective:   1. Type 2 diabetes mellitus with hyperglycemia, without long-term current use of insulin (HCC) Pt is on Ozempic 0.25 mg with no GI side effects.  2. Hypertension associated with diabetes ([place]) BP very well controlled. Pt denies chest pain/chest pressure/headache. She can feel a difference not being on meds.  Assessment/Plan:   1. Type 2 diabetes mellitus with hyperglycemia, without long-term current use of insulin (HCC) Good blood sugar control is important to decrease the likelihood of diabetic complications such as nephropathy, neuropathy, limb loss, blindness, coronary artery disease, and death. Intensive lifestyle modification including diet, exercise and weight loss are the first line of treatment for diabetes.   Refill- Semaglutide,0.25 or 0.5MG /DOS, (OZEMPIC, 0.25 OR 0.5 MG/DOSE,) 2 MG/1.5ML SOPN; Inject 0.25 mg into the skin once a week.  Dispense: 2.41 mL; Refill: 0  2. Hypertension associated with diabetes ([place]) Debbie Castro is working on healthy weight loss and exercise to improve blood pressure control. We will watch for signs of hypotension as she continues her  lifestyle modifications. Follow up on BP at next appt. BP controlled.  3. Obesity with current BMI of 31.6 Debbie Castro is currently in the action stage of change. As such, her goal is to continue with weight loss efforts. She has agreed to the Category 2 Plan and keeping a food journal and adhering to recommended goals of 400-500 calories and 35+ grams protein with supper.   Exercise goals:  As is  Behavioral modification strategies: increasing lean protein intake, meal planning and cooking strategies, ways to avoid boredom eating, and keeping a strict food journal.  Debbie Castro has agreed to follow-up with our clinic in 3-4 weeks. She was informed of the importance of frequent follow-up visits to maximize her success with intensive lifestyle modifications for her multiple health conditions.   Objective:   Blood pressure 115/75, pulse 78, temperature 98.1 F (36.7 C), height 5\' 3"  (1.6 m), weight 180 lb (81.6 kg), SpO2 98 %. Body mass index is 31.89 kg/m.  General: Cooperative, alert, well developed, in no acute distress. HEENT: Conjunctivae and lids unremarkable. Cardiovascular: Regular rhythm.  Lungs: Normal work of breathing. Neurologic: No focal deficits.   Lab Results  Component Value Date   CREATININE 1.0 12/13/2020   BUN 21 12/13/2020   NA 138 12/13/2020   K 3.5 12/13/2020   CL 96 (A) 12/13/2020   CO2 33 (A) 12/13/2020   Lab Results  Component Value Date   ALT 52 (A) 12/13/2020   AST 56 (A) 12/13/2020   ALKPHOS 87 12/13/2020   BILITOT 0.4 07/18/2020   Lab Results  Component Value Date   HGBA1C 5.5 12/13/2020  Lab Results  Component Value Date   INSULIN 15.1 07/18/2020   Lab Results  Component Value Date   TSH 0.99 12/13/2020   Lab Results  Component Value Date   CHOL 72 12/13/2020   HDL 30 (A) 12/13/2020   LDLCALC 19 12/13/2020   TRIG 148 12/13/2020   Lab Results  Component Value Date   VD25OH 77.8 12/13/2020   VD25OH 71.8 07/18/2020   Lab Results   Component Value Date   WBC 7.1 12/13/2020   HGB 14.1 12/13/2020   HCT 43 12/13/2020   MCV 91 07/18/2020   PLT 241 12/13/2020    Attestation Statements:   Reviewed by clinician on day of visit: allergies, medications, problem list, medical history, surgical history, family history, social history, and previous encounter notes.  Coral Ceo, CMA, am acting as transcriptionist for Coralie Common, MD.   I have reviewed the above documentation for accuracy and completeness, and I agree with the above. - Coralie Common, MD

## 2021-02-15 ENCOUNTER — Encounter: Payer: Self-pay | Admitting: Gastroenterology

## 2021-02-20 ENCOUNTER — Other Ambulatory Visit (INDEPENDENT_AMBULATORY_CARE_PROVIDER_SITE_OTHER): Payer: Self-pay

## 2021-03-07 ENCOUNTER — Encounter (INDEPENDENT_AMBULATORY_CARE_PROVIDER_SITE_OTHER): Payer: Self-pay | Admitting: Family Medicine

## 2021-03-07 ENCOUNTER — Ambulatory Visit (INDEPENDENT_AMBULATORY_CARE_PROVIDER_SITE_OTHER): Payer: Federal, State, Local not specified - PPO | Admitting: Family Medicine

## 2021-03-07 ENCOUNTER — Other Ambulatory Visit: Payer: Self-pay

## 2021-03-07 VITALS — BP 132/80 | HR 62 | Temp 97.9°F | Ht 63.0 in | Wt 174.0 lb

## 2021-03-07 DIAGNOSIS — I152 Hypertension secondary to endocrine disorders: Secondary | ICD-10-CM | POA: Diagnosis not present

## 2021-03-07 DIAGNOSIS — E1159 Type 2 diabetes mellitus with other circulatory complications: Secondary | ICD-10-CM | POA: Diagnosis not present

## 2021-03-07 DIAGNOSIS — E1165 Type 2 diabetes mellitus with hyperglycemia: Secondary | ICD-10-CM | POA: Diagnosis not present

## 2021-03-07 DIAGNOSIS — Z6830 Body mass index (BMI) 30.0-30.9, adult: Secondary | ICD-10-CM

## 2021-03-07 DIAGNOSIS — E669 Obesity, unspecified: Secondary | ICD-10-CM

## 2021-03-07 NOTE — Progress Notes (Signed)
Chief Complaint:   OBESITY Debbie Castro is here to discuss her progress with her obesity treatment plan along with follow-up of her obesity related diagnoses. Debbie Castro is on the Category 2 Plan and keeping a food journal and adhering to recommended goals of 400-500 calories and 35+ grams protein and states she is following her eating plan approximately 50% of the time. Debbie Castro states she is walking and going to the gym 45-60 minutes 4-5 times per week.  Today's visit was #: 12 Starting weight: 226 lbs Starting date: 07/18/2020 Today's weight: 174 lbs Today's date: 03/07/2021 Total lbs lost to date: 52 Total lbs lost since last in-office visit: 6  Interim History: Debbie Castro has been working and trying to stay on plan. She really has to pay attention to food intake. She is sometimes eating off plan and other times making similar recipes to recipe packs given. Pt has no upcoming plans, except for the birthday of her sister in April.  Subjective:   1. Hypertension associated with diabetes ([place]) BP well controlled. Pt is off BP meds. Debbie Castro denies chest pain/chest pressure/headache.  2. Type 2 diabetes mellitus with hyperglycemia, without long-term current use of insulin (HCC) Pt is on Ozempic and Glucophage and is doing well with no GI side effects.  Assessment/Plan:   1. Hypertension associated with diabetes (Debbie Castro) Debbie Castro is working on healthy weight loss and exercise to improve blood pressure control. We will watch for signs of hypotension as she continues her lifestyle modifications. Continue to monitor with no change in tx. BP continues to stay within normal limits. At goal.  2. Type 2 diabetes mellitus with hyperglycemia, without long-term current use of insulin (HCC) Good blood sugar control is important to decrease the likelihood of diabetic complications such as nephropathy, neuropathy, limb loss, blindness, coronary artery disease, and death. Intensive lifestyle modification including diet,  exercise and weight loss are the first line of treatment for diabetes. Continue current meds with no change in dose. No refill needed.  3. Obesity with current BMI of 30.9 Debbie Castro is currently in the action stage of change. As such, her goal is to continue with weight loss efforts. She has agreed to the Category 2 Plan and keeping a food journal and adhering to recommended goals of 400-500 calories and 35+ grams protein with supper.   Exercise goals:  As is  Behavioral modification strategies: increasing lean protein intake, meal planning and cooking strategies, keeping healthy foods in the home, and planning for success.  Debbie Castro has agreed to follow-up with our clinic in 4-5 weeks. She was informed of the importance of frequent follow-up visits to maximize her success with intensive lifestyle modifications for her multiple health conditions.   Objective:   Blood pressure 132/80, pulse 62, temperature 97.9 F (36.6 C), height 5\' 3"  (1.6 m), weight 174 lb (78.9 kg), SpO2 98 %. Body mass index is 30.82 kg/m.  General: Cooperative, alert, well developed, in no acute distress. HEENT: Conjunctivae and lids unremarkable. Cardiovascular: Regular rhythm.  Lungs: Normal work of breathing. Neurologic: No focal deficits.   Lab Results  Component Value Date   CREATININE 1.0 12/13/2020   CREATININE 1.0 12/13/2020   BUN 21 12/13/2020   NA 138 12/13/2020   K 3.5 12/13/2020   CL 96 (A) 12/13/2020   CO2 33 (A) 12/13/2020   Lab Results  Component Value Date   ALT 52 (A) 12/13/2020   AST 56 (A) 12/13/2020   ALKPHOS 87 12/13/2020   BILITOT 0.4 07/18/2020  Lab Results  Component Value Date   HGBA1C 5.5 12/13/2020   Lab Results  Component Value Date   INSULIN 15.1 07/18/2020   Lab Results  Component Value Date   TSH 0.99 12/13/2020   Lab Results  Component Value Date   CHOL 72 12/13/2020   HDL 30 (A) 12/13/2020   LDLCALC 19 12/13/2020   TRIG 148 12/13/2020   Lab Results   Component Value Date   VD25OH 77.8 12/13/2020   VD25OH 71.8 07/18/2020   Lab Results  Component Value Date   WBC 7.1 12/13/2020   HGB 14.1 12/13/2020   HCT 43 12/13/2020   HCT 43 12/13/2020   MCV 91 07/18/2020   PLT 241 12/13/2020     Attestation Statements:   Reviewed by clinician on day of visit: allergies, medications, problem list, medical history, surgical history, family history, social history, and previous encounter notes.  Coral Ceo, CMA, am acting as transcriptionist for Coralie Common, MD.   I have reviewed the above documentation for accuracy and completeness, and I agree with the above. - Coralie Common, MD

## 2021-03-28 ENCOUNTER — Ambulatory Visit (AMBULATORY_SURGERY_CENTER): Payer: Federal, State, Local not specified - PPO | Admitting: *Deleted

## 2021-03-28 ENCOUNTER — Other Ambulatory Visit: Payer: Self-pay

## 2021-03-28 VITALS — Ht 63.0 in | Wt 171.4 lb

## 2021-03-28 DIAGNOSIS — Z1211 Encounter for screening for malignant neoplasm of colon: Secondary | ICD-10-CM

## 2021-03-28 MED ORDER — ONDANSETRON HCL 4 MG PO TABS: ORAL_TABLET | ORAL | 0 refills | Status: DC

## 2021-03-28 MED ORDER — NA SULFATE-K SULFATE-MG SULF 17.5-3.13-1.6 GM/177ML PO SOLN: 1.0000 | Freq: Once | ORAL | 0 refills | Status: AC

## 2021-03-28 NOTE — Progress Notes (Signed)
No egg or soy allergy known to patient  ?No issues known to pt with past sedation with any surgeries or procedures ?Patient denies ever being told they had issues or difficulty with intubation  ?No FH of Malignant Hyperthermia ?Pt is not on diet pills ?Pt is not on  home 02  ?Pt is not on blood thinners  ?Pt denies issues with constipation  ?No A fib or A flutter ? ?Pt is  vaccinated  for Covid  ? ?Due to the COVID-19 pandemic we are asking patients to follow certain guidelines in PV and the [place]   ?Pt aware of COVID protocols and [facility] guidelines  ? ?PV completed over the phone. Pt verified name, DOB, address and insurance during PV today.  ?Pt mailed instruction packet with copy of consent form to read and not return, and instructions.  ?Pt encouraged to call with questions or issues.  ?If pt has My chart, procedure instructions sent via My Chart   ? ?Pt. Confirmed constipation issues at times,2 day prep given along with zofran per order for concerns of  prep making her sick. ?

## 2021-04-04 ENCOUNTER — Other Ambulatory Visit: Payer: Self-pay

## 2021-04-04 ENCOUNTER — Ambulatory Visit (INDEPENDENT_AMBULATORY_CARE_PROVIDER_SITE_OTHER): Payer: Federal, State, Local not specified - PPO | Admitting: Family Medicine

## 2021-04-04 ENCOUNTER — Encounter (INDEPENDENT_AMBULATORY_CARE_PROVIDER_SITE_OTHER): Payer: Self-pay | Admitting: Family Medicine

## 2021-04-04 VITALS — BP 134/71 | HR 72 | Temp 97.8°F | Ht 63.0 in | Wt 172.0 lb

## 2021-04-04 DIAGNOSIS — E1169 Type 2 diabetes mellitus with other specified complication: Secondary | ICD-10-CM | POA: Diagnosis not present

## 2021-04-04 DIAGNOSIS — E669 Obesity, unspecified: Secondary | ICD-10-CM

## 2021-04-04 DIAGNOSIS — E785 Hyperlipidemia, unspecified: Secondary | ICD-10-CM

## 2021-04-04 DIAGNOSIS — E1165 Type 2 diabetes mellitus with hyperglycemia: Secondary | ICD-10-CM | POA: Diagnosis not present

## 2021-04-04 DIAGNOSIS — Z6830 Body mass index (BMI) 30.0-30.9, adult: Secondary | ICD-10-CM

## 2021-04-04 DIAGNOSIS — Z7985 Long-term (current) use of injectable non-insulin antidiabetic drugs: Secondary | ICD-10-CM

## 2021-04-04 MED ORDER — OZEMPIC (0.25 OR 0.5 MG/DOSE) 2 MG/1.5ML ~~LOC~~ SOPN: 0.2500 mg | PEN_INJECTOR | SUBCUTANEOUS | 0 refills | Status: DC

## 2021-04-10 NOTE — Progress Notes (Signed)
? ? ? ?Chief Complaint:  ? ?OBESITY ?Debbie Castro is here to discuss her progress with her obesity treatment plan along with follow-up of her obesity related diagnoses. [name] is on the Category 2 Plan and keeping a food journal and adhering to recommended goals of 400-500 calories and 135 plus grams of protein and states she is following her eating plan approximately 50% of the time. [name] states she is doing gym exercise for 45 minutes 5 times per week. ? ?Today's visit was #: 13 ?Starting weight: 226 lbs ?Starting date: 07/18/2020 ?Today's weight: 172 lbs ?Today's date: 04/04/2021 ?Total lbs lost to date: 54 lbs ?Total lbs lost since last in-office visit: 2 lbs ? ?Interim History: [name] has been following Category 2 with modification with recipes as substitute. She mad a chicken chili recipe. Sometimes doing leftovers for lunch and Keto cereal for breakfast. She is looking for other options for breakfast and lunch. She got protein powder and may want to incorporate that as adjunct. Syntrax lemon tea 100 calories/scoop for 23 grams of protein.  ? ?Subjective:  ? ?1. Type 2 diabetes mellitus with hyperglycemia, without long-term current use of insulin ([place]) ?[name] is on Ozempic 0.25 mg and Metformin 2000 mg per day. She denies gastro side effects.  ? ?2. Hyperlipidemia associated with type 2 diabetes mellitus ([place]) ?[name] is on Zetia and Crestor. She denies myalgias.  ? ?Assessment/Plan:  ? ?1. Type 2 diabetes mellitus with hyperglycemia, without long-term current use of insulin ([place]) ?We will refill Ozempic 0.25 mg subcutaneous weekly for 1 month with no refills .Good blood sugar control is important to decrease the likelihood of diabetic complications such as nephropathy, neuropathy, limb loss, blindness, coronary artery disease, and death. Intensive lifestyle modification including diet, exercise and weight loss are the first line of treatment for diabetes.  ? ?- Semaglutide,0.25 or 0.'5MG'$ /DOS, (OZEMPIC, 0.25 OR 0.5  MG/DOSE,) 2 MG/1.5ML SOPN; Inject 0.25 mg into the skin once a week.  Dispense: 2 mL; Refill: 0 ? ?2. Hyperlipidemia associated with type 2 diabetes mellitus ([place]) ?Cardiovascular risk and specific lipid/LDL goals reviewed.  Anessa will continue current medications. No changes in treatment. Labs are due in June.  We discussed several lifestyle modifications today and [name] will continue to work on diet, exercise and weight loss efforts. Orders and follow up as documented in patient record.  ? ?Counseling ?Intensive lifestyle modifications are the first line treatment for this issue. ?Dietary changes: Increase soluble fiber. Decrease simple carbohydrates. ?Exercise changes: Moderate to vigorous-intensity aerobic activity 150 minutes per week if tolerated. ?Lipid-lowering medications: see documented in medical record. ? ?3. Obesity with current BMI of 30.5 ?[name] is currently in the action stage of change. As such, her goal is to continue with weight loss efforts. She has agreed to the Category 2 Plan and keeping a food journal and adhering to recommended goals of 200-300  calories and 25 plus grams of protein for breakfast and 300-400 calories for lunch 30 plus grams of protein and 400-500 calories for dinner 35 plus grams of protein for supper.   ? ?Exercise goals:  As is. ? ?Behavioral modification strategies: increasing lean protein intake, meal planning and cooking strategies, keeping healthy foods in the home, planning for success, and keeping a strict food journal. ? ?[name] has agreed to follow-up with our clinic in 4-5 weeks. She was informed of the importance of frequent follow-up visits to maximize her success with intensive lifestyle modifications for her multiple health conditions.  ? ?Objective:  ? ?  Blood pressure 134/71, pulse 72, temperature 97.8 ?F (36.6 ?C), height '5\' 3"'$  (1.6 m), weight 172 lb (78 kg), SpO2 99 %. ?Body mass index is 30.47 kg/m?. ? ?General: Cooperative, alert, well developed, in no  acute distress. ?HEENT: Conjunctivae and lids unremarkable. ?Cardiovascular: Regular rhythm.  ?Lungs: Normal work of breathing. ?Neurologic: No focal deficits.  ? ?Lab Results  ?Component Value Date  ? CREATININE 1.0 12/13/2020  ? CREATININE 1.0 12/13/2020  ? BUN 21 12/13/2020  ? NA 138 12/13/2020  ? K 3.5 12/13/2020  ? CL 96 (A) 12/13/2020  ? CO2 33 (A) 12/13/2020  ? ?Lab Results  ?Component Value Date  ? ALT 52 (A) 12/13/2020  ? AST 56 (A) 12/13/2020  ? ALKPHOS 87 12/13/2020  ? BILITOT 0.4 07/18/2020  ? ?Lab Results  ?Component Value Date  ? HGBA1C 5.5 12/13/2020  ? ?Lab Results  ?Component Value Date  ? INSULIN 15.1 07/18/2020  ? ?Lab Results  ?Component Value Date  ? TSH 0.99 12/13/2020  ? ?Lab Results  ?Component Value Date  ? CHOL 72 12/13/2020  ? HDL 30 (A) 12/13/2020  ? [place] 19 12/13/2020  ? TRIG 148 12/13/2020  ? ?Lab Results  ?Component Value Date  ? VD25OH 77.8 12/13/2020  ? VD25OH 71.8 07/18/2020  ? ?Lab Results  ?Component Value Date  ? WBC 7.1 12/13/2020  ? HGB 14.1 12/13/2020  ? HCT 43 12/13/2020  ? HCT 43 12/13/2020  ? MCV 91 07/18/2020  ? PLT 241 12/13/2020  ? ?No results found for: IRON, TIBC, FERRITIN ? ?Attestation Statements:  ? ?Reviewed by clinician on day of visit: allergies, medications, problem list, medical history, surgical history, family history, social history, and previous encounter notes. ? ?I, Lizbeth Bark, RMA, am acting as transcriptionist for Coralie Common, MD ? ?I have reviewed the above documentation for accuracy and completeness, and I agree with the above. Coralie Common, MD ? ?

## 2021-04-11 ENCOUNTER — Encounter: Payer: Self-pay | Admitting: Gastroenterology

## 2021-04-11 ENCOUNTER — Other Ambulatory Visit: Payer: Self-pay

## 2021-04-11 ENCOUNTER — Ambulatory Visit (AMBULATORY_SURGERY_CENTER): Payer: Federal, State, Local not specified - PPO | Admitting: Gastroenterology

## 2021-04-11 VITALS — BP 146/76 | HR 69 | Temp 96.0°F | Resp 12 | Ht 63.0 in | Wt 171.4 lb

## 2021-04-11 DIAGNOSIS — Z1211 Encounter for screening for malignant neoplasm of colon: Secondary | ICD-10-CM | POA: Diagnosis not present

## 2021-04-11 DIAGNOSIS — D125 Benign neoplasm of sigmoid colon: Secondary | ICD-10-CM | POA: Diagnosis not present

## 2021-04-11 DIAGNOSIS — D120 Benign neoplasm of cecum: Secondary | ICD-10-CM

## 2021-04-11 DIAGNOSIS — D128 Benign neoplasm of rectum: Secondary | ICD-10-CM | POA: Diagnosis not present

## 2021-04-11 DIAGNOSIS — D124 Benign neoplasm of descending colon: Secondary | ICD-10-CM

## 2021-04-11 MED ORDER — SODIUM CHLORIDE 0.9 % IV SOLN: 500.0000 mL | Freq: Once | INTRAVENOUS | Status: DC

## 2021-04-11 NOTE — Op Note (Signed)
[facility] ?Patient Name: Debbie Castro ?Procedure Date: 04/11/2021 7:38 AM ?MRN: [id number] ?Endoscopist: Ladene Artist , MD ?Age: 61 ?Referring MD:  ?Date of Birth: [birth year] ?Gender: Female ?Account #: [id number] ?Procedure:                Colonoscopy ?Indications:              Screening for colorectal malignant neoplasm ?Medicines:                Monitored Anesthesia Care ?Procedure:                Pre-Anesthesia Assessment: ?                          - Prior to the procedure, a History and Physical  ?                          was performed, and patient medications and  ?                          allergies were reviewed. The patient's tolerance of  ?                          previous anesthesia was also reviewed. The risks  ?                          and benefits of the procedure and the sedation  ?                          options and risks were discussed with the patient.  ?                          All questions were answered, and informed consent  ?                          was obtained. Prior Anticoagulants: The patient has  ?                          taken no previous anticoagulant or antiplatelet  ?                          agents. ASA Grade Assessment: II - A patient with  ?                          mild systemic disease. After reviewing the risks  ?                          and benefits, the patient was deemed in  ?                          satisfactory condition to undergo the procedure. ?                          After obtaining informed consent, the colonoscope  ?  was passed under direct vision. Throughout the  ?                          procedure, the patient's blood pressure, pulse, and  ?                          oxygen saturations were monitored continuously. The  ?                          CF [id number] #[id number] was introduced through the anus  ?                          and advanced to the the cecum, identified by  ?                          appendiceal orifice  and ileocecal valve. The  ?                          ileocecal valve, appendiceal orifice, and rectum  ?                          were photographed. The quality of the bowel  ?                          preparation was good. The colonoscopy was performed  ?                          without difficulty. The patient tolerated the  ?                          procedure well. ?Scope In: 8:40:54 AM ?Scope Out: 9:00:06 AM ?Scope Withdrawal Time: 0 hours 16 minutes 21 seconds  ?Total Procedure Duration: 0 hours 19 minutes 12 seconds  ?Findings:                 The perianal and digital rectal examinations were  ?                          normal. ?                          Five sessile polyps were found in the rectum (2),  ?                          sigmoid colon, descending colon and cecum. The  ?                          polyps were 6 to 7 mm in size. These polyps were  ?                          removed with a cold snare. Resection and retrieval  ?                          were complete. ?  Multiple medium-mouthed diverticula were found in  ?                          the left colon. There was no evidence of  ?                          diverticular bleeding. ?                          Internal hemorrhoids were found during  ?                          retroflexion. The hemorrhoids were small and Grade  ?                          I (internal hemorrhoids that do not prolapse). ?                          The exam was otherwise without abnormality on  ?                          direct and retroflexion views. ?Complications:            No immediate complications. Estimated blood loss:  ?                          None. ?Estimated Blood Loss:     Estimated blood loss: none. ?Impression:               - Five 6 to 7 mm polyps in the rectum, in the  ?                          sigmoid colon, in the descending colon and in the  ?                          cecum, removed with a cold snare. Resected and  ?                           retrieved. ?                          - Mild diverticulosis in the left colon. ?                          - Internal hemorrhoids. ?                          - The examination was otherwise normal on direct  ?                          and retroflexion views. ?Recommendation:           - Repeat colonoscopy after studies are complete for  ?                          surveillance based on pathology results. ?                          -  Patient has a contact number available for  ?                          emergencies. The signs and symptoms of potential  ?                          delayed complications were discussed with the  ?                          patient. Return to normal activities tomorrow.  ?                          Written discharge instructions were provided to the  ?                          patient. ?                          - High fiber diet. ?                          - Continue present medications. ?                          - Await pathology results. ?Ladene Artist, MD ?04/11/2021 9:04:23 AM ?This report has been signed electronically. ?

## 2021-04-11 NOTE — Patient Instructions (Signed)
Handouts on polyps, diverticulosis, hemorrhoids, high fiber diet given. ? ?YOU HAD AN ENDOSCOPIC PROCEDURE TODAY AT [facility]:   Refer to the procedure report that was given to you for any specific questions about what was found during the examination.  If the procedure report does not answer your questions, please call your gastroenterologist to clarify.  If you requested that your care partner not be given the details of your procedure findings, then the procedure report has been included in a sealed envelope for you to review at your convenience later. ? ?YOU SHOULD EXPECT: Some feelings of bloating in the abdomen. Passage of more gas than usual.  Walking can help get rid of the air that was put into your GI tract during the procedure and reduce the bloating. If you had a lower endoscopy (such as a colonoscopy or flexible sigmoidoscopy) you may notice spotting of blood in your stool or on the toilet paper. If you underwent a bowel prep for your procedure, you may not have a normal bowel movement for a few days. ? ?Please Note:  You might notice some irritation and congestion in your nose or some drainage.  This is from the oxygen used during your procedure.  There is no need for concern and it should clear up in a day or so. ? ?SYMPTOMS TO REPORT IMMEDIATELY: ? ?Following lower endoscopy (colonoscopy or flexible sigmoidoscopy): ? Excessive amounts of blood in the stool ? Significant tenderness or worsening of abdominal pains ? Swelling of the abdomen that is new, acute ? Fever of 100?F or higher ? ? ? ?For urgent or emergent issues, a gastroenterologist can be reached at any hour by calling [phone]. ?Do not use MyChart messaging for urgent concerns.  ? ? ?DIET:  We do recommend a small meal at first, but then you may proceed to your regular diet.  Drink plenty of fluids but you should avoid alcoholic beverages for 24 hours. ? ?ACTIVITY:  You should plan to take it easy for the rest of  today and you should NOT DRIVE or use heavy machinery until tomorrow (because of the sedation medicines used during the test).   ? ?FOLLOW UP: ?Our staff will call the number listed on your records 48-72 hours following your procedure to check on you and address any questions or concerns that you may have regarding the information given to you following your procedure. If we do not reach you, we will leave a message.  We will attempt to reach you two times.  During this call, we will ask if you have developed any symptoms of COVID 19. If you develop any symptoms (ie: fever, flu-like symptoms, shortness of breath, cough etc.) before then, please call [phone].  If you test positive for Covid 19 in the 2 weeks post procedure, please call and report this information to [place].   ? ?If any biopsies were taken you will be contacted by phone or by letter within the next 1-3 weeks.  Please call us at [phone] if you have not heard about the biopsies in 3 weeks.  ? ? ?SIGNATURES/CONFIDENTIALITY: ?You and/or your care partner have signed paperwork which will be entered into your electronic medical record.  These signatures attest to the fact that that the information above on your After Visit Summary has been reviewed and is understood.  Full responsibility of the confidentiality of this discharge information lies with you and/or your care-partner.  ?

## 2021-04-11 NOTE — Progress Notes (Signed)
PT taken to PACU. Monitors in place. VSS. Report given to RN. 

## 2021-04-11 NOTE — Progress Notes (Signed)
VS completed by DT.  Pt's states no medical or surgical changes since previsit or office visit.  

## 2021-04-11 NOTE — Progress Notes (Signed)
See 04/04/2021 H&P, no changes.  ?

## 2021-04-11 NOTE — Progress Notes (Signed)
Called to room to assist during endoscopic procedure.  Patient ID and intended procedure confirmed with present staff. Received instructions for my participation in the procedure from the performing physician.  

## 2021-04-13 ENCOUNTER — Telehealth: Payer: Self-pay | Admitting: *Deleted

## 2021-04-13 NOTE — Telephone Encounter (Signed)
?  Follow up Call- ? ? ?  04/11/2021  ?  7:42 AM  ?Call back number  ?Post procedure Call Back phone  # [phone]  ?Permission to leave phone message Yes  ?  ? ?Patient questions: ? ?Do you have a fever, pain , or abdominal swelling? No. ?Pain Score  0 * ? ?Have you tolerated food without any problems? Yes.   ? ?Have you been able to return to your normal activities? Yes.   ? ?Do you have any questions about your discharge instructions: ?Diet   No. ?Medications  No. ?Follow up visit  No. ? ?Do you have questions or concerns about your Care? No. ? ?Actions: ?* If pain score is 4 or above: ?No action needed, pain <4. ? ? ?

## 2021-04-16 ENCOUNTER — Encounter: Payer: Self-pay | Admitting: Gastroenterology

## 2021-05-07 ENCOUNTER — Ambulatory Visit (INDEPENDENT_AMBULATORY_CARE_PROVIDER_SITE_OTHER): Payer: Federal, State, Local not specified - PPO | Admitting: Family Medicine

## 2021-05-07 ENCOUNTER — Encounter (INDEPENDENT_AMBULATORY_CARE_PROVIDER_SITE_OTHER): Payer: Self-pay | Admitting: Family Medicine

## 2021-05-07 VITALS — BP 121/75 | HR 77 | Temp 97.8°F | Ht 63.0 in | Wt 166.0 lb

## 2021-05-07 DIAGNOSIS — Z6829 Body mass index (BMI) 29.0-29.9, adult: Secondary | ICD-10-CM | POA: Diagnosis not present

## 2021-05-07 DIAGNOSIS — E1165 Type 2 diabetes mellitus with hyperglycemia: Secondary | ICD-10-CM | POA: Diagnosis not present

## 2021-05-07 DIAGNOSIS — E559 Vitamin D deficiency, unspecified: Secondary | ICD-10-CM | POA: Diagnosis not present

## 2021-05-07 DIAGNOSIS — E669 Obesity, unspecified: Secondary | ICD-10-CM | POA: Diagnosis not present

## 2021-05-07 DIAGNOSIS — Z7985 Long-term (current) use of injectable non-insulin antidiabetic drugs: Secondary | ICD-10-CM

## 2021-05-17 DIAGNOSIS — E1165 Type 2 diabetes mellitus with hyperglycemia: Secondary | ICD-10-CM | POA: Insufficient documentation

## 2021-05-17 DIAGNOSIS — E559 Vitamin D deficiency, unspecified: Secondary | ICD-10-CM | POA: Insufficient documentation

## 2021-05-17 NOTE — Progress Notes (Signed)
Chief Complaint:   OBESITY Crescent is here to discuss her progress with her obesity treatment plan along with follow-up of her obesity related diagnoses. Tinya is on the Category 2 Plan and states she is following her eating plan approximately 50% of the time. Bannie states she is walking 2 miles, going to the gym and walking the dog 3/4 mile 5-7 times per week.  Today's visit was #: 14 Starting weight: 226 lbs Starting date: 07/18/2020 Today's weight: 166 lbs Today's date: 05/07/2021 Total lbs lost to date: 60 lbs Total lbs lost since last in-office visit: 6 lbs  Interim History: Tiyanna has been following Category 2 meal plan, but not as strictly as before.  She tries to make substitutions as appropriate.  Ereka may be substituting protein cereal for breakfast, leftovers for lunch and dinner.  Shoni is staying as close to the Category plan as possible.  May do a soup (homemade) for supper.  Takita will be traveling on May 20th, 2023 to [place] for work.   Subjective:   1. Type 2 diabetes mellitus with hyperglycemia, without long-term current use of insulin (HCC) Kayah is on Metformin and Ozempic.  She does not have any GI side effects from either medication.   2. Vitamin D deficiency Paden denies any nausea, vomiting, muscle weakness, but complains of fatigue. Harmoney is on OTC Vitamin D.  Assessment/Plan:   1. Type 2 diabetes mellitus with hyperglycemia, without long-term current use of insulin (HCC) Savilla has agreed to continue both medications, and we will repeat labs in June 2023.  2. Vitamin D deficiency Kani has agreed to check labs at next appointment.   3. Obesity with current BMI of 29.5 Clotile is currently in the action stage of change. As such, her goal is to continue with weight loss efforts. She has agreed to the Category 2 Plan.   Exercise goals:  Sherry is to continue current exercise program.  Behavioral modification strategies: increasing lean  protein intake, meal planning and cooking strategies, keeping healthy foods in the home, and planning for success.  Lamanda has agreed to follow-up with our clinic in 6 weeks. She was informed of the importance of frequent follow-up visits to maximize her success with intensive lifestyle modifications for her multiple health conditions.  Objective:   Blood pressure 121/75, pulse 77, temperature 97.8 F (36.6 C), height '5\' 3"'$  (1.6 m), weight 166 lb (75.3 kg), SpO2 98 %. Body mass index is 29.41 kg/m.  General: Cooperative, alert, well developed, in no acute distress. HEENT: Conjunctivae and lids unremarkable. Cardiovascular: Regular rhythm.  Lungs: Normal work of breathing. Neurologic: No focal deficits.   Lab Results  Component Value Date   CREATININE 1.0 12/13/2020   CREATININE 1.0 12/13/2020   BUN 21 12/13/2020   NA 138 12/13/2020   K 3.5 12/13/2020   CL 96 (A) 12/13/2020   CO2 33 (A) 12/13/2020   Lab Results  Component Value Date   ALT 52 (A) 12/13/2020   AST 56 (A) 12/13/2020   ALKPHOS 87 12/13/2020   BILITOT 0.4 07/18/2020   Lab Results  Component Value Date   HGBA1C 5.5 12/13/2020   Lab Results  Component Value Date   INSULIN 15.1 07/18/2020   Lab Results  Component Value Date   TSH 0.99 12/13/2020   Lab Results  Component Value Date   CHOL 72 12/13/2020   HDL 30 (A) 12/13/2020   LDLCALC 19 12/13/2020   TRIG 148 12/13/2020   Lab Results  Component Value Date   VD25OH 77.8 12/13/2020   VD25OH 71.8 07/18/2020   Lab Results  Component Value Date   WBC 7.1 12/13/2020   HGB 14.1 12/13/2020   HCT 43 12/13/2020   HCT 43 12/13/2020   MCV 91 07/18/2020   PLT 241 12/13/2020   No results found for: IRON, TIBC, FERRITIN  Attestation Statements:   Reviewed by clinician on day of visit: allergies, medications, problem list, medical history, surgical history, family history, social history, and previous encounter notes. I, Davy Pique, am acting as  transcriptionist for Coralie Common, MD.  I have reviewed the above documentation for accuracy and completeness, and I agree with the above. - Coralie Common, MD

## 2021-05-22 ENCOUNTER — Encounter: Payer: Self-pay | Admitting: Obstetrics and Gynecology

## 2021-05-22 ENCOUNTER — Telehealth: Payer: Self-pay | Admitting: Obstetrics and Gynecology

## 2021-05-22 ENCOUNTER — Ambulatory Visit (INDEPENDENT_AMBULATORY_CARE_PROVIDER_SITE_OTHER): Payer: Federal, State, Local not specified - PPO | Admitting: Obstetrics and Gynecology

## 2021-05-22 VITALS — BP 112/62 | HR 75 | Ht 62.25 in | Wt 171.0 lb

## 2021-05-22 DIAGNOSIS — Z01419 Encounter for gynecological examination (general) (routine) without abnormal findings: Secondary | ICD-10-CM | POA: Diagnosis not present

## 2021-05-22 DIAGNOSIS — N393 Stress incontinence (female) (male): Secondary | ICD-10-CM

## 2021-05-22 DIAGNOSIS — N952 Postmenopausal atrophic vaginitis: Secondary | ICD-10-CM | POA: Diagnosis not present

## 2021-05-22 MED ORDER — ESTRADIOL 0.1 MG/GM VA CREA: TOPICAL_CREAM | VAGINAL | 2 refills | Status: DC

## 2021-05-22 NOTE — Progress Notes (Signed)
61 y.o. G85P2012 Married Caucasian female here for annual exam.   ? ?Lost 55 - 60 pounds with weight management group.  ?Taking Ozempic. ?Exercising.  ? ?Still wearing a pad due to urinary incontinence.  ?Leaks with sneezing, laughing.  ?She can leak if she waits to void.  ?Has nocturia 1 - 3 times a night.  ?Has constipation.  ? ?PCP: Derinda Late, MD ? ?No LMP recorded. Patient has had a hysterectomy.     ?  ?    ?Sexually active: Yes.    ?The current method of family planning is status post hysterectomy.    ?Exercising: Yes.     Works out at gym 5 days/week, weights ?Smoker:  no ? ?Health Maintenance: ?Pap:  2005 normal ?History of abnormal Pap:  no ?MMG:  02-06-21 Diag.w/Lt.Br.US//Neg/Birads1/screening 42yr?Colonoscopy: 03/2021 polyps removed;5 years ?BMD:  03-01-19 Result : Normal ?TDaP:  PCP ?Gardasil:   n/a ?HIV: neg with PCP ?Hep C:neg with PCP ?Screening Labs:  PCP and weight loss group.  ? ? reports that she has never smoked. She has never been exposed to tobacco smoke. She has never used smokeless tobacco. She reports that she does not currently use alcohol. She reports that she does not use drugs. ? ?Past Medical History:  ?Diagnosis Date  ? Allergy   ? mold.seasonal  ? Anxiety   ? occasional  ? Asthma   ? Back pain   ? Bilateral swelling of feet and ankles   ? Constipation   ? [place]   ? prediabetic per PCP  ? HSV-1 infection   ? Fever blisters.  ? Hyperlipidemia   ? Hypertension   ? Joint pain   ? Kidney problem   ? Other fatigue   ? Prediabetes   ? Shortness of breath on exertion   ? Vitamin D deficiency   ? ? ?Past Surgical History:  ?Procedure Laterality Date  ? ANKLE SURGERY  01/15/1996  ? right  ? COLONOSCOPY    ? TOTAL ABDOMINAL HYSTERECTOMY  01/15/2003  ? still has ovaries  ? WISDOM TOOTH EXTRACTION  01/15/1980  ? ? ?Current Outpatient Medications  ?Medication Sig Dispense Refill  ? acyclovir (ZOVIRAX) 400 MG tablet Take 400 mg by mouth as needed.     ?  ALPRAZolam (XANAX) 0.5 MG tablet Take 0.5 mg by mouth at bedtime as needed. Rarely takes for anxiety    ? cetirizine (ZYRTEC) 10 MG tablet Take 10 mg by mouth daily. allergies    ? cholecalciferol (VITAMIN D3) 25 MCG (1000 UNIT) tablet Take 2,000 Units by mouth daily.    ? CRANBERRY PO Take 1 tablet by mouth at bedtime.    ? ezetimibe (ZETIA) 10 MG tablet Take 10 mg by mouth daily.    ? JUBLIA 10 % SOLN Apply topically daily.    ? metFORMIN (GLUCOPHAGE-XR) 500 MG 24 hr tablet Take 4 tablets by mouth daily.    ? naproxen sodium (ANAPROX) 220 MG tablet Take 440 mg by mouth as needed. Aches and pain    ? rosuvastatin (CRESTOR) 40 MG tablet Take 1 tablet by mouth daily.    ? Semaglutide,0.25 or 0.'5MG'$ /DOS, (OZEMPIC, 0.25 OR 0.5 MG/DOSE,) 2 MG/1.5ML SOPN Inject 0.25 mg into the skin once a week. 2 mL 0  ? ?No current facility-administered medications for this visit.  ? ? ?Family History  ?Problem Relation Age of Onset  ? Anxiety disorder Mother   ? Stroke Mother   ? Glaucoma Mother   ?  loss of vision left eye  ? Anxiety disorder Father   ? Hyperlipidemia Father   ? Hypertension Father   ? Stroke Father   ? Cancer Maternal Grandfather   ? Anxiety disorder Other   ? Colon cancer Neg Hx   ? Esophageal cancer Neg Hx   ? Stomach cancer Neg Hx   ? Breast cancer Neg Hx   ? Colon polyps Neg Hx   ? Rectal cancer Neg Hx   ? ? ?Review of Systems  ?All other systems reviewed and are negative. ? ?Exam:   ?BP 112/62   Pulse 75   Ht 5' 2.25" (1.581 m)   Wt 171 lb (77.6 kg)   SpO2 98%   BMI 31.03 kg/m?     ?General appearance: alert, cooperative and appears stated age ?Head: normocephalic, without obvious abnormality, atraumatic ?Neck: no adenopathy, supple, symmetrical, trachea midline and thyroid normal to inspection and palpation ?Lungs: clear to auscultation bilaterally ?Breasts: normal appearance, no masses or tenderness, No nipple retraction or dimpling, No nipple discharge or bleeding, No axillary adenopathy ?Heart:  regular rate and rhythm ?Abdomen: soft, non-tender; no masses, no organomegaly ?Extremities: extremities normal, atraumatic, no cyanosis or edema ?Skin: skin color, texture, turgor normal. No rashes or lesions ?Lymph nodes: cervical, supraclavicular, and axillary nodes normal. ?Neurologic: grossly normal ? ?Pelvic: External genitalia:  no lesions ?             No abnormal inguinal nodes palpated. ?             Urethra:  normal appearing urethra with no masses, tenderness or lesions ?             Bartholins and Skenes: normal    ?             Vagina: normal appearing vagina with normal color and discharge, no lesions.  Atrophy changes noted and scar tissue palpable at midline of vaginal cuff.  ?             Cervix: no lesions ?             Pap taken: no ?Bimanual Exam:  Uterus:  absent.  ?             Adnexa: no mass, fullness, tenderness ?             Rectal exam: yes.  Confirms. ?             Anus:  normal sphincter tone, no lesions ? ?Chaperone was present for exam:  Estill Bamberg, CMA ? ?Assessment:   ?Well woman visit with gynecologic exam. ?Status post TAH.  Cervix was removed.  Ovaries remain.  ?Vaginal atrophy.  ?GSI.  ?Constipation.  ?Hx HSV 1.  ? ?Plan: ?Mammogram screening discussed. ?Self breast awareness reviewed. ?Pap and HR HPV as above. ?Guidelines for Calcium, Vitamin D, regular exercise program including cardiovascular and weight bearing exercise. ?Referral for pelvic floor therapy.  ?Estrace vaginal cream.  Instructed in use.  I discussed potential effect on breast cancer. ?Follow up annually and prn.  ? ?After visit summary provided.  ? ? ? ?

## 2021-05-22 NOTE — Telephone Encounter (Signed)
Please make a referral for pelvic floor therapy at [facility] for stress incontinence, nocturia, and constipation.  ?

## 2021-05-22 NOTE — Patient Instructions (Signed)

## 2021-05-23 NOTE — Telephone Encounter (Signed)
Referral placed at [facility] physical therapy. They will call patient to schedule. ?

## 2021-05-28 NOTE — Telephone Encounter (Signed)
Patient scheduled on 06/19/21 ?

## 2021-05-28 NOTE — Telephone Encounter (Signed)
Encounter reviewed and closed.  

## 2021-06-18 ENCOUNTER — Encounter (INDEPENDENT_AMBULATORY_CARE_PROVIDER_SITE_OTHER): Payer: Self-pay | Admitting: Family Medicine

## 2021-06-18 ENCOUNTER — Ambulatory Visit (INDEPENDENT_AMBULATORY_CARE_PROVIDER_SITE_OTHER): Payer: Federal, State, Local not specified - PPO | Admitting: Family Medicine

## 2021-06-18 VITALS — BP 130/77 | HR 77 | Temp 97.9°F | Ht 63.0 in | Wt 167.0 lb

## 2021-06-18 DIAGNOSIS — E785 Hyperlipidemia, unspecified: Secondary | ICD-10-CM

## 2021-06-18 DIAGNOSIS — E1169 Type 2 diabetes mellitus with other specified complication: Secondary | ICD-10-CM

## 2021-06-18 DIAGNOSIS — E1165 Type 2 diabetes mellitus with hyperglycemia: Secondary | ICD-10-CM | POA: Diagnosis not present

## 2021-06-18 DIAGNOSIS — E559 Vitamin D deficiency, unspecified: Secondary | ICD-10-CM | POA: Diagnosis not present

## 2021-06-18 DIAGNOSIS — Z6829 Body mass index (BMI) 29.0-29.9, adult: Secondary | ICD-10-CM

## 2021-06-18 DIAGNOSIS — E669 Obesity, unspecified: Secondary | ICD-10-CM

## 2021-06-18 DIAGNOSIS — Z7985 Long-term (current) use of injectable non-insulin antidiabetic drugs: Secondary | ICD-10-CM

## 2021-06-19 ENCOUNTER — Encounter (INDEPENDENT_AMBULATORY_CARE_PROVIDER_SITE_OTHER): Payer: Self-pay | Admitting: Family Medicine

## 2021-06-19 ENCOUNTER — Other Ambulatory Visit (INDEPENDENT_AMBULATORY_CARE_PROVIDER_SITE_OTHER): Payer: Self-pay | Admitting: Family Medicine

## 2021-06-19 ENCOUNTER — Encounter: Payer: Self-pay | Admitting: Physical Therapy

## 2021-06-19 ENCOUNTER — Ambulatory Visit: Payer: Federal, State, Local not specified - PPO | Attending: Obstetrics and Gynecology | Admitting: Physical Therapy

## 2021-06-19 DIAGNOSIS — R279 Unspecified lack of coordination: Secondary | ICD-10-CM | POA: Insufficient documentation

## 2021-06-19 DIAGNOSIS — N393 Stress incontinence (female) (male): Secondary | ICD-10-CM | POA: Insufficient documentation

## 2021-06-19 DIAGNOSIS — M6281 Muscle weakness (generalized): Secondary | ICD-10-CM | POA: Insufficient documentation

## 2021-06-19 DIAGNOSIS — R293 Abnormal posture: Secondary | ICD-10-CM | POA: Insufficient documentation

## 2021-06-19 DIAGNOSIS — E1165 Type 2 diabetes mellitus with hyperglycemia: Secondary | ICD-10-CM

## 2021-06-19 LAB — LIPID PANEL WITH LDL/HDL RATIO
Cholesterol, Total: 96 mg/dL — ABNORMAL LOW (ref 100–199)
HDL: 48 mg/dL (ref >39)
LDL Chol Calc (NIH): 32 mg/dL (ref 0–99)
LDL/HDL Ratio: 0.7 ratio (ref 0.0–3.2)
Triglycerides: 80 mg/dL (ref 0–149)
VLDL Cholesterol Cal: 16 mg/dL (ref 5–40)

## 2021-06-19 LAB — COMPREHENSIVE METABOLIC PANEL
ALT: 42 IU/L — ABNORMAL HIGH (ref 0–32)
AST: 35 IU/L (ref 0–40)
Albumin/Globulin Ratio: 1.6 (ref 1.2–2.2)
Albumin: 4.4 g/dL (ref 3.8–4.8)
Alkaline Phosphatase: 75 IU/L (ref 44–121)
BUN/Creatinine Ratio: 22 (ref 12–28)
BUN: 15 mg/dL (ref 8–27)
Bilirubin Total: 0.5 mg/dL (ref 0.0–1.2)
CO2: 26 mmol/L (ref 20–29)
Calcium: 9.3 mg/dL (ref 8.7–10.3)
Chloride: 101 mmol/L (ref 96–106)
Creatinine, Ser: 0.69 mg/dL (ref 0.57–1.00)
Globulin, Total: 2.7 g/dL (ref 1.5–4.5)
Glucose: 80 mg/dL (ref 70–99)
Potassium: 3.8 mmol/L (ref 3.5–5.2)
Sodium: 140 mmol/L (ref 134–144)
Total Protein: 7.1 g/dL (ref 6.0–8.5)
eGFR: 99 mL/min/{1.73_m2} (ref >59)

## 2021-06-19 LAB — HEMOGLOBIN A1C
Est. average glucose Bld gHb Est-mCnc: 111 mg/dL
Hgb A1c MFr Bld: 5.5 % (ref 4.8–5.6)

## 2021-06-19 LAB — INSULIN, RANDOM: INSULIN: 7.2 u[IU]/mL (ref 2.6–24.9)

## 2021-06-19 LAB — VITAMIN D 25 HYDROXY (VIT D DEFICIENCY, FRACTURES): Vit D, 25-Hydroxy: 72.1 ng/mL (ref 30.0–100.0)

## 2021-06-19 MED ORDER — OZEMPIC (0.25 OR 0.5 MG/DOSE) 2 MG/1.5ML ~~LOC~~ SOPN: 0.2500 mg | PEN_INJECTOR | SUBCUTANEOUS | 0 refills | Status: DC

## 2021-06-19 NOTE — Telephone Encounter (Signed)
Pt calling to check status of refill she asked for yesterday. Pt concerned since Dr. Jeani Sow is out of the office today and wanted to make sure it would get refilled for her. The best pharmacy is [place], [address]  Pt had appt 06/18/21 with Dr. Jeani Sow.

## 2021-06-19 NOTE — Telephone Encounter (Signed)
Dr.Ukleja 

## 2021-06-19 NOTE — Therapy (Addendum)
OUTPATIENT PHYSICAL THERAPY FEMALE PELVIC EVALUATION   Patient Name: Debbie Castro MRN: [id number] DOB:1960/07/15, 61 y.o., female Today's Date: 06/19/2021   PT End of Session - 06/19/21 1604     Visit Number 1    Date for PT Re-Evaluation 09/19/21    Authorization Type BCBS    PT Start Time 1526    PT Stop Time 1605    PT Time Calculation (min) 39 min    Activity Tolerance Patient tolerated treatment well    Behavior During Therapy WFL for tasks assessed/performed             Past Medical History:  Diagnosis Date   Allergy    mold.seasonal   Anxiety    occasional   Asthma    Back pain    Bilateral swelling of feet and ankles    Constipation    Diabetes mellitus without complication (HCC)    prediabetic per PCP   HSV-1 infection    Fever blisters.   Hyperlipidemia    Hypertension    Joint pain    Kidney problem    Other fatigue    Prediabetes    Shortness of breath on exertion    Vitamin D deficiency    Past Surgical History:  Procedure Laterality Date   ANKLE SURGERY  01/15/1996   right   COLONOSCOPY     TOTAL ABDOMINAL HYSTERECTOMY  01/15/2003   still has ovaries   WISDOM TOOTH EXTRACTION  01/15/1980   Patient Active Problem List   Diagnosis Date Noted   Type 2 diabetes mellitus with hyperglycemia, without long-term current use of insulin ([place]) 05/17/2021   Vitamin D deficiency 05/17/2021   Trochanteric bursitis of left hip 04/02/2018    PCP: Derinda Late, MD  REFERRING PROVIDER: Nunzio Cobbs, MD  REFERRING DIAG: N39.3 (ICD-10-CM) - Stress incontinence  THERAPY DIAG:  Muscle weakness (generalized) - Plan: PT plan of care cert/re-cert  Unspecified lack of coordination - Plan: PT plan of care cert/re-cert  Abnormal posture - Plan: PT plan of care cert/re-cert  Rationale for Evaluation and Treatment Rehabilitation  ONSET DATE: several years  SUBJECTIVE:                                                                                                                                                                                            SUBJECTIVE STATEMENT: Pt reports she does have urinary leakage, this has improved since losing 60 lbs. Pt has leakage with stressors and urge daily, wears one pad per day and night. Gets up 1-2x per night.  Fluid intake: Yes: 60oz of vitamin water per  day, one cup of coffee    Patient confirms identification and approves PT to assess pelvic floor and treatment Yes   PAIN:  Are you having pain? No   PRECAUTIONS: None  WEIGHT BEARING RESTRICTIONS No  FALLS:  Has patient fallen in last 6 months? No  LIVING ENVIRONMENT: Lives with: lives with their family Lives in: House/apartment   OCCUPATION: remote - [employer] a lot of sitting  PLOF: Independent  PATIENT GOALS to have less leakage  PERTINENT HISTORY:  stress incontinence, nocturia, and constipation.  hysterectomy, still has ovaries, HSV 1,  Sexual abuse: No  BOWEL MOVEMENT Pain with bowel movement: No Type of bowel movement:Type (Bristol Stool Scale) 4, Frequency daily , and Strain No Fully empty rectum: Yes:   Leakage: No Pads: No Fiber supplement: Yes:    URINATION Pain with urination: No Fully empty bladder: No Stream: Strong Urgency: Yes: sometimes "when I think about it" Frequency: every 2 hours Leakage: Urge to void, Walking to the bathroom, Coughing, Sneezing, and Laughing Pads: Yes: 1 pad per day and night  INTERCOURSE Pain with intercourse: Initial Penetration does report dryness Ability to have vaginal penetration:  Yes:   Climax: not painful Marinoff Scale: 0/3  PREGNANCY Vaginal deliveries 2 Tearing Yes: with first; no tearing with second  C-section deliveries 0 Currently pregnant No  PROLAPSE None    OBJECTIVE:   DIAGNOSTIC FINDINGS:    COGNITION:  Overall cognitive status: Within functional limits for tasks assessed     SENSATION:  Light touch: Appears  intact  Proprioception: Appears intact  MUSCLE LENGTH: Hamstrings and adductors limited by 25%  POSTURE:  Rounded shoulders, mild posterior pelvic tilt  LUMBARAROM/PROM  WFL but did report stiffness in all directions  LOWER EXTREMITY ROM:  WFL bil  LOWER EXTREMITY MMT:   Rt hip 4/5 grossly; Lt hip abduction 3/5 all others 4/5; bil knees 5/5, ankles 5/5  PELVIC MMT:   MMT eval  Vaginal 3/5; 6s holds; 5 reps  Internal Anal Sphincter   External Anal Sphincter   Puborectalis   Diastasis Recti   (Blank rows = not tested)        PALPATION:   General  no TTP                External Perineal Exam no TTP, WFL with mild dryness noted                             Internal Pelvic Floor no TTP  TONE: WFL  PROLAPSE: Not seen in hooklying with strong cough  TODAY'S TREATMENT  06/19/2021 EVAL Examination completed, findings reviewed, pt educated on POC, HEP. Pt motivated to participate in PT and agreeable to attempt recommendations.     PATIENT EDUCATION:  Education details: [email or web address] Person educated: Patient Education method: Explanation, Demonstration, Tactile cues, Verbal cues, and Handouts Education comprehension: verbalized understanding and returned demonstration   HOME EXERCISE PROGRAM: [id number]  ASSESSMENT:  CLINICAL IMPRESSION: Patient is a 61 y.o. female  who was seen today for physical therapy evaluation and treatment for urinary incontinence. Pt has had leakage for several years, has improved a bit since losing weight over the past year but still present. Pt has leakage with stressors of sneezing/laughing/coughing and sometimes with urge to go to restroom and can't make quickly enough. Pt wears at least one pad per day and once at night, does wake from sleep to urinate at least 1x per night. Pt found  to have decreased flexibility in spine and bil hips, decreased hip strength and decreased core strength. Pt reports she has been working out more recently and working  on this. Pt consented to internal pelvic floor assessment and found to have decreased endurance, and coordination and strength. Pt would benefit from additional PT to further address deficits.     OBJECTIVE IMPAIRMENTS decreased coordination, decreased endurance, decreased strength, impaired flexibility, improper body mechanics, and postural dysfunction.   ACTIVITY LIMITATIONS continence  PARTICIPATION LIMITATIONS: community activity  PERSONAL FACTORS Time since onset of injury/illness/exacerbation and 1 comorbidity: 2 vaginal births one with tearing, post menopausal   are also affecting patient's functional outcome.   REHAB POTENTIAL: Good  CLINICAL DECISION MAKING: Stable/uncomplicated  EVALUATION COMPLEXITY: Low   GOALS: Goals reviewed with patient? Yes  SHORT TERM GOALS: Target date: 07/17/2021  Pt to be I with HEP.  Baseline: Goal status: INITIAL  2.  Pt will have 25% less urgency due to bladder retraining and strengthening  Baseline:  Goal status: INITIAL  3.  Pt to demonstrate at least 3/5 pelvic floor strength and ability to hold for 8s without compensatory strategies for improved pelvic stability and decreased strain at pelvic floor/ decrease leakage.  Baseline:  Goal status: INITIAL   LONG TERM GOALS: Target date: 09/19/2021   Pt to be I with advanced HEP.  Baseline:  Goal status: INITIAL  2.  Pt will have 50% less urgency due to bladder retraining and strengthening  Baseline:  Goal status: INITIAL  3.  Pt to demonstrate at least 4/5 pelvic floor strength for improved pelvic stability and decreased strain at pelvic floor/ decrease leakage.  Baseline:  Goal status: INITIAL  4.  Pt to demonstrate at least 5/5 bil hip strength for improved pelvic stability and functional squats without leakage.  Baseline:  Goal status: INITIAL  5.  Pt to demonstrate improved coordination of pelvic floor and breathing mechanics for strengthening with lifting 15# without leakage  for improved QOL.  Baseline:  Goal status: INITIAL  PLAN: PT FREQUENCY: 1x/week  PT DURATION:  8 sessions  PLANNED INTERVENTIONS: Therapeutic exercises, Therapeutic activity, Neuromuscular re-education, Patient/Family education, Joint mobilization, Dry Needling, Spinal mobilization, Cryotherapy, Moist heat, Manual lymph drainage, scar mobilization, Taping, Biofeedback, and Manual therapy  PLAN FOR NEXT SESSION: urge drill if needed, review knack, coordination of pelvic floor and breathing mechanics with strengthening exercises   Stacy Gardner, PT, DPT 06/06/234:13 PM

## 2021-06-21 NOTE — Progress Notes (Unsigned)
Chief Complaint:   OBESITY Debbie Castro is here to discuss her progress with her obesity treatment plan along with follow-up of her obesity related diagnoses. Debbie Castro is on the Category 2 Plan and states she is following her eating plan approximately 30-40% of the time. Debbie Castro states she is exercising 60 minutes 5-7 times per week.  Today's visit was #: 15 Starting weight: 226 lbs Starting date: 07/18/2020 Today's weight: 167 lbs Today's date: 06/18/2021 Total lbs lost to date: 59 lbs Total lbs lost since last in-office visit: 1  Interim History: [place] traveled for a week to [place] and she was not as on the plan as she would have liked to be. She has a work trip to [employer] scheduled coming up; After this trip she does not have any other upcoming trips scheduled.   Subjective:   1. Type 2 diabetes mellitus with hyperglycemia, without long-term current use of insulin (HCC) Debbie Castro is doing well on medications without any GI side effects noted.  2. Hyperlipidemia associated with type 2 diabetes mellitus (Debbie Castro) Debbie Castro is currently taking Crestor and Zetia. Denies any side effects.  3. Vitamin D deficiency Debbie Castro is not currently taking prescription Vit D.  Assessment/Plan:   1. Type 2 diabetes mellitus with hyperglycemia, without long-term current use of insulin (HCC) We will obtain A1c, Insulin level and CMP today.  - Comprehensive metabolic panel - Hemoglobin A1c - Insulin, random We will refill Ozempic 0.25 mg subcutaneous once a week for 1 month with 0 refills.  -Refill Semaglutide,0.25 or 0.'5MG'$ /DOS, (OZEMPIC, 0.25 OR 0.5 MG/DOSE,) 2 MG/1.5ML SOPN; Inject 0.25 mg into the skin once a week.  Dispense: 2 mL; Refill: 0  2. Hyperlipidemia associated with type 2 diabetes mellitus (Debbie Castro) We will obtain FLP labs today.  - Lipid Panel With LDL/HDL Ratio  3. Vitamin D deficiency We will obtain Vit D level lab today.    - VITAMIN D 25 Hydroxy (Vit-D Deficiency, Fractures)  4. Obesity  with current BMI of 29.7 Debbie Castro is currently in the action stage of change. As such, her goal is to continue with weight loss efforts. She has agreed to the Category 2 Plan and practicing portion control and making smarter food choices, such as increasing vegetables and decreasing simple carbohydrates.   Exercise goals: All adults should avoid inactivity. Some physical activity is better than none, and adults who participate in any amount of physical activity gain some health benefits.  Behavioral modification strategies: increasing lean protein intake, meal planning and cooking strategies, and travel eating strategies.  Debbie Castro has agreed to follow-up with our clinic in 5 weeks. She was informed of the importance of frequent follow-up visits to maximize her success with intensive lifestyle modifications for her multiple health conditions.   Debbie Castro was informed we would discuss her lab results at her next visit unless there is a critical issue that needs to be addressed sooner. Debbie Castro agreed to keep her next visit at the agreed upon time to discuss these results.  Objective:   Blood pressure 130/77, pulse 77, temperature 97.9 F (36.6 C), height '5\' 3"'$  (1.6 m), weight 167 lb (75.8 kg), SpO2 97 %. Body mass index is 29.58 kg/m.  General: Cooperative, alert, well developed, in no acute distress. HEENT: Conjunctivae and lids unremarkable. Cardiovascular: Regular rhythm.  Lungs: Normal work of breathing. Neurologic: No focal deficits.   Lab Results  Component Value Date   CREATININE 0.69 06/18/2021   BUN 15 06/18/2021   NA 140 06/18/2021  K 3.8 06/18/2021   CL 101 06/18/2021   CO2 26 06/18/2021   Lab Results  Component Value Date   ALT 42 (H) 06/18/2021   AST 35 06/18/2021   ALKPHOS 75 06/18/2021   BILITOT 0.5 06/18/2021   Lab Results  Component Value Date   HGBA1C 5.5 06/18/2021   HGBA1C 5.5 12/13/2020   Lab Results  Component Value Date   INSULIN 7.2 06/18/2021   INSULIN  15.1 07/18/2020   Lab Results  Component Value Date   TSH 0.99 12/13/2020   Lab Results  Component Value Date   CHOL 96 (L) 06/18/2021   HDL 48 06/18/2021   LDLCALC 32 06/18/2021   TRIG 80 06/18/2021   Lab Results  Component Value Date   VD25OH 72.1 06/18/2021   VD25OH 77.8 12/13/2020   VD25OH 71.8 07/18/2020   Lab Results  Component Value Date   WBC 7.1 12/13/2020   HGB 14.1 12/13/2020   HCT 43 12/13/2020   HCT 43 12/13/2020   MCV 91 07/18/2020   PLT 241 12/13/2020   No results found for: "IRON", "TIBC", "FERRITIN"  Attestation Statements:   Reviewed by clinician on day of visit: allergies, medications, problem list, medical history, surgical history, family history, social history, and previous encounter notes.  I, Elnora Morrison, RMA am acting as transcriptionist for Coralie Common, MD.  I have reviewed the above documentation for accuracy and completeness, and I agree with the above. -  ***

## 2021-07-05 ENCOUNTER — Ambulatory Visit: Payer: Federal, State, Local not specified - PPO | Admitting: Physical Therapy

## 2021-07-18 ENCOUNTER — Ambulatory Visit: Payer: Federal, State, Local not specified - PPO | Attending: Obstetrics and Gynecology | Admitting: Physical Therapy

## 2021-07-18 DIAGNOSIS — R279 Unspecified lack of coordination: Secondary | ICD-10-CM | POA: Diagnosis not present

## 2021-07-18 DIAGNOSIS — R293 Abnormal posture: Secondary | ICD-10-CM | POA: Diagnosis not present

## 2021-07-18 DIAGNOSIS — M6281 Muscle weakness (generalized): Secondary | ICD-10-CM | POA: Diagnosis not present

## 2021-07-18 NOTE — Patient Instructions (Signed)

## 2021-07-18 NOTE — Therapy (Signed)
OUTPATIENT PHYSICAL THERAPY FEMALE PELVIC TREATMENT   Patient Name: Debbie Castro MRN: [id number] DOB:[birth year], 61 y.o., female Today's Date: 07/18/2021   PT End of Session - 07/18/21 1445     Visit Number 2    Date for PT Re-Evaluation 09/19/21    Authorization Type BCBS    PT Start Time 1445    PT Stop Time 1523    PT Time Calculation (min) 38 min    Activity Tolerance Patient tolerated treatment well    Behavior During Therapy WFL for tasks assessed/performed             Past Medical History:  Diagnosis Date   Allergy    mold.seasonal   Anxiety    occasional   Asthma    Back pain    Bilateral swelling of feet and ankles    Constipation    Diabetes mellitus without complication (HCC)    prediabetic per PCP   HSV-1 infection    Fever blisters.   Hyperlipidemia    Hypertension    Joint pain    Kidney problem    Other fatigue    Prediabetes    Shortness of breath on exertion    Vitamin D deficiency    Past Surgical History:  Procedure Laterality Date   ANKLE SURGERY  01/15/1996   right   COLONOSCOPY     TOTAL ABDOMINAL HYSTERECTOMY  01/15/2003   still has ovaries   WISDOM TOOTH EXTRACTION  01/15/1980   Patient Active Problem List   Diagnosis Date Noted   Type 2 diabetes mellitus with hyperglycemia, without long-term current use of insulin ([facility]) 05/17/2021   Vitamin D deficiency 05/17/2021   Trochanteric bursitis of left hip 04/02/2018    PCP: Derinda Late, MD  REFERRING PROVIDER: Nunzio Cobbs, MD  REFERRING DIAG: N39.3 (ICD-10-CM) - Stress incontinence  THERAPY DIAG:  Muscle weakness (generalized)  Abnormal posture  Unspecified lack of coordination  Rationale for Evaluation and Treatment Rehabilitation  ONSET DATE: several years  SUBJECTIVE:                                                                                                                                                                                            SUBJECTIVE STATEMENT: Pt reports she hasn't been able to do HEP yet.   Fluid intake: Yes: 60oz of vitamin water per day, one cup of coffee    Patient confirms identification and approves PT to assess pelvic floor and treatment Yes   PAIN:  Are you having pain? No   PRECAUTIONS: None  PLOF: Independent  PATIENT GOALS to have less leakage  PERTINENT HISTORY:  stress incontinence, nocturia, and constipation.  hysterectomy, still has ovaries, HSV 1,  Sexual abuse: No  BOWEL MOVEMENT Pain with bowel movement: No Type of bowel movement:Type (Bristol Stool Scale) 4, Frequency daily , and Strain No Fully empty rectum: Yes:   Leakage: No Pads: No Fiber supplement: Yes:    URINATION Pain with urination: No Fully empty bladder: No Stream: Strong Urgency: Yes: sometimes "when I think about it" Frequency: every 2 hours Leakage: Urge to void, Walking to the bathroom, Coughing, Sneezing, and Laughing Pads: Yes: 1 pad per day and night  INTERCOURSE Pain with intercourse: Initial Penetration does report dryness Ability to have vaginal penetration:  Yes:   Climax: not painful Marinoff Scale: 0/3  PREGNANCY Vaginal deliveries 2 Tearing Yes: with first; no tearing with second  C-section deliveries 0 Currently pregnant No  PROLAPSE None    OBJECTIVE:   DIAGNOSTIC FINDINGS:    COGNITION:  Overall cognitive status: Within functional limits for tasks assessed     SENSATION:  Light touch: Appears intact  Proprioception: Appears intact  MUSCLE LENGTH: Hamstrings and adductors limited by 25%  POSTURE:  Rounded shoulders, mild posterior pelvic tilt  LUMBARAROM/PROM  WFL but did report stiffness in all directions  LOWER EXTREMITY ROM:  WFL bil  LOWER EXTREMITY MMT:   Rt hip 4/5 grossly; Lt hip abduction 3/5 all others 4/5; bil knees 5/5, ankles 5/5  PELVIC MMT:   MMT eval  Vaginal 3/5; 6s holds; 5 reps  Internal Anal Sphincter   External Anal  Sphincter   Puborectalis   Diastasis Recti   (Blank rows = not tested)        PALPATION:   General  no TTP                External Perineal Exam no TTP, WFL with mild dryness noted                             Internal Pelvic Floor no TTP  TONE: WFL  PROLAPSE: Not seen in hooklying with strong cough  TODAY'S TREATMENT   07/18/21: Pt educated on urge drill and HEP in detail, pt had several questions about these and all answered.    06/19/2021 EVAL Examination completed, findings reviewed, pt educated on POC, HEP. Pt motivated to participate in PT and agreeable to attempt recommendations.     PATIENT EDUCATION:  Education details: [id number] Person educated: Patient Education method: Explanation, Demonstration, Tactile cues, Verbal cues, and Handouts Education comprehension: verbalized understanding and returned demonstration   HOME EXERCISE PROGRAM: [id number]  ASSESSMENT:  CLINICAL IMPRESSION: Patient reports she has been unable to do HEP so far but wants to review this and start it more now. Pt session focused on this education of HEP and urge drill. Pt had several questions about these and all reviewed. Pt denied questions at end of session. Pt would benefit from additional PT to further address deficits.     OBJECTIVE IMPAIRMENTS decreased coordination, decreased endurance, decreased strength, impaired flexibility, improper body mechanics, and postural dysfunction.   ACTIVITY LIMITATIONS continence  PARTICIPATION LIMITATIONS: community activity  PERSONAL FACTORS Time since onset of injury/illness/exacerbation and 1 comorbidity: 2 vaginal births one with tearing, post menopausal   are also affecting patient's functional outcome.   REHAB POTENTIAL: Good  CLINICAL DECISION MAKING: Stable/uncomplicated  EVALUATION COMPLEXITY: Low   GOALS: Goals reviewed with patient? Yes  SHORT TERM GOALS: Target date: 07/17/2021  Pt  to be I with HEP.  Baseline: Goal status:  INITIAL  2.  Pt will have 25% less urgency due to bladder retraining and strengthening  Baseline:  Goal status: INITIAL  3.  Pt to demonstrate at least 3/5 pelvic floor strength and ability to hold for 8s without compensatory strategies for improved pelvic stability and decreased strain at pelvic floor/ decrease leakage.  Baseline:  Goal status: INITIAL   LONG TERM GOALS: Target date: 09/19/2021   Pt to be I with advanced HEP.  Baseline:  Goal status: INITIAL  2.  Pt will have 50% less urgency due to bladder retraining and strengthening  Baseline:  Goal status: INITIAL  3.  Pt to demonstrate at least 4/5 pelvic floor strength for improved pelvic stability and decreased strain at pelvic floor/ decrease leakage.  Baseline:  Goal status: INITIAL  4.  Pt to demonstrate at least 5/5 bil hip strength for improved pelvic stability and functional squats without leakage.  Baseline:  Goal status: INITIAL  5.  Pt to demonstrate improved coordination of pelvic floor and breathing mechanics for strengthening with lifting 15# without leakage for improved QOL.  Baseline:  Goal status: INITIAL  PLAN: PT FREQUENCY: 1x/week  PT DURATION:  8 sessions  PLANNED INTERVENTIONS: Therapeutic exercises, Therapeutic activity, Neuromuscular re-education, Patient/Family education, Joint mobilization, Dry Needling, Spinal mobilization, Cryotherapy, Moist heat, Manual lymph drainage, scar mobilization, Taping, Biofeedback, and Manual therapy  PLAN FOR NEXT SESSION:review knack, coordination of pelvic floor and breathing mechanics with strengthening exercises   Stacy Gardner, PT, DPT 07/05/233:23 PM

## 2021-07-23 ENCOUNTER — Ambulatory Visit (INDEPENDENT_AMBULATORY_CARE_PROVIDER_SITE_OTHER): Payer: Federal, State, Local not specified - PPO | Admitting: Family Medicine

## 2021-07-23 ENCOUNTER — Encounter (INDEPENDENT_AMBULATORY_CARE_PROVIDER_SITE_OTHER): Payer: Self-pay | Admitting: Family Medicine

## 2021-07-23 VITALS — BP 147/75 | HR 69 | Temp 98.1°F | Ht 63.0 in | Wt 167.0 lb

## 2021-07-23 DIAGNOSIS — E1165 Type 2 diabetes mellitus with hyperglycemia: Secondary | ICD-10-CM

## 2021-07-23 DIAGNOSIS — E1169 Type 2 diabetes mellitus with other specified complication: Secondary | ICD-10-CM

## 2021-07-23 DIAGNOSIS — Z7985 Long-term (current) use of injectable non-insulin antidiabetic drugs: Secondary | ICD-10-CM

## 2021-07-23 DIAGNOSIS — Z6829 Body mass index (BMI) 29.0-29.9, adult: Secondary | ICD-10-CM

## 2021-07-23 DIAGNOSIS — E669 Obesity, unspecified: Secondary | ICD-10-CM | POA: Diagnosis not present

## 2021-07-23 DIAGNOSIS — E785 Hyperlipidemia, unspecified: Secondary | ICD-10-CM

## 2021-07-23 DIAGNOSIS — Z7984 Long term (current) use of oral hypoglycemic drugs: Secondary | ICD-10-CM

## 2021-07-23 MED ORDER — OZEMPIC (0.25 OR 0.5 MG/DOSE) 2 MG/1.5ML ~~LOC~~ SOPN: 0.2500 mg | PEN_INJECTOR | SUBCUTANEOUS | 0 refills | Status: DC

## 2021-07-23 MED ORDER — ROSUVASTATIN CALCIUM 40 MG PO TABS: 20.0000 mg | ORAL_TABLET | Freq: Every day | ORAL | Status: AC

## 2021-07-23 MED ORDER — BD PEN NEEDLE NANO 2ND GEN 32G X 4 MM MISC: 1.0000 | Freq: Two times a day (BID) | 0 refills | Status: AC

## 2021-07-25 NOTE — Progress Notes (Signed)
Chief Complaint:   OBESITY Debbie Castro is here to discuss her progress with her obesity treatment plan along with follow-up of her obesity related diagnoses. Debbie Castro is on the Category 2 Plan and practicing portion control and making smarter food choices, such as increasing vegetables and decreasing simple carbohydrates and states she is following her eating plan approximately 50% of the time. Debbie Castro states she is walking and going to the gym 60 minutes 5-7 times per week.  Today's visit was #: 100 Starting weight: 226 lbs Starting date: 07/18/2020 Today's weight: 167 lbs Today's date: 07/23/2021 Total lbs lost to date: 59 lbs Total lbs lost since last in-office visit: 0  Interim History: Debbie Castro was traveling for work in was in [place] for a period of time. She is not traveling for the next month and does not anticipate any travel for work for rest of the year. She wants to stay on Cat 2 and then make mindful controlled decisions when eating out. She is starting to renovate a house for her parents.  Subjective:   1. Type 2 diabetes mellitus with hyperglycemia, without long-term current use of insulin (HCC) Debbie Castro is currently taking Metformin and Ozempic. On Ozempic 0.25 mg weekly.  2. Hyperlipidemia associated with type 2 diabetes mellitus (Debbie Castro) Debbie Castro is currently taking Zetia and Crestor. Noticing some increase in myalgias.   Assessment/Plan:   1. Type 2 diabetes mellitus with hyperglycemia, without long-term current use of insulin (HCC) We will refill Ozempic 0.25 mg SubQ once weekly and Insulin needles for 1 month with 0 refills.  -Refill Insulin Pen Needle (BD PEN NEEDLE NANO 2ND GEN) 32G X 4 MM MISC; 1 Package by Does not apply route 2 (two) times daily.  Dispense: 100 each; Refill: 0  -Refill Semaglutide,0.25 or 0.'5MG'$ /DOS, (OZEMPIC, 0.25 OR 0.5 MG/DOSE,) 2 MG/1.5ML SOPN; Inject 0.25 mg into the skin once a week.  Dispense: 2 mL; Refill: 0  2. Hyperlipidemia associated with  type 2 diabetes mellitus (HCC) We will DECREASE/refill to 1/2 tab of Crestor 40 mg by mouth daily for 1 month with 0 refills. Will follow up with labs in 3-4 months. If myalgias continues, may need to chang to statin.  -Decrease/Refill rosuvastatin (CRESTOR) 40 MG tablet; Take 0.5 tablets (20 mg total) by mouth daily.  3. Obesity with current BMI of 29.7 Debbie Castro is currently in the action stage of change. As such, her goal is to continue with weight loss efforts. She has agreed to the Category 2 Plan.   Exercise goals: All adults should avoid inactivity. Some physical activity is better than none, and adults who participate in any amount of physical activity gain some health benefits.  Behavioral modification strategies: increasing lean protein intake, meal planning and cooking strategies, keeping healthy foods in the home, and planning for success.  Debbie Castro has agreed to follow-up with our clinic in 5 weeks. She was informed of the importance of frequent follow-up visits to maximize her success with intensive lifestyle modifications for her multiple health conditions.   Objective:   Blood pressure (!) 147/75, pulse 69, temperature 98.1 F (36.7 C), height '5\' 3"'$  (1.6 m), weight 167 lb (75.8 kg), SpO2 99 %. Body mass index is 29.58 kg/m.  General: Cooperative, alert, well developed, in no acute distress. HEENT: Conjunctivae and lids unremarkable. Cardiovascular: Regular rhythm.  Lungs: Normal work of breathing. Neurologic: No focal deficits.   Lab Results  Component Value Date   CREATININE 0.69 06/18/2021   BUN 15 06/18/2021  NA 140 06/18/2021   K 3.8 06/18/2021   CL 101 06/18/2021   CO2 26 06/18/2021   Lab Results  Component Value Date   ALT 42 (H) 06/18/2021   AST 35 06/18/2021   ALKPHOS 75 06/18/2021   BILITOT 0.5 06/18/2021   Lab Results  Component Value Date   HGBA1C 5.5 06/18/2021   HGBA1C 5.5 12/13/2020   Lab Results  Component Value Date   INSULIN 7.2  06/18/2021   INSULIN 15.1 07/18/2020   Lab Results  Component Value Date   TSH 0.99 12/13/2020   Lab Results  Component Value Date   CHOL 96 (L) 06/18/2021   HDL 48 06/18/2021   LDLCALC 32 06/18/2021   TRIG 80 06/18/2021   Lab Results  Component Value Date   VD25OH 72.1 06/18/2021   VD25OH 77.8 12/13/2020   VD25OH 71.8 07/18/2020   Lab Results  Component Value Date   WBC 7.1 12/13/2020   HGB 14.1 12/13/2020   HCT 43 12/13/2020   HCT 43 12/13/2020   MCV 91 07/18/2020   PLT 241 12/13/2020   No results found for: "IRON", "TIBC", "FERRITIN"  Attestation Statements:   Reviewed by clinician on day of visit: allergies, medications, problem list, medical history, surgical history, family history, social history, and previous encounter notes.  I, Elnora Morrison, RMA am acting as transcriptionist for Coralie Common, MD. I have reviewed the above documentation for accuracy and completeness, and I agree with the above. - Coralie Common, MD

## 2021-07-27 ENCOUNTER — Ambulatory Visit: Payer: Federal, State, Local not specified - PPO | Admitting: Physical Therapy

## 2021-07-27 DIAGNOSIS — M6281 Muscle weakness (generalized): Secondary | ICD-10-CM

## 2021-07-27 DIAGNOSIS — R293 Abnormal posture: Secondary | ICD-10-CM | POA: Diagnosis not present

## 2021-07-27 DIAGNOSIS — R279 Unspecified lack of coordination: Secondary | ICD-10-CM

## 2021-07-27 NOTE — Therapy (Signed)
OUTPATIENT PHYSICAL THERAPY FEMALE PELVIC TREATMENT   Patient Name: Debbie Castro MRN: [id number] DOB:01-Jan-1961, 61 y.o., female Today's Date: 07/27/2021   PT End of Session - 07/27/21 0930     Visit Number 3    Date for PT Re-Evaluation 09/19/21    Authorization Type BCBS    PT Start Time 0930    PT Stop Time 1011    PT Time Calculation (min) 41 min    Activity Tolerance Patient tolerated treatment well    Behavior During Therapy [facility] for tasks assessed/performed             Past Medical History:  Diagnosis Date   Allergy    mold.seasonal   Anxiety    occasional   Asthma    Back pain    Bilateral swelling of feet and ankles    Constipation    Diabetes mellitus without complication (HCC)    prediabetic per PCP   HSV-1 infection    Fever blisters.   Hyperlipidemia    Hypertension    Joint pain    Kidney problem    Other fatigue    Prediabetes    Shortness of breath on exertion    Vitamin D deficiency    Past Surgical History:  Procedure Laterality Date   ANKLE SURGERY  01/15/1996   right   COLONOSCOPY     TOTAL ABDOMINAL HYSTERECTOMY  01/15/2003   still has ovaries   WISDOM TOOTH EXTRACTION  01/15/1980   Patient Active Problem List   Diagnosis Date Noted   Type 2 diabetes mellitus with hyperglycemia, without long-term current use of insulin ([place]) 05/17/2021   Vitamin D deficiency 05/17/2021   Trochanteric bursitis of left hip 04/02/2018    PCP: Derinda Late, MD  REFERRING PROVIDER: Nunzio Cobbs, MD  REFERRING DIAG: N39.3 (ICD-10-CM) - Stress incontinence  THERAPY DIAG:  Unspecified lack of coordination  Muscle weakness (generalized)  Abnormal posture  Rationale for Evaluation and Treatment Rehabilitation  ONSET DATE: several years  SUBJECTIVE:                                                                                                                                                                                            SUBJECTIVE STATEMENT: Pt reports she has noticed a difference with leakage with starting a a regular workout plan, is doing Kegels daily. Pt reports she is unsure what is causing her leakage but notices a dampness at pad, not for sure if it urine or discharge, will be more aware this this week.   Fluid intake: Yes: 60oz of vitamin water per day, one cup  of coffee    Patient confirms identification and approves PT to assess pelvic floor and treatment Yes   PAIN:  Are you having pain? No   PRECAUTIONS: None  PLOF: Independent  PATIENT GOALS to have less leakage  PERTINENT HISTORY:  stress incontinence, nocturia, and constipation.  hysterectomy, still has ovaries, HSV 1,  Sexual abuse: No  BOWEL MOVEMENT Pain with bowel movement: No Type of bowel movement:Type (Bristol Stool Scale) 4, Frequency daily , and Strain No Fully empty rectum: Yes:   Leakage: No Pads: No Fiber supplement: Yes:    URINATION Pain with urination: No Fully empty bladder: No Stream: Strong Urgency: Yes: sometimes "when I think about it" Frequency: every 2 hours Leakage: Urge to void, Walking to the bathroom, Coughing, Sneezing, and Laughing Pads: Yes: 1 pad per day and night  INTERCOURSE Pain with intercourse: Initial Penetration does report dryness Ability to have vaginal penetration:  Yes:   Climax: not painful Marinoff Scale: 0/3  PREGNANCY Vaginal deliveries 2 Tearing Yes: with first; no tearing with second  C-section deliveries 0 Currently pregnant No  PROLAPSE None    OBJECTIVE:   DIAGNOSTIC FINDINGS:    COGNITION:  Overall cognitive status: Within functional limits for tasks assessed     SENSATION:  Light touch: Appears intact  Proprioception: Appears intact  MUSCLE LENGTH: Hamstrings and adductors limited by 25%  POSTURE:  Rounded shoulders, mild posterior pelvic tilt  LUMBARAROM/PROM  WFL but did report stiffness in all directions  LOWER EXTREMITY  ROM:  WFL bil  LOWER EXTREMITY MMT:   Rt hip 4/5 grossly; Lt hip abduction 3/5 all others 4/5; bil knees 5/5, ankles 5/5  PELVIC MMT:   MMT eval  Vaginal 3/5; 6s holds; 5 reps  Internal Anal Sphincter   External Anal Sphincter   Puborectalis   Diastasis Recti   (Blank rows = not tested)        PALPATION:   General  no TTP                External Perineal Exam no TTP, WFL with mild dryness noted                             Internal Pelvic Floor no TTP  TONE: WFL  PROLAPSE: Not seen in hooklying with strong cough  TODAY'S TREATMENT   07/27/21: 2x10 10# Sit to standm from standard chair 4# mario punches x10 Green band palloffs 2x10 each Bridges with ball squeezes 2x10 Bosu: on leg on one leg on ground for squats x10 each Bosu: forward step up and weight shift 2x10 each    PATIENT EDUCATION:  Education details: [id number] Person educated: Patient Education method: Consulting civil engineer, Demonstration, Tactile cues, Verbal cues, and Handouts Education comprehension: verbalized understanding and returned demonstration   HOME EXERCISE PROGRAM: [email or web address]  ASSESSMENT:  CLINICAL IMPRESSION: Patient session focused on hip and core strengthening with coordination of pelvic floor and breathing mechanics with all exercises, moderate verbal cues for coordination and technique. Pt tolerated well and very motivated to participate. Pt would benefit from additional PT to further address deficits.     OBJECTIVE IMPAIRMENTS decreased coordination, decreased endurance, decreased strength, impaired flexibility, improper body mechanics, and postural dysfunction.   ACTIVITY LIMITATIONS continence  PARTICIPATION LIMITATIONS: community activity  PERSONAL FACTORS Time since onset of injury/illness/exacerbation and 1 comorbidity: 2 vaginal births one with tearing, post menopausal   are also affecting patient's functional outcome.   REHAB POTENTIAL:  Good  CLINICAL DECISION MAKING:  Stable/uncomplicated  EVALUATION COMPLEXITY: Low   GOALS: Goals reviewed with patient? Yes  SHORT TERM GOALS: Target date: 07/17/2021  Pt to be I with HEP.  Baseline: Goal status: INITIAL  2.  Pt will have 25% less urgency due to bladder retraining and strengthening  Baseline:  Goal status: INITIAL  3.  Pt to demonstrate at least 3/5 pelvic floor strength and ability to hold for 8s without compensatory strategies for improved pelvic stability and decreased strain at pelvic floor/ decrease leakage.  Baseline:  Goal status: INITIAL   LONG TERM GOALS: Target date: 09/19/2021   Pt to be I with advanced HEP.  Baseline:  Goal status: INITIAL  2.  Pt will have 50% less urgency due to bladder retraining and strengthening  Baseline:  Goal status: INITIAL  3.  Pt to demonstrate at least 4/5 pelvic floor strength for improved pelvic stability and decreased strain at pelvic floor/ decrease leakage.  Baseline:  Goal status: INITIAL  4.  Pt to demonstrate at least 5/5 bil hip strength for improved pelvic stability and functional squats without leakage.  Baseline:  Goal status: INITIAL  5.  Pt to demonstrate improved coordination of pelvic floor and breathing mechanics for strengthening with lifting 15# without leakage for improved QOL.  Baseline:  Goal status: INITIAL  PLAN: PT FREQUENCY: 1x/week  PT DURATION:  8 sessions  PLANNED INTERVENTIONS: Therapeutic exercises, Therapeutic activity, Neuromuscular re-education, Patient/Family education, Joint mobilization, Dry Needling, Spinal mobilization, Cryotherapy, Moist heat, Manual lymph drainage, scar mobilization, Taping, Biofeedback, and Manual therapy  PLAN FOR NEXT SESSION:review knack, coordination of pelvic floor and breathing mechanics with strengthening exercises  Stacy Gardner, PT, DPT 07/27/2308:14 AM

## 2021-07-30 ENCOUNTER — Encounter (INDEPENDENT_AMBULATORY_CARE_PROVIDER_SITE_OTHER): Payer: Self-pay | Admitting: Family Medicine

## 2021-08-02 ENCOUNTER — Ambulatory Visit: Payer: Federal, State, Local not specified - PPO | Admitting: Physical Therapy

## 2021-08-02 DIAGNOSIS — R293 Abnormal posture: Secondary | ICD-10-CM

## 2021-08-02 DIAGNOSIS — M6281 Muscle weakness (generalized): Secondary | ICD-10-CM | POA: Diagnosis not present

## 2021-08-02 DIAGNOSIS — R279 Unspecified lack of coordination: Secondary | ICD-10-CM

## 2021-08-02 NOTE — Therapy (Signed)
OUTPATIENT PHYSICAL THERAPY FEMALE PELVIC TREATMENT   Patient Name: Debbie Castro MRN: [id number] DOB:1960-01-17, 61 y.o., female Today's Date: 08/02/2021   PT End of Session - 08/02/21 1528     Visit Number 4    Date for PT Re-Evaluation 09/19/21    Authorization Type BCBS    PT Start Time 1528    PT Stop Time 1609    PT Time Calculation (min) 41 min    Activity Tolerance Patient tolerated treatment well    Behavior During Therapy [facility] for tasks assessed/performed             Past Medical History:  Diagnosis Date   Allergy    mold.seasonal   Anxiety    occasional   Asthma    Back pain    Bilateral swelling of feet and ankles    Constipation    Diabetes mellitus without complication (HCC)    prediabetic per PCP   HSV-1 infection    Fever blisters.   Hyperlipidemia    Hypertension    Joint pain    Kidney problem    Other fatigue    Prediabetes    Shortness of breath on exertion    Vitamin D deficiency    Past Surgical History:  Procedure Laterality Date   ANKLE SURGERY  01/15/1996   right   COLONOSCOPY     TOTAL ABDOMINAL HYSTERECTOMY  01/15/2003   still has ovaries   WISDOM TOOTH EXTRACTION  01/15/1980   Patient Active Problem List   Diagnosis Date Noted   Type 2 diabetes mellitus with hyperglycemia, without long-term current use of insulin ([place]) 05/17/2021   Vitamin D deficiency 05/17/2021   Trochanteric bursitis of left hip 04/02/2018    PCP: Derinda Late, MD  REFERRING PROVIDER: Nunzio Cobbs, MD  REFERRING DIAG: N39.3 (ICD-10-CM) - Stress incontinence  THERAPY DIAG:  Unspecified lack of coordination  Muscle weakness (generalized)  Abnormal posture  Rationale for Evaluation and Treatment Rehabilitation  ONSET DATE: several years  SUBJECTIVE:                                                                                                                                                                                            SUBJECTIVE STATEMENT: Pt reports she has been attempting to decrease pad wearing pads and is now on day two, has worked out twice without pads and had no leakage.   Fluid intake: Yes: 60oz of vitamin water per day, one cup of coffee    Patient confirms identification and approves PT to assess pelvic floor and treatment Yes   PAIN:  Are you  having pain? No   PRECAUTIONS: None  PLOF: Independent  PATIENT GOALS to have less leakage  PERTINENT HISTORY:  stress incontinence, nocturia, and constipation.  hysterectomy, still has ovaries, HSV 1,  Sexual abuse: No  BOWEL MOVEMENT Pain with bowel movement: No Type of bowel movement:Type (Bristol Stool Scale) 4, Frequency daily , and Strain No Fully empty rectum: Yes:   Leakage: No Pads: No Fiber supplement: Yes:    URINATION Pain with urination: No Fully empty bladder: No Stream: Strong Urgency: Yes: sometimes "when I think about it" Frequency: every 2 hours Leakage: Urge to void, Walking to the bathroom, Coughing, Sneezing, and Laughing Pads: Yes: 1 pad per day and night  INTERCOURSE Pain with intercourse: Initial Penetration does report dryness Ability to have vaginal penetration:  Yes:   Climax: not painful Marinoff Scale: 0/3  PREGNANCY Vaginal deliveries 2 Tearing Yes: with first; no tearing with second  C-section deliveries 0 Currently pregnant No  PROLAPSE None    OBJECTIVE:   DIAGNOSTIC FINDINGS:    COGNITION:  Overall cognitive status: Within functional limits for tasks assessed     SENSATION:  Light touch: Appears intact  Proprioception: Appears intact  MUSCLE LENGTH: Hamstrings and adductors limited by 25%  POSTURE:  Rounded shoulders, mild posterior pelvic tilt  LUMBARAROM/PROM  WFL but did report stiffness in all directions  LOWER EXTREMITY ROM:  WFL bil  LOWER EXTREMITY MMT:   Rt hip 4/5 grossly; Lt hip abduction 3/5 all others 4/5; bil knees 5/5, ankles 5/5  PELVIC MMT:    MMT eval  Vaginal 3/5; 6s holds; 5 reps  Internal Anal Sphincter   External Anal Sphincter   Puborectalis   Diastasis Recti   (Blank rows = not tested)        PALPATION:   General  no TTP                External Perineal Exam no TTP, WFL with mild dryness noted                             Internal Pelvic Floor no TTP  TONE: WFL  PROLAPSE: Not seen in hooklying with strong cough  TODAY'S TREATMENT   08/02/2021: Squats 2x10 Green  band rotational palloffs 2x10 each X10 green band trunk rotation resisted in standing with opp hip flexion Bosu ball: one leg on bosu squats x20 Bosu: forward step up and weight shift x20 each  Hip hiking on 6" step no UE support x10 each Pt educated on resources in community for continuing working out or group classes  07/27/21: 2x10 10# Sit to stand from standard chair 4# mario punches x10 Green band palloffs 2x10 each Bridges with ball squeezes 2x10 Bosu: on leg on one leg on ground for squats x10 each Bosu: forward step up and weight shift 2x10 each    PATIENT EDUCATION:  Education details: [id number] Person educated: Patient Education method: Consulting civil engineer, Demonstration, Tactile cues, Verbal cues, and Handouts Education comprehension: verbalized understanding and returned demonstration   HOME EXERCISE PROGRAM: [id number]  ASSESSMENT:  CLINICAL IMPRESSION: Patient session focused on hip and core strengthening with coordination of pelvic floor and breathing mechanics with all exercises, moderate verbal cues for coordination and technique. Increased challenge today for standing and more unstable surfaces or single leg activity, no leakage throughout. Pt tolerated well and very motivated to participate. Pt would benefit from additional PT to further address deficits.     OBJECTIVE  IMPAIRMENTS decreased coordination, decreased endurance, decreased strength, impaired flexibility, improper body mechanics, and postural dysfunction.    ACTIVITY LIMITATIONS continence  PARTICIPATION LIMITATIONS: community activity  PERSONAL FACTORS Time since onset of injury/illness/exacerbation and 1 comorbidity: 2 vaginal births one with tearing, post menopausal   are also affecting patient's functional outcome.   REHAB POTENTIAL: Good  CLINICAL DECISION MAKING: Stable/uncomplicated  EVALUATION COMPLEXITY: Low   GOALS: Goals reviewed with patient? Yes  SHORT TERM GOALS: Target date: 07/17/2021  Pt to be I with HEP.  Baseline: Goal status: MET  2.  Pt will have 25% less urgency due to bladder retraining and strengthening  Baseline:  Goal status: MET  3.  Pt to demonstrate at least 3/5 pelvic floor strength and ability to hold for 8s without compensatory strategies for improved pelvic stability and decreased strain at pelvic floor/ decrease leakage.  Baseline:  Goal status: MET   LONG TERM GOALS: Target date: 09/19/2021   Pt to be I with advanced HEP.  Baseline:  Goal status: INITIAL  2.  Pt will have 50% less urgency due to bladder retraining and strengthening  Baseline:  Goal status: INITIAL  3.  Pt to demonstrate at least 4/5 pelvic floor strength for improved pelvic stability and decreased strain at pelvic floor/ decrease leakage.  Baseline:  Goal status: INITIAL  4.  Pt to demonstrate at least 5/5 bil hip strength for improved pelvic stability and functional squats without leakage.  Baseline:  Goal status: INITIAL  5.  Pt to demonstrate improved coordination of pelvic floor and breathing mechanics for strengthening with lifting 15# without leakage for improved QOL.  Baseline:  Goal status: INITIAL  PLAN: PT FREQUENCY: 1x/week  PT DURATION:  8 sessions  PLANNED INTERVENTIONS: Therapeutic exercises, Therapeutic activity, Neuromuscular re-education, Patient/Family education, Joint mobilization, Dry Needling, Spinal mobilization, Cryotherapy, Moist heat, Manual lymph drainage, scar mobilization, Taping,  Biofeedback, and Manual therapy  PLAN FOR NEXT SESSION:coordination of pelvic floor and breathing mechanics with strengthening exercises   Stacy Gardner, PT, DPT 07/20/233:29 PM

## 2021-08-09 ENCOUNTER — Ambulatory Visit: Payer: Federal, State, Local not specified - PPO | Admitting: Physical Therapy

## 2021-08-09 DIAGNOSIS — R293 Abnormal posture: Secondary | ICD-10-CM | POA: Diagnosis not present

## 2021-08-09 DIAGNOSIS — M6281 Muscle weakness (generalized): Secondary | ICD-10-CM | POA: Diagnosis not present

## 2021-08-09 DIAGNOSIS — R279 Unspecified lack of coordination: Secondary | ICD-10-CM | POA: Diagnosis not present

## 2021-08-09 NOTE — Therapy (Signed)
OUTPATIENT PHYSICAL THERAPY FEMALE PELVIC TREATMENT   Patient Name: Debbie Castro MRN: [id number] DOB:07-Apr-1960, 61 y.o., female Today's Date: 08/09/2021   PT End of Session - 08/09/21 1703     Visit Number 5    Date for PT Re-Evaluation 09/19/21    Authorization Type BCBS    PT Start Time 1616    PT Stop Time 1655    PT Time Calculation (min) 39 min    Activity Tolerance Patient tolerated treatment well    Behavior During Therapy WFL for tasks assessed/performed              Past Medical History:  Diagnosis Date   Allergy    mold.seasonal   Anxiety    occasional   Asthma    Back pain    Bilateral swelling of feet and ankles    Constipation    Diabetes mellitus without complication (HCC)    prediabetic per PCP   HSV-1 infection    Fever blisters.   Hyperlipidemia    Hypertension    Joint pain    Kidney problem    Other fatigue    Prediabetes    Shortness of breath on exertion    Vitamin D deficiency    Past Surgical History:  Procedure Laterality Date   ANKLE SURGERY  01/15/1996   right   COLONOSCOPY     TOTAL ABDOMINAL HYSTERECTOMY  01/15/2003   still has ovaries   WISDOM TOOTH EXTRACTION  01/15/1980   Patient Active Problem List   Diagnosis Date Noted   Type 2 diabetes mellitus with hyperglycemia, without long-term current use of insulin ([place]) 05/17/2021   Vitamin D deficiency 05/17/2021   Trochanteric bursitis of left hip 04/02/2018    PCP: Derinda Late, MD  REFERRING PROVIDER: Nunzio Cobbs, MD  REFERRING DIAG: N39.3 (ICD-10-CM) - Stress incontinence  THERAPY DIAG:  Muscle weakness (generalized)  Unspecified lack of coordination  Rationale for Evaluation and Treatment Rehabilitation  ONSET DATE: several years  SUBJECTIVE:                                                                                                                                                                                           SUBJECTIVE  STATEMENT: Pt reports she had only worn her pad one time this past week, no leakage without the pad, has had sneezing and no leakage.   Fluid intake: Yes: 60oz of vitamin water per day, one cup of coffee    Patient confirms identification and approves PT to assess pelvic floor and treatment Yes   PAIN:  Are you having pain? No  PRECAUTIONS: None  PLOF: Independent  PATIENT GOALS to have less leakage  PERTINENT HISTORY:  stress incontinence, nocturia, and constipation.  hysterectomy, still has ovaries, HSV 1,  Sexual abuse: No  BOWEL MOVEMENT Pain with bowel movement: No Type of bowel movement:Type (Bristol Stool Scale) 4, Frequency daily , and Strain No Fully empty rectum: Yes:   Leakage: No Pads: No Fiber supplement: Yes:    URINATION Pain with urination: No Fully empty bladder: No Stream: Strong Urgency: Yes: sometimes "when I think about it" Frequency: every 2 hours Leakage: Urge to void, Walking to the bathroom, Coughing, Sneezing, and Laughing Pads: Yes: 1 pad per day and night  INTERCOURSE Pain with intercourse: Initial Penetration does report dryness Ability to have vaginal penetration:  Yes:   Climax: not painful Marinoff Scale: 0/3  PREGNANCY Vaginal deliveries 2 Tearing Yes: with first; no tearing with second  C-section deliveries 0 Currently pregnant No  PROLAPSE None    OBJECTIVE:   DIAGNOSTIC FINDINGS:    COGNITION:  Overall cognitive status: Within functional limits for tasks assessed     SENSATION:  Light touch: Appears intact  Proprioception: Appears intact  MUSCLE LENGTH: Hamstrings and adductors limited by 25%  POSTURE:  Rounded shoulders, mild posterior pelvic tilt  LUMBARAROM/PROM  WFL but did report stiffness in all directions  LOWER EXTREMITY ROM:  WFL bil  LOWER EXTREMITY MMT:   Rt hip 4/5 grossly; Lt hip abduction 3/5 all others 4/5; bil knees 5/5, ankles 5/5  PELVIC MMT:   MMT eval  Vaginal 3/5; 6s  holds; 5 reps  Internal Anal Sphincter   External Anal Sphincter   Puborectalis   Diastasis Recti   (Blank rows = not tested)        PALPATION:   General  no TTP                External Perineal Exam no TTP, WFL with mild dryness noted                             Internal Pelvic Floor no TTP  TONE: WFL  PROLAPSE: Not seen in hooklying with strong cough  TODAY'S TREATMENT   08/09/2021: 2x10 squats 20# Bosu: forward step up and weight shift x20 each; one leg on bosu squats x20 Bridges with bil LE on red yoga ball 2x10 Farmers carry 20# and 10# 900' Lunges 2x10 body weight  08/02/2021: Squats 2x10 Green  band rotational palloffs 2x10 each X10 green band trunk rotation resisted in standing with opp hip flexion Bosu ball: one leg on bosu squats x20 Bosu: forward step up and weight shift x20 each  Hip hiking on 6" step no UE support x10 each Pt educated on resources in community for continuing working out or group classes    PATIENT EDUCATION:  Education details: [id number] Person educated: Patient Education method: Consulting civil engineer, Media planner, Corporate treasurer cues, Verbal cues, and Handouts Education comprehension: verbalized understanding and returned demonstration   HOME EXERCISE PROGRAM: [email or web address]  ASSESSMENT:  CLINICAL IMPRESSION: Patient session focused on hip and core strengthening with coordination of pelvic floor and breathing mechanics with all exercises, minimal verbal cues for coordination and technique. Increased challenge today for standing and more unstable surfaces or single leg activity, no leakage throughout. During exercises pt had questions about how to implement these exercises more in her gym workouts, educated on these types of modifications or how to create and increased challenge for her at gym without  increased fall risk. Pt denied additional questions after this and agreed to attempt with verbalized understanding as well.  Pt tolerated well and very  motivated to participate. Pt would benefit from additional PT to further address deficits.     OBJECTIVE IMPAIRMENTS decreased coordination, decreased endurance, decreased strength, impaired flexibility, improper body mechanics, and postural dysfunction.   ACTIVITY LIMITATIONS continence  PARTICIPATION LIMITATIONS: community activity  PERSONAL FACTORS Time since onset of injury/illness/exacerbation and 1 comorbidity: 2 vaginal births one with tearing, post menopausal   are also affecting patient's functional outcome.   REHAB POTENTIAL: Good  CLINICAL DECISION MAKING: Stable/uncomplicated  EVALUATION COMPLEXITY: Low   GOALS: Goals reviewed with patient? Yes  SHORT TERM GOALS: Target date: 07/17/2021  Pt to be I with HEP.  Baseline: Goal status: MET  2.  Pt will have 25% less urgency due to bladder retraining and strengthening  Baseline:  Goal status: MET  3.  Pt to demonstrate at least 3/5 pelvic floor strength and ability to hold for 8s without compensatory strategies for improved pelvic stability and decreased strain at pelvic floor/ decrease leakage.  Baseline:  Goal status: MET   LONG TERM GOALS: Target date: 09/19/2021   Pt to be I with advanced HEP.  Baseline:  Goal status: MET  2.  Pt will have 50% less urgency due to bladder retraining and strengthening  Baseline:  Goal status: MET  3.  Pt to demonstrate at least 4/5 pelvic floor strength for improved pelvic stability and decreased strain at pelvic floor/ decrease leakage.  Baseline:  Goal status: on going  4.  Pt to demonstrate at least 5/5 bil hip strength for improved pelvic stability and functional squats without leakage.  Baseline:  Goal status: MET  5.  Pt to demonstrate improved coordination of pelvic floor and breathing mechanics for strengthening with lifting 15# without leakage for improved QOL.  Baseline:  Goal status: MET  PLAN: PT FREQUENCY: 1x/week  PT DURATION:  8 sessions  PLANNED  INTERVENTIONS: Therapeutic exercises, Therapeutic activity, Neuromuscular re-education, Patient/Family education, Joint mobilization, Dry Needling, Spinal mobilization, Cryotherapy, Moist heat, Manual lymph drainage, scar mobilization, Taping, Biofeedback, and Manual therapy  PLAN FOR NEXT SESSION:coordination of pelvic floor and breathing mechanics with strengthening exercises   Stacy Gardner, PT, DPT 07/27/235:07 PM

## 2021-08-16 ENCOUNTER — Ambulatory Visit: Payer: Federal, State, Local not specified - PPO | Attending: Obstetrics and Gynecology | Admitting: Physical Therapy

## 2021-08-16 DIAGNOSIS — M6281 Muscle weakness (generalized): Secondary | ICD-10-CM | POA: Diagnosis not present

## 2021-08-16 DIAGNOSIS — R279 Unspecified lack of coordination: Secondary | ICD-10-CM | POA: Diagnosis not present

## 2021-08-16 NOTE — Therapy (Signed)
OUTPATIENT PHYSICAL THERAPY FEMALE PELVIC TREATMENT   Patient Name: Debbie Castro MRN: [id number] DOB:Aug 28, 1960, 61 y.o., female Today's Date: 08/16/2021   PT End of Session - 08/16/21 1618     Visit Number 6    Date for PT Re-Evaluation 09/19/21    Authorization Type BCBS    PT Start Time 1615    PT Stop Time 1655    PT Time Calculation (min) 40 min    Activity Tolerance Patient tolerated treatment well    Behavior During Therapy [facility] for tasks assessed/performed              Past Medical History:  Diagnosis Date   Allergy    mold.seasonal   Anxiety    occasional   Asthma    Back pain    Bilateral swelling of feet and ankles    Constipation    Diabetes mellitus without complication (HCC)    prediabetic per PCP   HSV-1 infection    Fever blisters.   Hyperlipidemia    Hypertension    Joint pain    Kidney problem    Other fatigue    Prediabetes    Shortness of breath on exertion    Vitamin D deficiency    Past Surgical History:  Procedure Laterality Date   ANKLE SURGERY  01/15/1996   right   COLONOSCOPY     TOTAL ABDOMINAL HYSTERECTOMY  01/15/2003   still has ovaries   WISDOM TOOTH EXTRACTION  01/15/1980   Patient Active Problem List   Diagnosis Date Noted   Type 2 diabetes mellitus with hyperglycemia, without long-term current use of insulin ([place]) 05/17/2021   Vitamin D deficiency 05/17/2021   Trochanteric bursitis of left hip 04/02/2018    PCP: Derinda Late, MD  REFERRING PROVIDER: Nunzio Cobbs, MD  REFERRING DIAG: N39.3 (ICD-10-CM) - Stress incontinence  THERAPY DIAG:  Muscle weakness (generalized)  Unspecified lack of coordination  Rationale for Evaluation and Treatment Rehabilitation  ONSET DATE: several years  SUBJECTIVE:                                                                                                                                                                                           SUBJECTIVE  STATEMENT: Pt reports she had only worn her pad one time this past week, no leakage without the pad, has had sneezing and no leakage. Pt no longer wearing pads.   Fluid intake: Yes: 60oz of vitamin water per day, one cup of coffee    Patient confirms identification and approves PT to assess pelvic floor and treatment Yes   PAIN:  Are you  having pain? No   PRECAUTIONS: None  PLOF: Independent  PATIENT GOALS to have less leakage  PERTINENT HISTORY:  stress incontinence, nocturia, and constipation.  hysterectomy, still has ovaries, HSV 1,  Sexual abuse: No  BOWEL MOVEMENT Pain with bowel movement: No Type of bowel movement:Type (Bristol Stool Scale) 4, Frequency daily , and Strain No Fully empty rectum: Yes:   Leakage: No Pads: No Fiber supplement: Yes:    URINATION Pain with urination: No Fully empty bladder: No Stream: Strong Urgency: Yes: sometimes "when I think about it" Frequency: every 2 hours Leakage: Urge to void, Walking to the bathroom, Coughing, Sneezing, and Laughing Pads: Yes: 1 pad per day and night  INTERCOURSE Pain with intercourse: Initial Penetration does report dryness Ability to have vaginal penetration:  Yes:   Climax: not painful Marinoff Scale: 0/3  PREGNANCY Vaginal deliveries 2 Tearing Yes: with first; no tearing with second  C-section deliveries 0 Currently pregnant No  PROLAPSE None    OBJECTIVE:   DIAGNOSTIC FINDINGS:    COGNITION:  Overall cognitive status: Within functional limits for tasks assessed     SENSATION:  Light touch: Appears intact  Proprioception: Appears intact  MUSCLE LENGTH: Hamstrings and adductors limited by 25%  POSTURE:  Rounded shoulders, mild posterior pelvic tilt  LUMBARAROM/PROM  WFL but did report stiffness in all directions  LOWER EXTREMITY ROM:  WFL bil  LOWER EXTREMITY MMT:   Rt hip 4/5 grossly; Lt hip abduction 3/5 all others 4/5; bil knees 5/5, ankles 5/5  PELVIC MMT:   MMT  eval 08/16/2021   Vaginal 3/5; 6s holds; 5 reps 5/5; 10 reps; 10s  Internal Anal Sphincter    External Anal Sphincter    Puborectalis    Diastasis Recti    (Blank rows = not tested)        PALPATION:   General  no TTP                External Perineal Exam no TTP, WFL with mild dryness noted                             Internal Pelvic Floor no TTP  TONE: WFL  PROLAPSE: Not seen in hooklying with strong cough  TODAY'S TREATMENT   08/16/2021: Internal pelvic floor: pt consented to vaginal reassessment this date, findings above. X10 pelvic floor contractions, B58 quick flicks; X09 isometrics for 10s 2x10 squats 20# Trampoline 1 min mini bounding, 1 min in staggered stance mini bounding Lunges 2x10  Step through on/over bosu ball x20 2x10 one leg on bosu one on ground each leg   08/09/2021: 2x10 squats 20# Bosu: forward step up and weight shift x20 each; one leg on bosu squats x20 Bridges with bil LE on red yoga ball 2x10 Farmers carry 20# and 10# 900' Lunges 2x10 body weight    PATIENT EDUCATION:  Education details: [id number] Person educated: Patient Education method: Explanation, Demonstration, Tactile cues, Verbal cues, and Handouts Education comprehension: verbalized understanding and returned demonstration   HOME EXERCISE PROGRAM: [email or web address]  ASSESSMENT:  CLINICAL IMPRESSION: Patient session focused on internal pelvic floor reassessment for DC, pt consented. Pt found to have increased strength, endurance, and coordination. After this, pt dressed and agreeable to strengthening in gym, which focused on hip and core strengthening all exercises, pt did not need cues for pelvic floor and breathing mechanics but did ask to make sure her technique was correct. Which it was. Pt  reported and demonstrated a good understanding of mechanics and technique throughout exercises. Pt tolerated well, no leakage during session. Pt has met all goals and ready for DC post treatment today. Pt  denied questions and reports a very positive experience with PFPT.    OBJECTIVE IMPAIRMENTS decreased coordination, decreased endurance, decreased strength, impaired flexibility, improper body mechanics, and postural dysfunction.   ACTIVITY LIMITATIONS continence  PARTICIPATION LIMITATIONS: community activity  PERSONAL FACTORS Time since onset of injury/illness/exacerbation and 1 comorbidity: 2 vaginal births one with tearing, post menopausal   are also affecting patient's functional outcome.   REHAB POTENTIAL: Good  CLINICAL DECISION MAKING: Stable/uncomplicated  EVALUATION COMPLEXITY: Low   GOALS: Goals reviewed with patient? Yes  SHORT TERM GOALS: Target date: 07/17/2021  Pt to be I with HEP.  Baseline: Goal status: MET  2.  Pt will have 25% less urgency due to bladder retraining and strengthening  Baseline:  Goal status: MET  3.  Pt to demonstrate at least 3/5 pelvic floor strength and ability to hold for 8s without compensatory strategies for improved pelvic stability and decreased strain at pelvic floor/ decrease leakage.  Baseline:  Goal status: MET   LONG TERM GOALS: Target date: 09/19/2021   Pt to be I with advanced HEP.  Baseline:  Goal status: MET  2.  Pt will have 50% less urgency due to bladder retraining and strengthening  Baseline:  Goal status: MET  3.  Pt to demonstrate at least 4/5 pelvic floor strength for improved pelvic stability and decreased strain at pelvic floor/ decrease leakage.  Baseline:  Goal status:MET  4.  Pt to demonstrate at least 5/5 bil hip strength for improved pelvic stability and functional squats without leakage.  Baseline:  Goal status: MET  5.  Pt to demonstrate improved coordination of pelvic floor and breathing mechanics for strengthening with lifting 15# without leakage for improved QOL.  Baseline:  Goal status: MET  PLAN: PT FREQUENCY: 1x/week  PT DURATION:  8 sessions  PLANNED INTERVENTIONS: Therapeutic  exercises, Therapeutic activity, Neuromuscular re-education, Patient/Family education, Joint mobilization, Dry Needling, Spinal mobilization, Cryotherapy, Moist heat, Manual lymph drainage, scar mobilization, Taping, Biofeedback, and Manual therapy    PHYSICAL THERAPY DISCHARGE SUMMARY  Visits from Start of Care: 6  Current functional level related to goals / functional outcomes: All goals met   Remaining deficits: All goals met   Education / Equipment: HEP   Patient agrees to discharge. Patient goals were met. Patient is being discharged due to meeting the stated rehab goals. Thank you for the referral.    Stacy Gardner, PT, DPT 08/03/235:01 PM

## 2021-08-22 ENCOUNTER — Encounter (INDEPENDENT_AMBULATORY_CARE_PROVIDER_SITE_OTHER): Payer: Self-pay

## 2021-08-30 ENCOUNTER — Ambulatory Visit (INDEPENDENT_AMBULATORY_CARE_PROVIDER_SITE_OTHER): Payer: Federal, State, Local not specified - PPO | Admitting: Family Medicine

## 2021-09-05 ENCOUNTER — Encounter (INDEPENDENT_AMBULATORY_CARE_PROVIDER_SITE_OTHER): Payer: Self-pay | Admitting: Family Medicine

## 2021-09-05 ENCOUNTER — Ambulatory Visit (INDEPENDENT_AMBULATORY_CARE_PROVIDER_SITE_OTHER): Payer: Federal, State, Local not specified - PPO | Admitting: Family Medicine

## 2021-09-05 VITALS — BP 137/75 | HR 69 | Temp 97.8°F | Ht 63.0 in | Wt 169.0 lb

## 2021-09-05 DIAGNOSIS — Z7985 Long-term (current) use of injectable non-insulin antidiabetic drugs: Secondary | ICD-10-CM

## 2021-09-05 DIAGNOSIS — E669 Obesity, unspecified: Secondary | ICD-10-CM | POA: Diagnosis not present

## 2021-09-05 DIAGNOSIS — E785 Hyperlipidemia, unspecified: Secondary | ICD-10-CM | POA: Diagnosis not present

## 2021-09-05 DIAGNOSIS — E1169 Type 2 diabetes mellitus with other specified complication: Secondary | ICD-10-CM

## 2021-09-05 DIAGNOSIS — E1165 Type 2 diabetes mellitus with hyperglycemia: Secondary | ICD-10-CM

## 2021-09-05 DIAGNOSIS — Z6830 Body mass index (BMI) 30.0-30.9, adult: Secondary | ICD-10-CM

## 2021-09-05 MED ORDER — OZEMPIC (0.25 OR 0.5 MG/DOSE) 2 MG/1.5ML ~~LOC~~ SOPN: 0.2500 mg | PEN_INJECTOR | SUBCUTANEOUS | 0 refills | Status: DC

## 2021-09-17 NOTE — Progress Notes (Signed)
Chief Complaint:   OBESITY Debbie Castro is here to discuss her progress with her obesity treatment plan along with follow-up of her obesity related diagnoses. Debbie Castro is on the Category 2 Plan and states she is following her eating plan approximately 50% of the time. Debbie Castro states she is going to the gym for 60 minutes 5 times a week and walking the dog for 30 minutes 7 days a week.   Today's visit was #: 72 Starting weight: 226 lbs Starting date: 07/18/2020 Today's weight: 169 lbs Today's date: 09/05/2021 Total lbs lost to date: 57 lbs Total lbs lost since last in-office visit: 0 lbs  Interim History: Debbie Castro is still working on moving parents into the house she and her sister bought for their parents. Foodwise, she has tried to stay conscious of choices she was making but can recognize that she was skewed I her macro intake. In the next few weeks Debbie Castro anticipates to be more of the same in terms of schedule. Breakfast is protein cereal. She has had more frequent snacking, particularly if she does not get a [place] sandwich in. Supper is the hardest to ensure she gets nutrition in.  Subjective:   1. Type 2 diabetes mellitus with hyperglycemia, without long-term current use of insulin (HCC) Loanne is on Ozempic weekly and states no GI symptoms. Her last A1c level was 5.5 and Insulin was 7.2  2. Hyperlipidemia associated with type 2 diabetes mellitus ([place]) Debbie Castro is taking Crestor 20 mg daily, Her last fasting lipid panel was within normal limits.  Assessment/Plan:   1. Type 2 diabetes mellitus with hyperglycemia, without long-term current use of insulin (HCC) Debbie Castro will continue Ozempic and will refill as follows: - Semaglutide,0.25 or 0.'5MG'$ /DOS, (OZEMPIC, 0.25 OR 0.5 MG/DOSE,) 2 MG/1.5ML SOPN; Inject 0.25 mg into the skin once a week.  Dispense: 2 mL; Refill: 0  2. Hyperlipidemia associated with type 2 diabetes mellitus ([place]) Debbie Castro is as goal and will continue current regimen of  Crestor.  3. Obesity with current BMI of 30.1 Debbie Castro is currently in the action stage of change. As such, her goal is to continue with weight loss efforts. She has agreed to the Category 2 Plan.   Exercise goals: As is.  Behavioral modification strategies: increasing lean protein intake, meal planning and cooking strategies, keeping healthy foods in the home, and planning for success.  Debbie Castro has agreed to follow-up with our clinic in 4-5 weeks. She was informed of the importance of frequent follow-up visits to maximize her success with intensive lifestyle modifications for her multiple health conditions.   Objective:   Blood pressure 137/75, pulse 69, temperature 97.8 F (36.6 C), height '5\' 3"'$  (1.6 m), weight 169 lb (76.7 kg), SpO2 98 %. Body mass index is 29.94 kg/m.  General: Cooperative, alert, well developed, in no acute distress. HEENT: Conjunctivae and lids unremarkable. Cardiovascular: Regular rhythm.  Lungs: Normal work of breathing. Neurologic: No focal deficits.   Lab Results  Component Value Date   CREATININE 0.69 06/18/2021   BUN 15 06/18/2021   NA 140 06/18/2021   K 3.8 06/18/2021   CL 101 06/18/2021   CO2 26 06/18/2021   Lab Results  Component Value Date   ALT 42 (H) 06/18/2021   AST 35 06/18/2021   ALKPHOS 75 06/18/2021   BILITOT 0.5 06/18/2021   Lab Results  Component Value Date   HGBA1C 5.5 06/18/2021   HGBA1C 5.5 12/13/2020   Lab Results  Component Value Date   INSULIN  7.2 06/18/2021   INSULIN 15.1 07/18/2020   Lab Results  Component Value Date   TSH 0.99 12/13/2020   Lab Results  Component Value Date   CHOL 96 (L) 06/18/2021   HDL 48 06/18/2021   LDLCALC 32 06/18/2021   TRIG 80 06/18/2021   Lab Results  Component Value Date   VD25OH 72.1 06/18/2021   VD25OH 77.8 12/13/2020   VD25OH 71.8 07/18/2020   Lab Results  Component Value Date   WBC 7.1 12/13/2020   HGB 14.1 12/13/2020   HCT 43 12/13/2020   HCT 43 12/13/2020   MCV 91  07/18/2020   PLT 241 12/13/2020   No results found for: "IRON", "TIBC", "FERRITIN"  Attestation Statements:   Reviewed by clinician on day of visit: allergies, medications, problem list, medical history, surgical history, family history, social history, and previous encounter notes.  ILennette Bihari, CMA, am acting as transcriptionist for Dr. Coralie Common, MD.   I have reviewed the above documentation for accuracy and completeness, and I agree with the above. - Coralie Common, MD

## 2021-10-04 ENCOUNTER — Ambulatory Visit (INDEPENDENT_AMBULATORY_CARE_PROVIDER_SITE_OTHER): Payer: Federal, State, Local not specified - PPO | Admitting: Family Medicine

## 2021-10-04 ENCOUNTER — Encounter (INDEPENDENT_AMBULATORY_CARE_PROVIDER_SITE_OTHER): Payer: Self-pay | Admitting: Family Medicine

## 2021-10-04 VITALS — BP 117/75 | HR 85 | Temp 98.1°F | Ht 63.0 in | Wt 169.0 lb

## 2021-10-04 DIAGNOSIS — E785 Hyperlipidemia, unspecified: Secondary | ICD-10-CM | POA: Diagnosis not present

## 2021-10-04 DIAGNOSIS — Z7984 Long term (current) use of oral hypoglycemic drugs: Secondary | ICD-10-CM

## 2021-10-04 DIAGNOSIS — E1165 Type 2 diabetes mellitus with hyperglycemia: Secondary | ICD-10-CM | POA: Diagnosis not present

## 2021-10-04 DIAGNOSIS — E1169 Type 2 diabetes mellitus with other specified complication: Secondary | ICD-10-CM

## 2021-10-04 DIAGNOSIS — Z7985 Long-term (current) use of injectable non-insulin antidiabetic drugs: Secondary | ICD-10-CM

## 2021-10-04 DIAGNOSIS — Z6830 Body mass index (BMI) 30.0-30.9, adult: Secondary | ICD-10-CM

## 2021-10-04 DIAGNOSIS — E669 Obesity, unspecified: Secondary | ICD-10-CM | POA: Diagnosis not present

## 2021-10-09 NOTE — Progress Notes (Signed)
Chief Complaint:   OBESITY Debbie Castro is here to discuss her progress with her obesity treatment plan along with follow-up of her obesity related diagnoses. Debbie Castro is on the Category 2 Plan and states she is following her eating plan approximately 50% of the time. Debbie Castro states she is gym,walking 60 minutes 5 times per week.  Today's visit was #: 18 Starting weight: 226 lbs Starting date: 07/18/2020 Today's weight: 169 lbs Today's date: 10/04/2021 Total lbs lost to date: 57 lbs Total lbs lost since last in-office visit: 0  Interim History: Debbie Castro went on vacation since last appointment (went to beach for almost 1 week). Finished moving parents into their house. Trying to stay on plan as much as she can. Has been snacking--maybe more than she was previously. Notices she struggles with meat and protein. 2 birthday parties coming up.  Subjective:   1. Type 2 diabetes mellitus with hyperglycemia, without long-term current use of insulin (HCC) Debbie Castro is on Ozempic and Metformin. Last A1c 5.5, insulin 7.2.  2. Hyperlipidemia associated with type 2 diabetes mellitus ([place]) Debbie Castro is on Crestor 40 mg daily with no myalgias or transaminitis.  Assessment/Plan:   1. Type 2 diabetes mellitus with hyperglycemia, without long-term current use of insulin (HCC) Continue Ozempic and Metformin without changes in medication or dose.  2. Hyperlipidemia associated with type 2 diabetes mellitus ([place]) Continue with Crestor without changes in dose. Labs with PCP in Dec.  3. Obesity with current BMI of 30.0 Debbie Castro is currently in the action stage of change. As such, her goal is to continue with weight loss efforts. She has agreed to the Category 2 Plan.   Exercise goals: As is.  Behavioral modification strategies: increasing lean protein intake, meal planning and cooking strategies, keeping healthy foods in the home, and planning for success.  Debbie Castro has agreed to follow-up with our clinic in 6 weeks.  She was informed of the importance of frequent follow-up visits to maximize her success with intensive lifestyle modifications for her multiple health conditions.   Objective:   Blood pressure 117/75, pulse 85, temperature 98.1 F (36.7 C), height '5\' 3"'$  (1.6 m), weight 169 lb (76.7 kg), SpO2 97 %. Body mass index is 29.94 kg/m.  General: Cooperative, alert, well developed, in no acute distress. HEENT: Conjunctivae and lids unremarkable. Cardiovascular: Regular rhythm.  Lungs: Normal work of breathing. Neurologic: No focal deficits.   Lab Results  Component Value Date   CREATININE 0.69 06/18/2021   BUN 15 06/18/2021   NA 140 06/18/2021   K 3.8 06/18/2021   CL 101 06/18/2021   CO2 26 06/18/2021   Lab Results  Component Value Date   ALT 42 (H) 06/18/2021   AST 35 06/18/2021   ALKPHOS 75 06/18/2021   BILITOT 0.5 06/18/2021   Lab Results  Component Value Date   HGBA1C 5.5 06/18/2021   HGBA1C 5.5 12/13/2020   Lab Results  Component Value Date   INSULIN 7.2 06/18/2021   INSULIN 15.1 07/18/2020   Lab Results  Component Value Date   TSH 0.99 12/13/2020   Lab Results  Component Value Date   CHOL 96 (L) 06/18/2021   HDL 48 06/18/2021   LDLCALC 32 06/18/2021   TRIG 80 06/18/2021   Lab Results  Component Value Date   VD25OH 72.1 06/18/2021   VD25OH 77.8 12/13/2020   VD25OH 71.8 07/18/2020   Lab Results  Component Value Date   WBC 7.1 12/13/2020   HGB 14.1 12/13/2020   HCT  43 12/13/2020   HCT 43 12/13/2020   MCV 91 07/18/2020   PLT 241 12/13/2020   No results found for: "IRON", "TIBC", "FERRITIN"  Attestation Statements:   Reviewed by clinician on day of visit: allergies, medications, problem list, medical history, surgical history, family history, social history, and previous encounter notes.  I, Elnora Morrison, RMA am acting as transcriptionist for Coralie Common, MD.  I have reviewed the above documentation for accuracy and completeness, and I agree  with the above. - Coralie Common, MD

## 2021-10-22 DIAGNOSIS — L309 Dermatitis, unspecified: Secondary | ICD-10-CM | POA: Diagnosis not present

## 2021-10-29 DIAGNOSIS — L852 Keratosis punctata (palmaris et plantaris): Secondary | ICD-10-CM | POA: Diagnosis not present

## 2021-11-29 ENCOUNTER — Ambulatory Visit (INDEPENDENT_AMBULATORY_CARE_PROVIDER_SITE_OTHER): Payer: Federal, State, Local not specified - PPO | Admitting: Family Medicine

## 2021-11-29 ENCOUNTER — Encounter (INDEPENDENT_AMBULATORY_CARE_PROVIDER_SITE_OTHER): Payer: Self-pay | Admitting: Family Medicine

## 2021-11-29 VITALS — BP 174/80 | HR 75 | Temp 98.0°F | Ht 63.0 in | Wt 169.0 lb

## 2021-11-29 DIAGNOSIS — Z7984 Long term (current) use of oral hypoglycemic drugs: Secondary | ICD-10-CM

## 2021-11-29 DIAGNOSIS — I152 Hypertension secondary to endocrine disorders: Secondary | ICD-10-CM

## 2021-11-29 DIAGNOSIS — E1159 Type 2 diabetes mellitus with other circulatory complications: Secondary | ICD-10-CM | POA: Diagnosis not present

## 2021-11-29 DIAGNOSIS — E1165 Type 2 diabetes mellitus with hyperglycemia: Secondary | ICD-10-CM

## 2021-11-29 DIAGNOSIS — E669 Obesity, unspecified: Secondary | ICD-10-CM | POA: Diagnosis not present

## 2021-11-29 DIAGNOSIS — Z6830 Body mass index (BMI) 30.0-30.9, adult: Secondary | ICD-10-CM

## 2021-11-29 DIAGNOSIS — Z6841 Body Mass Index (BMI) 40.0 and over, adult: Secondary | ICD-10-CM

## 2021-11-29 MED ORDER — LISINOPRIL 5 MG PO TABS: 5.0000 mg | ORAL_TABLET | Freq: Every day | ORAL | 0 refills | Status: DC

## 2021-11-29 MED ORDER — OZEMPIC (0.25 OR 0.5 MG/DOSE) 2 MG/1.5ML ~~LOC~~ SOPN: 0.2500 mg | PEN_INJECTOR | SUBCUTANEOUS | 0 refills | Status: DC

## 2021-12-12 NOTE — Progress Notes (Signed)
Chief Complaint:   OBESITY Debbie Castro is here to discuss her progress with her obesity treatment plan along with follow-up of her obesity related diagnoses. Debbie Castro is on the Category 2 Plan and states she is following her eating plan approximately 50% of the time. Debbie Castro states she is going to the gym/walking 45 minutes 5 times per week.  Today's visit was #: 20 Starting weight: 226 lbs Starting date: 07/18/2020 Today's weight: 169 lbs Today's date: 11/29/2021 Total lbs lost to date: 57 lbs Total lbs lost since last in-office visit: 0  Interim History: Debbie Castro voices she has been on plan 50% and off 50%. She had [place] food--1 week ago, made homemade soup about 1 week ago. For Thanksgiving she is cooking [place] and having typical celebrating foods.  Subjective:   1. Type 2 diabetes mellitus with hyperglycemia, without long-term current use of insulin (HCC) Debbie Castro is currently on Ozempic. Denies GI side effects. Her last A1c was 5.5, insulin 7.2.  2. Hypertension associated with diabetes (Debbie Castro) Debbie Castro was previously on blood pressure medications and blood pressure is controlled. Tapered off medications and blood pressure stayed controlled.  Assessment/Plan:   1. Type 2 diabetes mellitus with hyperglycemia, without long-term current use of insulin (HCC) We will refill Ozempic 0.25 mg SubQ once weekly for 1 month with 0 refills.  -Refill Semaglutide,0.25 or 0.'5MG'$ /DOS, (OZEMPIC, 0.25 OR 0.5 MG/DOSE,) 2 MG/1.5ML SOPN; Inject 0.25 mg into the skin once a week.  Dispense: 2 mL; Refill: 0  2. Hypertension associated with diabetes ([place]) We will refill Lisinopril 5 mg daily for 3 months with 0 refills.   -Refill lisinopril (ZESTRIL) 5 MG tablet; Take 1 tablet (5 mg total) by mouth daily.  Dispense: 90 tablet; Refill: 0  3. Obesity with current BMI of 30.0 Debbie Castro is currently in the action stage of change. As such, her goal is to continue with weight loss efforts. She has agreed to the  Category 2 Plan.   Exercise goals: All adults should avoid inactivity. Some physical activity is better than none, and adults who participate in any amount of physical activity gain some health benefits.  Behavioral modification strategies: increasing lean protein intake, meal planning and cooking strategies, keeping healthy foods in the home, and planning for success.  Debbie Castro has agreed to follow-up with our clinic in 6 weeks. She was informed of the importance of frequent follow-up visits to maximize her success with intensive lifestyle modifications for her multiple health conditions.   Objective:   Blood pressure (!) 174/80, pulse 75, temperature 98 F (36.7 C), height '5\' 3"'$  (1.6 m), weight 169 lb (76.7 kg), SpO2 98 %. Body mass index is 29.94 kg/m.  General: Cooperative, alert, well developed, in no acute distress. HEENT: Conjunctivae and lids unremarkable. Cardiovascular: Regular rhythm.  Lungs: Normal work of breathing. Neurologic: No focal deficits.   Lab Results  Component Value Date   CREATININE 0.69 06/18/2021   BUN 15 06/18/2021   NA 140 06/18/2021   K 3.8 06/18/2021   CL 101 06/18/2021   CO2 26 06/18/2021   Lab Results  Component Value Date   ALT 42 (H) 06/18/2021   AST 35 06/18/2021   ALKPHOS 75 06/18/2021   BILITOT 0.5 06/18/2021   Lab Results  Component Value Date   HGBA1C 5.5 06/18/2021   HGBA1C 5.5 12/13/2020   Lab Results  Component Value Date   INSULIN 7.2 06/18/2021   INSULIN 15.1 07/18/2020   Lab Results  Component Value Date  TSH 0.99 12/13/2020   Lab Results  Component Value Date   CHOL 96 (L) 06/18/2021   HDL 48 06/18/2021   LDLCALC 32 06/18/2021   TRIG 80 06/18/2021   Lab Results  Component Value Date   VD25OH 72.1 06/18/2021   VD25OH 77.8 12/13/2020   VD25OH 71.8 07/18/2020   Lab Results  Component Value Date   WBC 7.1 12/13/2020   HGB 14.1 12/13/2020   HCT 43 12/13/2020   HCT 43 12/13/2020   MCV 91 07/18/2020   PLT  241 12/13/2020   No results found for: "IRON", "TIBC", "FERRITIN"  Attestation Statements:   Reviewed by clinician on day of visit: allergies, medications, problem list, medical history, surgical history, family history, social history, and previous encounter notes.  I, Elnora Morrison, RMA am acting as transcriptionist for Coralie Common, MD.  I have reviewed the above documentation for accuracy and completeness, and I agree with the above. - Coralie Common, MD

## 2021-12-18 LAB — HEMOGLOBIN A1C: Hemoglobin A1C: 5.4

## 2021-12-18 LAB — LIPID PANEL
Cholesterol: 89 (ref 0–200)
HDL: 47 (ref 35–70)
LDL Cholesterol: 27
Triglycerides: 72 (ref 40–160)

## 2021-12-18 LAB — VITAMIN D 25 HYDROXY (VIT D DEFICIENCY, FRACTURES): Vit D, 25-Hydroxy: 53.3

## 2021-12-18 LAB — BASIC METABOLIC PANEL
BUN: 18 (ref 4–21)
CO2: 27 — AB (ref 13–22)
Chloride: 104 (ref 99–108)
Creatinine: 0.7 (ref 0.5–1.1)
Glucose: 88
Potassium: 4.2 mEq/L (ref 3.5–5.1)
Sodium: 141 (ref 137–147)

## 2021-12-18 LAB — CBC AND DIFFERENTIAL
Hemoglobin: 13.1 (ref 12.0–16.0)
WBC: 5.3

## 2021-12-18 LAB — COMPREHENSIVE METABOLIC PANEL: Calcium: 8.9 (ref 8.7–10.7)

## 2021-12-18 LAB — CBC: RBC: 4.42 (ref 3.87–5.11)

## 2021-12-18 LAB — HEPATIC FUNCTION PANEL
ALT: 14 U/L (ref 7–35)
AST: 16 (ref 13–35)
Bilirubin, Total: 0.4

## 2021-12-25 ENCOUNTER — Encounter (INDEPENDENT_AMBULATORY_CARE_PROVIDER_SITE_OTHER): Payer: Self-pay | Admitting: Family Medicine

## 2021-12-25 DIAGNOSIS — E119 Type 2 diabetes mellitus without complications: Secondary | ICD-10-CM | POA: Diagnosis not present

## 2021-12-27 NOTE — Telephone Encounter (Signed)
Please review

## 2021-12-28 DIAGNOSIS — Z0000 Encounter for general adult medical examination without abnormal findings: Secondary | ICD-10-CM | POA: Diagnosis not present

## 2021-12-28 DIAGNOSIS — E119 Type 2 diabetes mellitus without complications: Secondary | ICD-10-CM | POA: Diagnosis not present

## 2021-12-28 DIAGNOSIS — J4520 Mild intermittent asthma, uncomplicated: Secondary | ICD-10-CM | POA: Diagnosis not present

## 2021-12-28 DIAGNOSIS — E782 Mixed hyperlipidemia: Secondary | ICD-10-CM | POA: Diagnosis not present

## 2021-12-28 DIAGNOSIS — I10 Essential (primary) hypertension: Secondary | ICD-10-CM | POA: Diagnosis not present

## 2021-12-31 DIAGNOSIS — L570 Actinic keratosis: Secondary | ICD-10-CM | POA: Diagnosis not present

## 2021-12-31 DIAGNOSIS — Z85828 Personal history of other malignant neoplasm of skin: Secondary | ICD-10-CM | POA: Diagnosis not present

## 2021-12-31 DIAGNOSIS — D1801 Hemangioma of skin and subcutaneous tissue: Secondary | ICD-10-CM | POA: Diagnosis not present

## 2021-12-31 DIAGNOSIS — D224 Melanocytic nevi of scalp and neck: Secondary | ICD-10-CM | POA: Diagnosis not present

## 2021-12-31 DIAGNOSIS — L821 Other seborrheic keratosis: Secondary | ICD-10-CM | POA: Diagnosis not present

## 2022-01-17 ENCOUNTER — Ambulatory Visit (INDEPENDENT_AMBULATORY_CARE_PROVIDER_SITE_OTHER): Payer: Federal, State, Local not specified - PPO | Admitting: Family Medicine

## 2022-01-17 ENCOUNTER — Encounter (INDEPENDENT_AMBULATORY_CARE_PROVIDER_SITE_OTHER): Payer: Self-pay | Admitting: Family Medicine

## 2022-01-17 VITALS — BP 166/88 | HR 75 | Temp 98.2°F | Ht 63.0 in | Wt 171.0 lb

## 2022-01-17 DIAGNOSIS — E669 Obesity, unspecified: Secondary | ICD-10-CM | POA: Diagnosis not present

## 2022-01-17 DIAGNOSIS — Z6830 Body mass index (BMI) 30.0-30.9, adult: Secondary | ICD-10-CM

## 2022-01-17 DIAGNOSIS — E1165 Type 2 diabetes mellitus with hyperglycemia: Secondary | ICD-10-CM | POA: Diagnosis not present

## 2022-01-17 DIAGNOSIS — I10 Essential (primary) hypertension: Secondary | ICD-10-CM | POA: Diagnosis not present

## 2022-01-17 DIAGNOSIS — Z7985 Long-term (current) use of injectable non-insulin antidiabetic drugs: Secondary | ICD-10-CM

## 2022-01-17 DIAGNOSIS — Z7984 Long term (current) use of oral hypoglycemic drugs: Secondary | ICD-10-CM

## 2022-01-17 MED ORDER — METFORMIN HCL ER 500 MG PO TB24: 1000.0000 mg | ORAL_TABLET | Freq: Every day | ORAL | 0 refills | Status: DC

## 2022-01-17 MED ORDER — OZEMPIC (0.25 OR 0.5 MG/DOSE) 2 MG/1.5ML ~~LOC~~ SOPN: 0.2500 mg | PEN_INJECTOR | SUBCUTANEOUS | 0 refills | Status: DC

## 2022-01-28 NOTE — Progress Notes (Signed)
Chief Complaint:   OBESITY Debbie Castro is here to discuss her progress with her obesity treatment plan along with follow-up of her obesity related diagnoses. Debbie Castro is on the Category 2 Plan and states she is following her eating plan approximately 25% of the time. Debbie Castro states she is walking 30-45 minutes 4-5 times per week.  Today's visit was #: 20 Starting weight: 226 lbs Starting date: 07/18/2020 Today's weight: 171 lbs Today's date: 01/17/2022 Total lbs lost to date: 55 lbs Total lbs lost since last in-office visit: 0  Interim History: Debbie Castro had her yearly physical with Dr.Blomgren.  Struggling to adhere to meal plan over the holiday due to increase in indulgences.  Wants to get back to category 2.  Has a birthday coming up in 2 days.  Subjective:   1. Essential hypertension Debbie Castro stopped Lisinopril and has been monitoring blood pressure and blood pressure has been elevated without medicine.  2. Type 2 diabetes mellitus with hyperglycemia, without long-term current use of insulin (HCC) Debbie Castro is on Metformin and Ozempic.  Previously on 2K IU a day.  She reports last A1c was 5..  Assessment/Plan:   1. Essential hypertension Restart Lisinopril 5 mg daily.  No refills needed.  2. Type 2 diabetes mellitus with hyperglycemia, without long-term current use of insulin (HCC) Decrease/refill to 1000 mg daily for 1 month with 0 refills.  Repeat labs in 3 months--if A1c still controlled will discuss stopping Ozempic.  -Decrease/Refill MetFORMIN (GLUCOPHAGE-XR) 500 MG 24 hr tablet; Take 2 tablets (1,000 mg total) by mouth daily.  Dispense: 180 tablet; Refill: 0  -Refill Semaglutide,0.25 or 0.'5MG'$ /DOS, (OZEMPIC, 0.25 OR 0.5 MG/DOSE,) 2 MG/1.5ML SOPN; Inject 0.25 mg into the skin once a week.  Dispense: 2 mL; Refill: 0  3. Obesity with current BMI of 30.3 Debbie Castro is currently in the action stage of change. As such, her goal is to continue with weight loss efforts. She has agreed to the  Category 2 Plan.   Discussed Meal plan and whether or not she wants to change.  Exercise goals: No exercise has been prescribed at this time.  Behavioral modification strategies: increasing lean protein intake, meal planning and cooking strategies, keeping healthy foods in the home, and planning for success.  Debbie Castro has agreed to follow-up with our clinic in 4 weeks. She was informed of the importance of frequent follow-up visits to maximize her success with intensive lifestyle modifications for her multiple health conditions.   Objective:   Blood pressure (!) 166/88, pulse 75, temperature 98.2 F (36.8 C), height '5\' 3"'$  (1.6 m), weight 171 lb (77.6 kg), SpO2 97 %. Body mass index is 30.29 kg/m.  General: Cooperative, alert, well developed, in no acute distress. HEENT: Conjunctivae and lids unremarkable. Cardiovascular: Regular rhythm.  Lungs: Normal work of breathing. Neurologic: No focal deficits.   Lab Results  Component Value Date   CREATININE 0.69 06/18/2021   BUN 15 06/18/2021   NA 140 06/18/2021   K 3.8 06/18/2021   CL 101 06/18/2021   CO2 26 06/18/2021   Lab Results  Component Value Date   ALT 42 (H) 06/18/2021   AST 35 06/18/2021   ALKPHOS 75 06/18/2021   BILITOT 0.5 06/18/2021   Lab Results  Component Value Date   HGBA1C 5.5 06/18/2021   HGBA1C 5.5 12/13/2020   Lab Results  Component Value Date   INSULIN 7.2 06/18/2021   INSULIN 15.1 07/18/2020   Lab Results  Component Value Date   TSH 0.99 12/13/2020  Lab Results  Component Value Date   CHOL 96 (L) 06/18/2021   HDL 48 06/18/2021   LDLCALC 32 06/18/2021   TRIG 80 06/18/2021   Lab Results  Component Value Date   VD25OH 72.1 06/18/2021   VD25OH 77.8 12/13/2020   VD25OH 71.8 07/18/2020   Lab Results  Component Value Date   WBC 7.1 12/13/2020   HGB 14.1 12/13/2020   HCT 43 12/13/2020   HCT 43 12/13/2020   MCV 91 07/18/2020   PLT 241 12/13/2020   No results found for: "IRON", "TIBC",  "FERRITIN"  Attestation Statements:   Reviewed by clinician on day of visit: allergies, medications, problem list, medical history, surgical history, family history, social history, and previous encounter notes.  I, Elnora Morrison, RMA am acting as transcriptionist for Coralie Common, MD.  I have reviewed the above documentation for accuracy and completeness, and I agree with the above. - Coralie Common, MD

## 2022-02-08 DIAGNOSIS — E785 Hyperlipidemia, unspecified: Secondary | ICD-10-CM | POA: Diagnosis not present

## 2022-02-08 DIAGNOSIS — I10 Essential (primary) hypertension: Secondary | ICD-10-CM | POA: Diagnosis not present

## 2022-02-08 DIAGNOSIS — F419 Anxiety disorder, unspecified: Secondary | ICD-10-CM | POA: Diagnosis not present

## 2022-02-08 DIAGNOSIS — E119 Type 2 diabetes mellitus without complications: Secondary | ICD-10-CM | POA: Diagnosis not present

## 2022-02-14 ENCOUNTER — Ambulatory Visit (INDEPENDENT_AMBULATORY_CARE_PROVIDER_SITE_OTHER): Payer: Federal, State, Local not specified - PPO | Admitting: Family Medicine

## 2022-02-14 ENCOUNTER — Encounter (INDEPENDENT_AMBULATORY_CARE_PROVIDER_SITE_OTHER): Payer: Self-pay | Admitting: Family Medicine

## 2022-02-14 VITALS — BP 148/79 | HR 71 | Temp 98.3°F | Ht 63.0 in | Wt 171.0 lb

## 2022-02-14 DIAGNOSIS — Z6841 Body Mass Index (BMI) 40.0 and over, adult: Secondary | ICD-10-CM

## 2022-02-14 DIAGNOSIS — E669 Obesity, unspecified: Secondary | ICD-10-CM | POA: Diagnosis not present

## 2022-02-14 DIAGNOSIS — I152 Hypertension secondary to endocrine disorders: Secondary | ICD-10-CM | POA: Diagnosis not present

## 2022-02-14 DIAGNOSIS — E1159 Type 2 diabetes mellitus with other circulatory complications: Secondary | ICD-10-CM

## 2022-02-14 DIAGNOSIS — Z7984 Long term (current) use of oral hypoglycemic drugs: Secondary | ICD-10-CM

## 2022-02-14 NOTE — Progress Notes (Deleted)
Patient established care with a new PCP (Dr. Vanessa [place]).  She was pleased with the care she received. She recognizes that she is not as compliant on meal plan as she was previously.  Dinner tends to be difficult to keep on plan.  She has been realizing she is getting tired of the way they were preparing food previously.  She mentions they have incorporated some of the higher protein foods.  Breakfast and lunch are not issues but dinner tends to be difficult.

## 2022-02-26 NOTE — Progress Notes (Signed)
Chief Complaint:   OBESITY Debbie Castro is here to discuss her progress with her obesity treatment plan along with follow-up of her obesity related diagnoses. Debbie Castro is on the Category 2 Plan and states she is following her eating plan approximately 50% of the time. [name] states she is walking 45 minutes 5-7 times per week.  Today's visit was #: 21 Starting weight: 226 lbs Starting date: 07/18/2020 Today's weight: 171 lbs Today's date: 02/14/2022 Total lbs lost to date: 55 lbs Total lbs lost since last in-office visit: 0  Interim History:  Patient established care with a new PCP (Dr. Vanessa [place]).  She was pleased with the care she received. She recognizes that she is not as compliant on meal plan as she was previously.  Dinner tends to be difficult to keep on plan.  She has been realizing she is getting tired of the way they were preparing food previously.  She mentions they have incorporated some of the higher protein foods.  Breakfast and lunch are not issues but dinner tends to be difficult.   Subjective:   1. Hypertension associated with diabetes ([place]) Blood pressure slightly elevated today.  Denies chest pain, chest pressure and headache.  Blood pressure at new PCP 122/78.  Now on Lisinopril 5 mg.  Assessment/Plan:   1. Hypertension associated with diabetes ([place]) Continue Lisinopril, follow up on blood pressure at next visit.  2. BMI 40.0-44.9, adult ([place])  3. Obesity with starting at 40.0 Debbie Castro is currently in the action stage of change. As such, her goal is to continue with weight loss efforts. She has agreed to the Category 2 Plan.   Exercise goals: As is.  Behavioral modification strategies: increasing vegetables, meal planning and cooking strategies, keeping healthy foods in the home, and planning for success.  [name] has agreed to follow-up with our clinic in 4 weeks. She was informed of the importance of frequent follow-up visits to maximize her success with intensive  lifestyle modifications for her multiple health conditions.   Objective:   Blood pressure (!) 148/79, pulse 71, temperature 98.3 F (36.8 C), height '5\' 3"'$  (1.6 m), weight 171 lb (77.6 kg), SpO2 98 %. Body mass index is 30.29 kg/m.  General: Cooperative, alert, well developed, in no acute distress. HEENT: Conjunctivae and lids unremarkable. Cardiovascular: Regular rhythm.  Lungs: Normal work of breathing. Neurologic: No focal deficits.   Lab Results  Component Value Date   CREATININE 0.7 12/18/2021   BUN 18 12/18/2021   NA 141 12/18/2021   K 4.2 12/18/2021   CL 104 12/18/2021   CO2 27 (A) 12/18/2021   Lab Results  Component Value Date   ALT 14 12/18/2021   AST 16 12/18/2021   ALKPHOS 75 06/18/2021   BILITOT 0.5 06/18/2021   Lab Results  Component Value Date   HGBA1C 5.4 12/18/2021   HGBA1C 5.5 06/18/2021   HGBA1C 5.5 12/13/2020   Lab Results  Component Value Date   INSULIN 7.2 06/18/2021   INSULIN 15.1 07/18/2020   Lab Results  Component Value Date   TSH 0.99 12/13/2020   Lab Results  Component Value Date   CHOL 89 12/18/2021   HDL 47 12/18/2021   LDLCALC 27 12/18/2021   TRIG 72 12/18/2021   Lab Results  Component Value Date   VD25OH 53.3 12/18/2021   VD25OH 72.1 06/18/2021   VD25OH 77.8 12/13/2020   Lab Results  Component Value Date   WBC 5.3 12/18/2021   HGB 13.1 12/18/2021   HCT  43 12/13/2020   HCT 43 12/13/2020   MCV 91 07/18/2020   PLT 241 12/13/2020   No results found for: "IRON", "TIBC", "FERRITIN"  Attestation Statements:   Reviewed by clinician on day of visit: allergies, medications, problem list, medical history, surgical history, family history, social history, and previous encounter notes.  I, Elnora Morrison, RMA am acting as transcriptionist for Coralie Common, MD.  I have reviewed the above documentation for accuracy and completeness, and I agree with the above. - Coralie Common, MD

## 2022-03-14 ENCOUNTER — Encounter (INDEPENDENT_AMBULATORY_CARE_PROVIDER_SITE_OTHER): Payer: Self-pay | Admitting: Family Medicine

## 2022-03-14 ENCOUNTER — Ambulatory Visit (INDEPENDENT_AMBULATORY_CARE_PROVIDER_SITE_OTHER): Payer: Federal, State, Local not specified - PPO | Admitting: Family Medicine

## 2022-03-14 VITALS — BP 134/80 | HR 71 | Temp 98.4°F | Ht 63.0 in | Wt 171.0 lb

## 2022-03-14 DIAGNOSIS — E1165 Type 2 diabetes mellitus with hyperglycemia: Secondary | ICD-10-CM

## 2022-03-14 DIAGNOSIS — E669 Obesity, unspecified: Secondary | ICD-10-CM | POA: Diagnosis not present

## 2022-03-14 DIAGNOSIS — E1159 Type 2 diabetes mellitus with other circulatory complications: Secondary | ICD-10-CM

## 2022-03-14 DIAGNOSIS — Z7985 Long-term (current) use of injectable non-insulin antidiabetic drugs: Secondary | ICD-10-CM

## 2022-03-14 DIAGNOSIS — Z7984 Long term (current) use of oral hypoglycemic drugs: Secondary | ICD-10-CM

## 2022-03-14 DIAGNOSIS — I152 Hypertension secondary to endocrine disorders: Secondary | ICD-10-CM | POA: Diagnosis not present

## 2022-03-14 DIAGNOSIS — Z6830 Body mass index (BMI) 30.0-30.9, adult: Secondary | ICD-10-CM

## 2022-03-14 MED ORDER — LISINOPRIL 5 MG PO TABS: 5.0000 mg | ORAL_TABLET | Freq: Every day | ORAL | 0 refills | Status: DC

## 2022-03-14 MED ORDER — METFORMIN HCL ER 500 MG PO TB24: 1000.0000 mg | ORAL_TABLET | Freq: Every day | ORAL | 0 refills | Status: DC

## 2022-03-14 MED ORDER — OZEMPIC (0.25 OR 0.5 MG/DOSE) 2 MG/1.5ML ~~LOC~~ SOPN: 0.2500 mg | PEN_INJECTOR | SUBCUTANEOUS | 0 refills | Status: DC

## 2022-03-14 NOTE — Progress Notes (Signed)
Chief Complaint:   OBESITY Debbie Castro is here to discuss her progress with her obesity treatment plan along with follow-up of her obesity related diagnoses. Debbie Castro is on the Category 2 Plan Debbie states she is following her eating plan approximately 50% of the time. Debbie Castro states she is going to the gym Debbie walking 45-60 minutes 5 times per week.  Today's visit was #: 22 Starting weight: 226 lbs Starting date: 07/18/2020 Today's weight: 171 lbs Today's date: 03/14/2022 Total lbs lost to date: 55 Total lbs lost since last in-office visit: 0  Interim History: This week patient went to [place] for work which was not planned.  She voices that her husband went with her Debbie brought food with them.  She tried hard to stick to plan.  She is traveling March 11-13 to [place].   Her husband is going with her to [place].  She has trips to [place] in the next few months as well.  Next few weeks she wants to stick to Cat 2 more strictly.  She feels she has done better in terms of compliance to meal plan. Her husband has been cooking more in line with the plan.  Subjective:   1. Hypertension associated with diabetes ([place]) BP well controlled today. Debbie Castro denies chest pain/chest pressure/headache.  2. Type 2 diabetes mellitus with hyperglycemia, without long-term current use of insulin (HCC) Debbie Castro's last A1c was 5.Castro with an insulin level of 7.2. She is on Metformin Debbie Ozempic.  Assessment/Plan:   1. Hypertension associated with diabetes ([place]) Debbie Castro is working on healthy weight Castro Debbie Debbie to improve blood pressure control. We will watch for signs of hypotension as she continues her lifestyle modifications. Continue current treatment plan.  Refill- lisinopril (ZESTRIL) 5 MG tablet; Take 1 tablet (5 mg total) by mouth daily.  Dispense: 90 tablet; Refill: 0  2. Type 2 diabetes mellitus with hyperglycemia, without long-term current use of insulin (HCC) Debbie blood sugar  control is important to decrease the likelihood of diabetic complications such as Castro, Debbie Castro, Debbie Castro, Debbie Castro, Debbie Castro, Debbie Castro, Debbie Debbie weight Castro are the first line of treatment for diabetes.  Continue current treatment plan.  Refill- metFORMIN (GLUCOPHAGE-XR) 500 MG 24 hr tablet; Take 2 tablets (1,000 mg total) by mouth daily.  Dispense: 180 tablet; Refill: 0 Refill- Semaglutide,0.25 or 0.'5MG'$ /DOS, (OZEMPIC, 0.25 OR 0.5 MG/DOSE,) 2 MG/1.5ML SOPN; Inject 0.25 mg into the skin once a week.  Dispense: 2 mL; Refill: 0  3. BMI 30.0-30.9,adult Castro. Obesity with starting at 40.0 Debbie Castro is currently in the action stage of change. As such, her goal is to continue with weight Castro efforts. She has agreed to the Category 2 Plan.   Debbie goals: All adults should avoid inactivity. Some physical activity is better than none, Debbie adults who participate in any amount of physical activity gain some health benefits.  Behavioral modification strategies: increasing lean protein intake, meal planning Debbie cooking strategies, keeping healthy foods in the home, Debbie planning for success.  Debbie Castro weeks. She was informed of the importance of frequent follow-up visits to maximize her success with intensive lifestyle modifications for her multiple health conditions.   Objective:   Blood pressure 134/80, pulse 71, temperature 98.Castro F (36.9 C), height '5\' 3"'$  (1.6 m), weight 171 lb (77.6 kg), SpO2 98 %. Body mass index is 30.29 kg/m.  General: Cooperative, alert, well  developed, in no acute distress. HEENT: Conjunctivae Debbie lids unremarkable. Cardiovascular: Regular rhythm.  Lungs: Normal work of breathing. Neurologic: No focal deficits.   Lab Results  Component Value Date   CREATININE 0.7 12/18/2021   BUN 18 12/18/2021   NA 141 12/18/2021   K Castro.2 12/18/2021   CL 104 12/18/2021    CO2 27 (A) 12/18/2021   Lab Results  Component Value Date   ALT 14 12/18/2021   AST 16 12/18/2021   ALKPHOS 75 06/18/2021   BILITOT 0.5 06/18/2021   Lab Results  Component Value Date   HGBA1C 5.Castro 12/18/2021   HGBA1C 5.5 06/18/2021   HGBA1C 5.5 12/13/2020   Lab Results  Component Value Date   INSULIN 7.2 06/18/2021   INSULIN 15.1 07/18/2020   Lab Results  Component Value Date   TSH 0.99 12/13/2020   Lab Results  Component Value Date   CHOL 89 12/18/2021   HDL 47 12/18/2021   LDLCALC 27 12/18/2021   TRIG 72 12/18/2021   Lab Results  Component Value Date   VD25OH 53.3 12/18/2021   VD25OH 72.1 06/18/2021   VD25OH 77.8 12/13/2020   Lab Results  Component Value Date   WBC 5.3 12/18/2021   HGB 13.1 12/18/2021   HCT 43 12/13/2020   HCT 43 12/13/2020   MCV 91 07/18/2020   PLT 241 12/13/2020     Attestation Statements:   Reviewed by clinician on day of visit: allergies, medications, problem list, medical history, surgical history, family history, social history, Debbie previous encounter notes.  I, Kathlene November, BS, CMA, am acting as transcriptionist for Coralie Common, MD.   I have reviewed the above documentation for accuracy Debbie completeness, Debbie I agree with the above. - Coralie Common, MD

## 2022-04-11 ENCOUNTER — Ambulatory Visit (INDEPENDENT_AMBULATORY_CARE_PROVIDER_SITE_OTHER): Payer: Federal, State, Local not specified - PPO | Admitting: Family Medicine

## 2022-05-06 ENCOUNTER — Ambulatory Visit (INDEPENDENT_AMBULATORY_CARE_PROVIDER_SITE_OTHER): Payer: Federal, State, Local not specified - PPO | Admitting: Family Medicine

## 2022-05-06 ENCOUNTER — Encounter (INDEPENDENT_AMBULATORY_CARE_PROVIDER_SITE_OTHER): Payer: Self-pay | Admitting: Family Medicine

## 2022-05-06 VITALS — BP 128/75 | HR 74 | Temp 98.4°F | Ht 63.0 in | Wt 176.0 lb

## 2022-05-06 DIAGNOSIS — E785 Hyperlipidemia, unspecified: Secondary | ICD-10-CM | POA: Diagnosis not present

## 2022-05-06 DIAGNOSIS — Z6831 Body mass index (BMI) 31.0-31.9, adult: Secondary | ICD-10-CM

## 2022-05-06 DIAGNOSIS — Z7985 Long-term (current) use of injectable non-insulin antidiabetic drugs: Secondary | ICD-10-CM

## 2022-05-06 DIAGNOSIS — E1165 Type 2 diabetes mellitus with hyperglycemia: Secondary | ICD-10-CM | POA: Diagnosis not present

## 2022-05-06 DIAGNOSIS — E669 Obesity, unspecified: Secondary | ICD-10-CM

## 2022-05-06 DIAGNOSIS — E1169 Type 2 diabetes mellitus with other specified complication: Secondary | ICD-10-CM

## 2022-05-06 MED ORDER — OZEMPIC (0.25 OR 0.5 MG/DOSE) 2 MG/3ML ~~LOC~~ SOPN: 0.2500 mg | PEN_INJECTOR | SUBCUTANEOUS | 0 refills | Status: DC

## 2022-05-06 NOTE — Progress Notes (Signed)
Chief Complaint:   OBESITY Debbie Castro is here to discuss her progress with her obesity treatment plan along with follow-up of her obesity related diagnoses. Debbie Castro is on the Category 2 Plan and states she is following her eating plan approximately 25% of the time. Debbie Castro states she is gym, walking her dog 45 minutes 7 days/week.  Today's visit was #: 23 Starting weight: 226 LBS Starting date: 07/18/2020 Today's weight: 176 LBS Today's date: 05/06/2022 Total lbs lost to date: 50 LBS Total lbs lost since last in-office visit: +5 LBS  Interim History: Patient has done some traveling since last appointment.  She recognizes that travel tends to be less strict than when she is at home.  She is aware that she is often protein light in terms of her total daily protein intake.  She is feeling slightly more burnt on food choices on the plan.   Subjective:   1. Type 2 diabetes mellitus with hyperglycemia, without long-term current use of insulin Patient is on Ozempic 0.25 mg weekly and metformin daily.  Patient's last A1c was 5.4.  2. Hyperlipidemia associated with type 2 diabetes mellitus Patient is on Crestor daily.  Patient's last LDL was 27.  Assessment/Plan:   1. Type 2 diabetes mellitus with hyperglycemia, without long-term current use of insulin Refill- Semaglutide,0.25 or 0.5MG /DOS, (OZEMPIC, 0.25 OR 0.5 MG/DOSE,) 2 MG/3ML SOPN; Inject 0.25 mg into the skin once a week.  Dispense: 3 mL; Refill: 0  2. Hyperlipidemia associated with type 2 diabetes mellitus Continue Crestor, no change in dose.  3. BMI 31.0-31.9,adult  4. Obesity with starting at 40.0 Debbie Castro is currently in the action stage of change. As such, her goal is to continue with weight loss efforts. She has agreed to the Category 2 Plan.   Exercise goals: All adults should avoid inactivity. Some physical activity is better than none, and adults who participate in any amount of physical activity gain some health  benefits.  Behavioral modification strategies: increasing lean protein intake, meal planning and cooking strategies, better snacking choices, and travel eating strategies.  Debbie Castro has agreed to follow-up with our clinic in 4-5 weeks. She was informed of the importance of frequent follow-up visits to maximize her success with intensive lifestyle modifications for her multiple health conditions.   Objective:   Blood pressure 128/75, pulse 74, temperature 98.4 F (36.9 C), height  (1.6 m), weight 176 lb (79.8 kg), SpO2 98 %. Body mass index is 31.18 kg/m.  General: Cooperative, alert, well developed, in no acute distress. HEENT: Conjunctivae and lids unremarkable. Cardiovascular: Regular rhythm.  Lungs: Normal work of breathing. Neurologic: No focal deficits.   Lab Results  Component Value Date   CREATININE 0.7 12/18/2021   BUN 18 12/18/2021   NA 141 12/18/2021   K 4.2 12/18/2021   CL 104 12/18/2021   CO2 27 (A) 12/18/2021   Lab Results  Component Value Date   ALT 14 12/18/2021   AST 16 12/18/2021   ALKPHOS 75 06/18/2021   BILITOT 0.5 06/18/2021   Lab Results  Component Value Date   HGBA1C 5.4 12/18/2021   HGBA1C 5.5 06/18/2021   HGBA1C 5.5 12/13/2020   Lab Results  Component Value Date   INSULIN 7.2 06/18/2021   INSULIN 15.1 07/18/2020   Lab Results  Component Value Date   TSH 0.99 12/13/2020   Lab Results  Component Value Date   CHOL 89 12/18/2021   HDL 47 12/18/2021   LDLCALC 27 12/18/2021  TRIG 72 12/18/2021   Lab Results  Component Value Date   VD25OH 53.3 12/18/2021   VD25OH 72.1 06/18/2021   VD25OH 77.8 12/13/2020   Lab Results  Component Value Date   WBC 5.3 12/18/2021   HGB 13.1 12/18/2021   HCT 43 12/13/2020   HCT 43 12/13/2020   MCV 91 07/18/2020   PLT 241 12/13/2020   No results found for: "IRON", "TIBC", "FERRITIN"  Attestation Statements:   Reviewed by clinician on day of visit: allergies, medications, problem list, medical  history, surgical history, family history, social history, and previous encounter notes.  I, Malcolm Metro, RMA, am acting as transcriptionist for [name], MD.  I have reviewed the above documentation for accuracy and completeness, and I agree with the above. - [name], MD

## 2022-05-20 NOTE — Progress Notes (Deleted)
62 y.o. G59P2012 Married Caucasian female here for annual exam.    PCP:     No LMP recorded. Patient has had a hysterectomy.           Sexually active: {yes no:314532}  The current method of family planning is status post hysterectomy.    Exercising: {yes no:314532}  {types:19826} Smoker:  no  Health Maintenance: Pap:  2005 normal History of abnormal Pap:  no MMG:  02/06/21 Breast Density Cat C, BI-RADS CAT 1 neg Colonoscopy:  04/11/21 BMD:   03/01/19  Result  normal TDaP:  PCP Gardasil:   no HIV: neg with PCP Hep C: neg with PCP Screening Labs:  Hb today: ***, Urine today: ***   reports that she has never smoked. She has never been exposed to tobacco smoke. She has never used smokeless tobacco. She reports that she does not currently use alcohol. She reports that she does not use drugs.  Past Medical History:  Diagnosis Date   Allergy    mold.seasonal   Anxiety    occasional   Asthma    Back pain    Bilateral swelling of feet and ankles    Constipation    Diabetes mellitus without complication (HCC)    prediabetic per PCP   HSV-1 infection    Fever blisters.   Hyperlipidemia    Hypertension    Joint pain    Kidney problem    Other fatigue    Prediabetes    Shortness of breath on exertion    Vitamin D deficiency     Past Surgical History:  Procedure Laterality Date   ANKLE SURGERY  01/15/1996   right   COLONOSCOPY     TOTAL ABDOMINAL HYSTERECTOMY  01/15/2003   still has ovaries   WISDOM TOOTH EXTRACTION  01/15/1980    Current Outpatient Medications  Medication Sig Dispense Refill   acyclovir (ZOVIRAX) 400 MG tablet Take 400 mg by mouth as needed.      ALPRAZolam (XANAX) 0.5 MG tablet Take 0.5 mg by mouth at bedtime as needed. Rarely takes for anxiety     cetirizine (ZYRTEC) 10 MG tablet Take 10 mg by mouth daily. allergies     cholecalciferol (VITAMIN D3) 25 MCG (1000 UNIT) tablet Take 2,000 Units by mouth daily.     CRANBERRY PO Take 1 tablet by mouth at  bedtime.     estradiol (ESTRACE) 0.1 MG/GM vaginal cream Use 1/2 g vaginally every night for the first 2 weeks, then use 1/2 g vaginally two or three times per week as needed to maintain symptom relief. 42.5 g 2   ezetimibe (ZETIA) 10 MG tablet Take 10 mg by mouth daily.     Insulin Pen Needle (BD PEN NEEDLE NANO 2ND GEN) 32G X 4 MM MISC 1 Package by Does not apply route 2 (two) times daily. 100 each 0   JUBLIA 10 % SOLN Apply topically daily.     lisinopril (ZESTRIL) 5 MG tablet Take 1 tablet (5 mg total) by mouth daily. 90 tablet 0   metFORMIN (GLUCOPHAGE-XR) 500 MG 24 hr tablet Take 2 tablets (1,000 mg total) by mouth daily. 180 tablet 0   naproxen sodium (ANAPROX) 220 MG tablet Take 440 mg by mouth as needed. Aches and pain     rosuvastatin (CRESTOR) 40 MG tablet Take 0.5 tablets (20 mg total) by mouth daily.     Semaglutide,0.25 or 0.5MG /DOS, (OZEMPIC, 0.25 OR 0.5 MG/DOSE,) 2 MG/3ML SOPN Inject 0.25 mg into the skin  once a week. 3 mL 0   No current facility-administered medications for this visit.    Family History  Problem Relation Age of Onset   Anxiety disorder Mother    Stroke Mother    Glaucoma Mother        loss of vision left eye   Anxiety disorder Father    Hyperlipidemia Father    Hypertension Father    Stroke Father    Cancer Maternal Grandfather    Anxiety disorder Other    Colon cancer Neg Hx    Esophageal cancer Neg Hx    Stomach cancer Neg Hx    Breast cancer Neg Hx    Colon polyps Neg Hx    Rectal cancer Neg Hx     Review of Systems  Exam:   There were no vitals taken for this visit.    General appearance: alert, cooperative and appears stated age Head: normocephalic, without obvious abnormality, atraumatic Neck: no adenopathy, supple, symmetrical, trachea midline and thyroid normal to inspection and palpation Lungs: clear to auscultation bilaterally Breasts: normal appearance, no masses or tenderness, No nipple retraction or dimpling, No nipple discharge  or bleeding, No axillary adenopathy Heart: regular rate and rhythm Abdomen: soft, non-tender; no masses, no organomegaly Extremities: extremities normal, atraumatic, no cyanosis or edema Skin: skin color, texture, turgor normal. No rashes or lesions Lymph nodes: cervical, supraclavicular, and axillary nodes normal. Neurologic: grossly normal  Pelvic: External genitalia:  no lesions              No abnormal inguinal nodes palpated.              Urethra:  normal appearing urethra with no masses, tenderness or lesions              Bartholins and Skenes: normal                 Vagina: normal appearing vagina with normal color and discharge, no lesions              Cervix: no lesions              Pap taken: {yes no:314532} Bimanual Exam:  Uterus:  normal size, contour, position, consistency, mobility, non-tender              Adnexa: no mass, fullness, tenderness              Rectal exam: {yes no:314532}.  Confirms.              Anus:  normal sphincter tone, no lesions  Chaperone was present for exam:  ***  Assessment:   Well woman visit with gynecologic exam.   Plan: Mammogram screening discussed. Self breast awareness reviewed. Pap and HR HPV as above. Guidelines for Calcium, Vitamin D, regular exercise program including cardiovascular and weight bearing exercise.   Follow up annually and prn.   Additional counseling given.  {yes [id number]. _______ minutes face to face time of which over 50% was spent in counseling.    After visit summary provided.    '

## 2022-05-29 ENCOUNTER — Ambulatory Visit: Payer: Federal, State, Local not specified - PPO | Admitting: Obstetrics and Gynecology

## 2022-06-03 ENCOUNTER — Ambulatory Visit: Payer: Federal, State, Local not specified - PPO | Admitting: Obstetrics and Gynecology

## 2022-06-04 ENCOUNTER — Encounter (INDEPENDENT_AMBULATORY_CARE_PROVIDER_SITE_OTHER): Payer: Self-pay | Admitting: Family Medicine

## 2022-06-04 ENCOUNTER — Ambulatory Visit (INDEPENDENT_AMBULATORY_CARE_PROVIDER_SITE_OTHER): Payer: Federal, State, Local not specified - PPO | Admitting: Family Medicine

## 2022-06-04 VITALS — BP 127/70 | HR 77 | Temp 98.1°F | Ht 63.0 in | Wt 179.0 lb

## 2022-06-04 DIAGNOSIS — Z7984 Long term (current) use of oral hypoglycemic drugs: Secondary | ICD-10-CM

## 2022-06-04 DIAGNOSIS — I152 Hypertension secondary to endocrine disorders: Secondary | ICD-10-CM | POA: Diagnosis not present

## 2022-06-04 DIAGNOSIS — Z6831 Body mass index (BMI) 31.0-31.9, adult: Secondary | ICD-10-CM

## 2022-06-04 DIAGNOSIS — E1165 Type 2 diabetes mellitus with hyperglycemia: Secondary | ICD-10-CM | POA: Diagnosis not present

## 2022-06-04 DIAGNOSIS — E669 Obesity, unspecified: Secondary | ICD-10-CM

## 2022-06-04 DIAGNOSIS — Z7985 Long-term (current) use of injectable non-insulin antidiabetic drugs: Secondary | ICD-10-CM

## 2022-06-04 DIAGNOSIS — E1159 Type 2 diabetes mellitus with other circulatory complications: Secondary | ICD-10-CM | POA: Diagnosis not present

## 2022-06-04 MED ORDER — OZEMPIC (0.25 OR 0.5 MG/DOSE) 2 MG/3ML ~~LOC~~ SOPN
0.5000 mg | PEN_INJECTOR | SUBCUTANEOUS | 0 refills | Status: DC
Start: 2022-06-04 — End: 2022-07-09

## 2022-06-04 MED ORDER — METFORMIN HCL ER 500 MG PO TB24: 1000.0000 mg | ORAL_TABLET | Freq: Every day | ORAL | 0 refills | Status: DC

## 2022-06-04 MED ORDER — LISINOPRIL 5 MG PO TABS: 5.0000 mg | ORAL_TABLET | Freq: Every day | ORAL | 0 refills | Status: DC

## 2022-06-04 NOTE — Progress Notes (Signed)
Chief Complaint:   OBESITY Debbie Castro is here to discuss her progress with her obesity treatment plan along with follow-up of her obesity related diagnoses. Debbie Castro is on the Category 2 Plan and states she is following her eating plan approximately 25% of the time. Debbie Castro states she is walking the dog 1 mile and at the gym for 45 minutes 5 times per week.  Today's visit was #: 24 Starting weight: 226 lbs Starting date: 07/18/2020 Today's weight: 179 lbs Today's date: 06/04/2022 Total lbs lost to date: 47 Total lbs lost since last in-office visit: 0  Interim History: Patient has been traveling every other week and has found it difficult to maintain plan.  She realizes she is really off plan.  She wants to really stop traveling.   Subjective:   1. Hypertension associated with diabetes (HCC) [name]'s blood pressure is well controlled. She is on Zestril 5 mg. She denies chest pain, chest pressure, or headache.   2. Type 2 diabetes mellitus with hyperglycemia, without long-term current use of insulin (HCC) Quyen is on metformin and Ozempic with no GI upset. Her last A1c was 5.4. she is noticing some decreased feeling of satiety at 0.25 mg dose of Ozempic.   Assessment/Plan:   1. Hypertension associated with diabetes (HCC) We will refill Zestril 5 mg for 90 days.   - lisinopril (ZESTRIL) 5 MG tablet; Take 1 tablet (5 mg total) by mouth daily.  Dispense: 90 tablet; Refill: 0  2. Type 2 diabetes mellitus with hyperglycemia, without long-term current use of insulin (HCC) Debbie Castro agreed to increase Ozempic to 0.5 mg once weekly, and we will refill for 1 month; we will refill metformin for 90 days.   - metFORMIN (GLUCOPHAGE-XR) 500 MG 24 hr tablet; Take 2 tablets (1,000 mg total) by mouth daily.  Dispense: 180 tablet; Refill: 0 - Semaglutide,0.25 or 0.5MG /DOS, (OZEMPIC, 0.25 OR 0.5 MG/DOSE,) 2 MG/3ML SOPN; Inject 0.5 mg into the skin once a week.  Dispense: 3 mL; Refill: 0  3. BMI  31.0-31.9,adult  4. Obesity with starting at 40.0 Debbie Castro is currently in the action stage of change. As such, her goal is to continue with weight loss efforts. She has agreed to the Category 2 Plan.   Exercise goals: As is.   Behavioral modification strategies: increasing lean protein intake, meal planning and cooking strategies, keeping healthy foods in the home, and planning for success.  [name] has agreed to follow-up with our clinic in 4 weeks. She was informed of the importance of frequent follow-up visits to maximize her success with intensive lifestyle modifications for her multiple health conditions.   Objective:   Blood pressure 127/70, pulse 77, temperature 98.1 F (36.7 C), height 5\' 3"  (1.6 m), weight 179 lb (81.2 kg), SpO2 97 %. Body mass index is 31.71 kg/m.  General: Cooperative, alert, well developed, in no acute distress. HEENT: Conjunctivae and lids unremarkable. Cardiovascular: Regular rhythm.  Lungs: Normal work of breathing. Neurologic: No focal deficits.   Lab Results  Component Value Date   CREATININE 0.7 12/18/2021   BUN 18 12/18/2021   NA 141 12/18/2021   K 4.2 12/18/2021   CL 104 12/18/2021   CO2 27 (A) 12/18/2021   Lab Results  Component Value Date   ALT 14 12/18/2021   AST 16 12/18/2021   ALKPHOS 75 06/18/2021   BILITOT 0.5 06/18/2021   Lab Results  Component Value Date   HGBA1C 5.4 12/18/2021   HGBA1C 5.5 06/18/2021   HGBA1C  5.5 12/13/2020   Lab Results  Component Value Date   INSULIN 7.2 06/18/2021   INSULIN 15.1 07/18/2020   Lab Results  Component Value Date   TSH 0.99 12/13/2020   Lab Results  Component Value Date   CHOL 89 12/18/2021   HDL 47 12/18/2021   LDLCALC 27 12/18/2021   TRIG 72 12/18/2021   Lab Results  Component Value Date   VD25OH 53.3 12/18/2021   VD25OH 72.1 06/18/2021   VD25OH 77.8 12/13/2020   Lab Results  Component Value Date   WBC 5.3 12/18/2021   HGB 13.1 12/18/2021   HCT 43 12/13/2020   HCT 43  12/13/2020   MCV 91 07/18/2020   PLT 241 12/13/2020   No results found for: "IRON", "TIBC", "FERRITIN"  Attestation Statements:   Reviewed by clinician on day of visit: allergies, medications, problem list, medical history, surgical history, family history, social history, and previous encounter notes.   I, Burt Knack, am acting as transcriptionist for [name], MD.  I have reviewed the above documentation for accuracy and completeness, and I agree with the above. - [name], MD

## 2022-06-11 ENCOUNTER — Other Ambulatory Visit: Payer: Self-pay | Admitting: Family Medicine

## 2022-06-11 DIAGNOSIS — Z0000 Encounter for general adult medical examination without abnormal findings: Secondary | ICD-10-CM

## 2022-06-12 DIAGNOSIS — M545 Low back pain, unspecified: Secondary | ICD-10-CM | POA: Diagnosis not present

## 2022-06-12 DIAGNOSIS — M79661 Pain in right lower leg: Secondary | ICD-10-CM | POA: Diagnosis not present

## 2022-06-21 ENCOUNTER — Ambulatory Visit: Payer: Federal, State, Local not specified - PPO | Admitting: Podiatry

## 2022-06-21 DIAGNOSIS — Q828 Other specified congenital malformations of skin: Secondary | ICD-10-CM | POA: Diagnosis not present

## 2022-06-21 NOTE — Progress Notes (Unsigned)
Subjective:   Patient ID: Debbie Castro, female   DOB: 62 y.o.   MRN: [id number]   HPI Chief Complaint  Patient presents with   Porokeratosis    Rm 13  Bilateral lesion x years. Pt states she has multple lesion that are irritated when they grow back . Pt has had her dermatologist debride them in the past but both thought it would be best of she sees a podiatrist.    62 year old female presents the office with above concerns.  No swelling, redness or any drainage that she reports.  No injuries. 3 on tirhgt 6 left  Review of Systems  All other systems reviewed and are negative.  Past Medical History:  Diagnosis Date   Allergy    mold.seasonal   Anxiety    occasional   Asthma    Back pain    Bilateral swelling of feet and ankles    Constipation    Diabetes mellitus without complication (HCC)    prediabetic per PCP   HSV-1 infection    Fever blisters.   Hyperlipidemia    Hypertension    Joint pain    Kidney problem    Other fatigue    Prediabetes    Shortness of breath on exertion    Vitamin D deficiency     Past Surgical History:  Procedure Laterality Date   ANKLE SURGERY  01/15/1996   right   COLONOSCOPY     TOTAL ABDOMINAL HYSTERECTOMY  01/15/2003   still has ovaries   WISDOM TOOTH EXTRACTION  01/15/1980     Current Outpatient Medications:    acyclovir (ZOVIRAX) 400 MG tablet, Take 400 mg by mouth as needed. , Disp: , Rfl:    ALPRAZolam (XANAX) 0.5 MG tablet, Take 0.5 mg by mouth at bedtime as needed. Rarely takes for anxiety, Disp: , Rfl:    cetirizine (ZYRTEC) 10 MG tablet, Take 10 mg by mouth daily. allergies, Disp: , Rfl:    cholecalciferol (VITAMIN D3) 25 MCG (1000 UNIT) tablet, Take 2,000 Units by mouth daily., Disp: , Rfl:    CRANBERRY PO, Take 1 tablet by mouth at bedtime., Disp: , Rfl:    estradiol (ESTRACE) 0.1 MG/GM vaginal cream, Use 1/2 g vaginally every night for the first 2 weeks, then use 1/2 g vaginally two or three times per week as needed to  maintain symptom relief., Disp: 42.5 g, Rfl: 2   ezetimibe (ZETIA) 10 MG tablet, Take 10 mg by mouth daily., Disp: , Rfl:    Insulin Pen Needle (BD PEN NEEDLE NANO 2ND GEN) 32G X 4 MM MISC, 1 Package by Does not apply route 2 (two) times daily., Disp: 100 each, Rfl: 0   JUBLIA 10 % SOLN, Apply topically daily., Disp: , Rfl:    lisinopril (ZESTRIL) 5 MG tablet, Take 1 tablet (5 mg total) by mouth daily., Disp: 90 tablet, Rfl: 0   metFORMIN (GLUCOPHAGE-XR) 500 MG 24 hr tablet, Take 2 tablets (1,000 mg total) by mouth daily., Disp: 180 tablet, Rfl: 0   naproxen sodium (ANAPROX) 220 MG tablet, Take 440 mg by mouth as needed. Aches and pain, Disp: , Rfl:    rosuvastatin (CRESTOR) 40 MG tablet, Take 0.5 tablets (20 mg total) by mouth daily. (Patient taking differently: Take 40 mg by mouth daily.), Disp: , Rfl:    Semaglutide,0.25 or 0.5MG /DOS, (OZEMPIC, 0.25 OR 0.5 MG/DOSE,) 2 MG/3ML SOPN, Inject 0.5 mg into the skin once a week., Disp: 3 mL, Rfl: 0  Allergies  Allergen  Reactions   Codeine Nausea And Vomiting and Other (See Comments)    Shaking   Penicillins Hives and Swelling          Objective:  Physical Exam  General: AAO x3, NAD  Dermatological: There are multiple punctate annular hyperkeratotic lesions with 3 on the right and 6 on the left.  Upon debridement there is no pinpoint bleeding, evidence of verruca.  No edema no erythema.  Vascular: Dorsalis Pedis artery and Posterior Tibial artery pedal pulses are 2/4 bilateral with immedate capillary fill time. There is no pain with calf compression, swelling, warmth, erythema.   Neruologic: Grossly intact via light touch bilateral.   Musculoskeletal: She has tenderness palpation of the skin lesions but no other areas of discomfort.  Gait: Unassisted, Nonantalgic.       Assessment:   Skin lesion bilaterally     Plan:  -Treatment options discussed including all alternatives, risks, and complications -Etiology of symptoms were  discussed -Sharply debrided the lesions x 9 without any complications or bleeding.  I cleaned the skin with alcohol and Cantharone Plus was applied followed by an occlusive bandage.  Postprocedure instructions discussed.  Monitor for any signs or symptoms of infection.  Vivi Barrack DPM

## 2022-06-21 NOTE — Patient Instructions (Signed)
Take dressing off in 8 hours and wash the foot with soap and water. If it is hurting or becomes uncomfortable before the 8 hours, go ahead and remove the bandage and wash the area.  If it blisters, apply antibiotic ointment and a band-aid.  Monitor for any signs/symptoms of infection. Call the office immediately if any occur or go directly to the emergency room. Call with any questions/concerns.    Soak Instructions  If needed   THE DAY AFTER THE PROCEDURE  Place 1/4 cup of epsom salts in a quart of warm tap water.  Submerge your foot or feet with outer bandage intact for the initial soak; this will allow the bandage to become moist and wet for easy lift off.  Once you remove your bandage, continue to soak in the solution for 20 minutes.  This soak should be done twice a day.  Next, remove your foot or feet from solution, blot dry the affected area and cover.  You may use a band aid large enough to cover the area or use gauze and tape.  Apply other medications to the area as directed by the doctor such as polysporin neosporin.  IF YOUR SKIN BECOMES IRRITATED WHILE USING THESE INSTRUCTIONS, IT IS OKAY TO SWITCH TO  WHITE VINEGAR AND WATER. Or you may use antibacterial soap and water to keep the toe clean  Monitor for any signs/symptoms of infection. Call the office immediately if any occur or go directly to the emergency room. Call with any questions/concerns.

## 2022-06-28 ENCOUNTER — Ambulatory Visit: Payer: Federal, State, Local not specified - PPO

## 2022-07-09 ENCOUNTER — Ambulatory Visit (INDEPENDENT_AMBULATORY_CARE_PROVIDER_SITE_OTHER): Payer: Federal, State, Local not specified - PPO | Admitting: Family Medicine

## 2022-07-09 ENCOUNTER — Encounter (INDEPENDENT_AMBULATORY_CARE_PROVIDER_SITE_OTHER): Payer: Self-pay | Admitting: Family Medicine

## 2022-07-09 VITALS — BP 131/76 | HR 77 | Temp 98.1°F | Ht 63.0 in | Wt 179.0 lb

## 2022-07-09 DIAGNOSIS — E669 Obesity, unspecified: Secondary | ICD-10-CM | POA: Diagnosis not present

## 2022-07-09 DIAGNOSIS — Z6831 Body mass index (BMI) 31.0-31.9, adult: Secondary | ICD-10-CM

## 2022-07-09 DIAGNOSIS — Z7985 Long-term (current) use of injectable non-insulin antidiabetic drugs: Secondary | ICD-10-CM

## 2022-07-09 DIAGNOSIS — E1165 Type 2 diabetes mellitus with hyperglycemia: Secondary | ICD-10-CM | POA: Diagnosis not present

## 2022-07-09 DIAGNOSIS — E1159 Type 2 diabetes mellitus with other circulatory complications: Secondary | ICD-10-CM | POA: Diagnosis not present

## 2022-07-09 DIAGNOSIS — I152 Hypertension secondary to endocrine disorders: Secondary | ICD-10-CM | POA: Diagnosis not present

## 2022-07-09 MED ORDER — OZEMPIC (0.25 OR 0.5 MG/DOSE) 2 MG/3ML ~~LOC~~ SOPN: 0.5000 mg | PEN_INJECTOR | SUBCUTANEOUS | 0 refills | Status: DC

## 2022-07-09 NOTE — Progress Notes (Unsigned)
Chief Complaint:   OBESITY Debbie Castro is here to discuss her progress with her obesity treatment plan along with follow-up of her obesity related diagnoses. Debbie Castro is on the Category 2 Plan and states she is following her eating plan approximately 50% of the time. Debbie Castro states she is at the gym for 45-60 minutes 5 times per week.  Today's visit was #: 25 Starting weight: 226 lbs Starting date: 07/18/2020 Today's weight: 179 lbs Today's date: 07/09/2022 Total lbs lost to date: 47 Total lbs lost since last in-office visit: 0  Interim History: Patient reports she isn't following as plan as much as she would like.  She feels like she is doing well in terms of maintenance but isn't able to get back into the loss.  The snacks she is eating are likely more saboteurs. She mentions she gets tired of the protein bars she has from [employer].  She is doing the [place] sandwich and apple for lunch.  She occasionally does the microwave meal option for lunch.  She is occasionally overdoing it on snacks. Next few weeks she doesn't have much planned- she doesn't have anything planned for July 4th.  This past week she watched her grandkids and brought them to swim lessons and stayed at the pool with them.  Subjective:   1. Type 2 diabetes mellitus with hyperglycemia, without long-term current use of insulin (HCC) Patient's last A1c was 5.4. she denies GI side effects with increased of Ozempic.   2. Hypertension associated with diabetes (HCC) Patient's blood pressure is within normal limits today. She is on lisinopril 5 mg daily. She denies chest pain, chest pressure, or headache.   Assessment/Plan:   1. Type 2 diabetes mellitus with hyperglycemia, without long-term current use of insulin (HCC) We will refill Ozempic 0.5 mg once weekly with no refills.  We will discuss change in dose at her next appointment pending hunger after increased meal plan.  - Semaglutide,0.25 or 0.5MG /DOS, (OZEMPIC, 0.25 OR 0.5  MG/DOSE,) 2 MG/3ML SOPN; Inject 0.5 mg into the skin once a week.  Dispense: 3 mL; Refill: 0  2. Hypertension associated with diabetes [facility]) Patient will continue her current medications at the same dose.   3. BMI 31.0-31.9,adult  4. Obesity with starting at 40.0 Debbie Castro is currently in the action stage of change. As such, her goal is to continue with weight loss efforts. She has agreed to the Category 2 Plan.   Exercise goals: All adults should avoid inactivity. Some physical activity is better than none, and adults who participate in any amount of physical activity gain some health benefits.  Behavioral modification strategies: increasing lean protein intake, meal planning and cooking strategies, keeping healthy foods in the home, and planning for success.  Debbie Castro has agreed to follow-up with our clinic in 4 weeks. She was informed of the importance of frequent follow-up visits to maximize her success with intensive lifestyle modifications for her multiple health conditions.   Objective:   Blood pressure 131/76, pulse 77, temperature 98.1 F (36.7 C), height 5\' 3"  (1.6 m), weight 179 lb (81.2 kg), SpO2 98 %. Body mass index is 31.71 kg/m.  General: Cooperative, alert, well developed, in no acute distress. HEENT: Conjunctivae and lids unremarkable. Cardiovascular: Regular rhythm.  Lungs: Normal work of breathing. Neurologic: No focal deficits.   Lab Results  Component Value Date   CREATININE 0.7 12/18/2021   BUN 18 12/18/2021   NA 141 12/18/2021   K 4.2 12/18/2021   CL 104  12/18/2021   CO2 27 (A) 12/18/2021   Lab Results  Component Value Date   ALT 14 12/18/2021   AST 16 12/18/2021   ALKPHOS 75 06/18/2021   BILITOT 0.5 06/18/2021   Lab Results  Component Value Date   HGBA1C 5.4 12/18/2021   HGBA1C 5.5 06/18/2021   HGBA1C 5.5 12/13/2020   Lab Results  Component Value Date   INSULIN 7.2 06/18/2021   INSULIN 15.1 07/18/2020   Lab Results  Component Value Date    TSH 0.99 12/13/2020   Lab Results  Component Value Date   CHOL 89 12/18/2021   HDL 47 12/18/2021   LDLCALC 27 12/18/2021   TRIG 72 12/18/2021   Lab Results  Component Value Date   VD25OH 53.3 12/18/2021   VD25OH 72.1 06/18/2021   VD25OH 77.8 12/13/2020   Lab Results  Component Value Date   WBC 5.3 12/18/2021   HGB 13.1 12/18/2021   HCT 43 12/13/2020   HCT 43 12/13/2020   MCV 91 07/18/2020   PLT 241 12/13/2020   No results found for: "IRON", "TIBC", "FERRITIN"  Attestation Statements:   Reviewed by clinician on day of visit: allergies, medications, problem list, medical history, surgical history, family history, social history, and previous encounter notes.   I, Burt Knack, am acting as transcriptionist for [name], MD.  I have reviewed the above documentation for accuracy and completeness, and I agree with the above. - [name], MD

## 2022-07-23 DIAGNOSIS — I10 Essential (primary) hypertension: Secondary | ICD-10-CM | POA: Diagnosis not present

## 2022-07-23 DIAGNOSIS — E119 Type 2 diabetes mellitus without complications: Secondary | ICD-10-CM | POA: Diagnosis not present

## 2022-07-23 DIAGNOSIS — E785 Hyperlipidemia, unspecified: Secondary | ICD-10-CM | POA: Diagnosis not present

## 2022-07-23 LAB — BASIC METABOLIC PANEL
BUN: 23 — AB (ref 4–21)
CO2: 30 — AB (ref 13–22)
Chloride: 105 (ref 99–108)
Creatinine: 0.7 (ref 0.5–1.1)
Glucose: 101
Potassium: 4.2 meq/L (ref 3.5–5.1)
Sodium: 140 (ref 137–147)

## 2022-07-23 LAB — COMPREHENSIVE METABOLIC PANEL
Albumin: 4.1 (ref 3.5–5.0)
Calcium: 9.1 (ref 8.7–10.7)
eGFR: 97

## 2022-07-23 LAB — LIPID PANEL
Cholesterol: 89 (ref 0–200)
HDL: 40 (ref 35–70)
LDL Cholesterol: 34
Triglycerides: 70 (ref 40–160)

## 2022-07-23 LAB — HEMOGLOBIN A1C: Hemoglobin A1C: 6

## 2022-07-23 LAB — MICROALBUMIN, URINE: Microalb, Ur: 1.19

## 2022-07-23 LAB — HEPATIC FUNCTION PANEL
ALT: 36 U/L — AB (ref 7–35)
AST: 23 (ref 13–35)
Alkaline Phosphatase: 55 (ref 25–125)
Bilirubin, Total: 0.4

## 2022-07-23 LAB — PROTEIN / CREATININE RATIO, URINE: Creatinine, Urine: 96

## 2022-08-05 ENCOUNTER — Encounter: Payer: Self-pay | Admitting: Podiatry

## 2022-08-05 ENCOUNTER — Ambulatory Visit: Payer: Federal, State, Local not specified - PPO | Admitting: Podiatry

## 2022-08-05 VITALS — BP 152/80 | HR 68

## 2022-08-05 DIAGNOSIS — Q828 Other specified congenital malformations of skin: Secondary | ICD-10-CM | POA: Diagnosis not present

## 2022-08-07 ENCOUNTER — Encounter (INDEPENDENT_AMBULATORY_CARE_PROVIDER_SITE_OTHER): Payer: Self-pay | Admitting: Family Medicine

## 2022-08-07 ENCOUNTER — Ambulatory Visit (INDEPENDENT_AMBULATORY_CARE_PROVIDER_SITE_OTHER): Payer: Federal, State, Local not specified - PPO | Admitting: Family Medicine

## 2022-08-07 VITALS — BP 111/73 | HR 76 | Temp 97.9°F | Ht 63.0 in | Wt 177.0 lb

## 2022-08-07 DIAGNOSIS — E669 Obesity, unspecified: Secondary | ICD-10-CM

## 2022-08-07 DIAGNOSIS — E785 Hyperlipidemia, unspecified: Secondary | ICD-10-CM

## 2022-08-07 DIAGNOSIS — Z7985 Long-term (current) use of injectable non-insulin antidiabetic drugs: Secondary | ICD-10-CM

## 2022-08-07 DIAGNOSIS — E1169 Type 2 diabetes mellitus with other specified complication: Secondary | ICD-10-CM | POA: Diagnosis not present

## 2022-08-07 DIAGNOSIS — E1165 Type 2 diabetes mellitus with hyperglycemia: Secondary | ICD-10-CM

## 2022-08-07 DIAGNOSIS — R0602 Shortness of breath: Secondary | ICD-10-CM | POA: Diagnosis not present

## 2022-08-07 DIAGNOSIS — Z6831 Body mass index (BMI) 31.0-31.9, adult: Secondary | ICD-10-CM

## 2022-08-07 MED ORDER — OZEMPIC (0.25 OR 0.5 MG/DOSE) 2 MG/3ML ~~LOC~~ SOPN: 0.5000 mg | PEN_INJECTOR | SUBCUTANEOUS | 0 refills | Status: DC

## 2022-08-07 NOTE — Progress Notes (Signed)
Chief Complaint:   OBESITY Debbie Castro is here to discuss her progress with her obesity treatment plan along with follow-up of her obesity related diagnoses. Debbie Castro is on the Category 2 Plan and states she is following her eating plan approximately 50% of the time. Debbie Castro states she is at the gym for 45 minutes 5 times per week.  Today's visit was #: 26 Starting weight: 226 lbs Starting date: 07/18/2020 Today's weight: 177 lbs Today's date: 08/07/2022 Total lbs lost to date: 49 Total lbs lost since last in-office visit: 2  Interim History: Patient went to the beach with her grandkids and husband last week and ate out every meal except lunch.  She mentions this was a fantastic trip. Patient is hoping that without already scheduled plans and activities she will try to stick to Category plan more consistently.   Subjective:   1. SOBOE (shortness of breath on exertion) Patient's initial IC from July 2022 was of 1627. She reports her symptoms have somewhat improved since that time.   2. Type 2 diabetes mellitus with hyperglycemia, without long-term current use of insulin (HCC) Patient's recent labs with her PCP was showing A1c of 6.0.  She is on Ozempic 0.5 mg.  3. Hyperlipidemia associated with type 2 diabetes mellitus (HCC) Patient is on Crestor.  Her LDL with her PCP was in the 30s, triglycerides controlled, and HDL low.  Patient stopped Zetia.  Assessment/Plan:   1. SOBOE (shortness of breath on exertion) Patient's IC today is with RMR of 1440. She may continue her Category 2 plan.   2. Type 2 diabetes mellitus with hyperglycemia, without long-term current use of insulin (HCC) Patient will continue Ozempic at 0.5 mg, and we will refill for 1 month.  - Semaglutide,0.25 or 0.5MG /DOS, (OZEMPIC, 0.25 OR 0.5 MG/DOSE,) 2 MG/3ML SOPN; Inject 0.5 mg into the skin once a week.  Dispense: 3 mL; Refill: 0  3. Hyperlipidemia associated with type 2 diabetes mellitus (HCC) We will repeat labs  in 4 months.  Patient was encouraged to increase vigorousness of resistance training.  4. BMI 31.0-31.9,adult  5. Obesity with starting at 40.0 Debbie Castro is currently in the action stage of change. As such, her goal is to continue with weight loss efforts. She has agreed to the Category 2 Plan.   Exercise goals: As is.   Behavioral modification strategies: increasing lean protein intake, meal planning and cooking strategies, keeping healthy foods in the home, and planning for success.  [name] has agreed to follow-up with our clinic in 4 weeks. She was informed of the importance of frequent follow-up visits to maximize her success with intensive lifestyle modifications for her multiple health conditions.   Objective:   Blood pressure 111/73, pulse 76, temperature 97.9 F (36.6 C), height 5\' 3"  (1.6 m), weight 177 lb (80.3 kg), SpO2 99%. Body mass index is 31.35 kg/m.  General: Cooperative, alert, well developed, in no acute distress. HEENT: Conjunctivae and lids unremarkable. Cardiovascular: Regular rhythm.  Lungs: Normal work of breathing. Neurologic: No focal deficits.   Lab Results  Component Value Date   CREATININE 0.7 12/18/2021   BUN 18 12/18/2021   NA 141 12/18/2021   K 4.2 12/18/2021   CL 104 12/18/2021   CO2 27 (A) 12/18/2021   Lab Results  Component Value Date   ALT 14 12/18/2021   AST 16 12/18/2021   ALKPHOS 75 06/18/2021   BILITOT 0.5 06/18/2021   Lab Results  Component Value Date   HGBA1C 5.4  12/18/2021   HGBA1C 5.5 06/18/2021   HGBA1C 5.5 12/13/2020   Lab Results  Component Value Date   INSULIN 7.2 06/18/2021   INSULIN 15.1 07/18/2020   Lab Results  Component Value Date   TSH 0.99 12/13/2020   Lab Results  Component Value Date   CHOL 89 12/18/2021   HDL 47 12/18/2021   LDLCALC 27 12/18/2021   TRIG 72 12/18/2021   Lab Results  Component Value Date   VD25OH 53.3 12/18/2021   VD25OH 72.1 06/18/2021   VD25OH 77.8 12/13/2020   Lab Results   Component Value Date   WBC 5.3 12/18/2021   HGB 13.1 12/18/2021   HCT 43 12/13/2020   HCT 43 12/13/2020   MCV 91 07/18/2020   PLT 241 12/13/2020   No results found for: "IRON", "TIBC", "FERRITIN"  Attestation Statements:   Reviewed by clinician on day of visit: allergies, medications, problem list, medical history, surgical history, family history, social history, and previous encounter notes.   I, Burt Knack, am acting as transcriptionist for [name], MD.  I have reviewed the above documentation for accuracy and completeness, and I agree with the above. - [name], MD

## 2022-08-08 ENCOUNTER — Ambulatory Visit
Admission: RE | Admit: 2022-08-08 | Discharge: 2022-08-08 | Disposition: A | Discharge status: Home / Self Care | Payer: Federal, State, Local not specified - PPO | Source: Ambulatory Visit | Attending: Family Medicine | Admitting: Family Medicine

## 2022-08-08 DIAGNOSIS — Z1231 Encounter for screening mammogram for malignant neoplasm of breast: Secondary | ICD-10-CM | POA: Diagnosis not present

## 2022-08-08 DIAGNOSIS — Z0000 Encounter for general adult medical examination without abnormal findings: Secondary | ICD-10-CM

## 2022-08-11 NOTE — Progress Notes (Signed)
Subjective: Chief Complaint  Patient presents with   Callouses    "It's doing okay, there's still a lump on the left foot that I feel.  The pain is gone since he takes those things out."   62 year old female presents for above concerns.  Overall doing better.  No swelling redness or drainage.  No open lesions.  Objective: AAO x3, NAD DP/PT pulses palpable bilaterally, CRT less than 3 seconds Contorted spots have resolved and is new,.  Skin present.  There are 11 lesions today.  There is no edema, erythema or signs of infection. No pain with calf compression, swelling, warmth, erythema  Assessment: Skin lesions bilaterally with improvement  Plan: -All treatment options discussed with the patient including all alternatives, risks, complications.  -Overall doing better.  Recommend moisturizer.  Needs echo order medication to apply topically or reapply the Cantharone. -Patient encouraged to call the office with any questions, concerns, change in symptoms.   No follow-ups on file.  Vivi Barrack DPM

## 2022-08-13 ENCOUNTER — Other Ambulatory Visit: Payer: Self-pay | Admitting: Family Medicine

## 2022-08-13 DIAGNOSIS — R928 Other abnormal and inconclusive findings on diagnostic imaging of breast: Secondary | ICD-10-CM

## 2022-08-20 ENCOUNTER — Other Ambulatory Visit: Payer: Federal, State, Local not specified - PPO

## 2022-08-23 ENCOUNTER — Ambulatory Visit
Admission: RE | Admit: 2022-08-23 | Discharge: 2022-08-23 | Disposition: A | Discharge status: Home / Self Care | Payer: Federal, State, Local not specified - PPO | Source: Ambulatory Visit | Attending: Family Medicine | Admitting: Family Medicine

## 2022-08-23 ENCOUNTER — Ambulatory Visit: Admission: RE | Admit: 2022-08-23 | Payer: Federal, State, Local not specified - PPO | Source: Ambulatory Visit

## 2022-08-23 DIAGNOSIS — R928 Other abnormal and inconclusive findings on diagnostic imaging of breast: Secondary | ICD-10-CM

## 2022-08-27 NOTE — Progress Notes (Signed)
62 y.o. G34P2012 Married Caucasian female here for annual exam.    Using vaginal estradiol cream once a week. Asking about Vagifem tablets.   PCP:   Jackelyn Poling, DO  No LMP recorded. Patient has had a hysterectomy.           Sexually active: Yes.    The current method of family planning is status post hysterectomy.    Exercising: Yes.     Gym 5x a week Smoker:  no  Health Maintenance: Pap:  2005 normal History of abnormal Pap:  no MMG:  08/23/22 Breast Density Cat C, BI-RADS CAT 1 neg.   Colonoscopy:  04/11/21 - due in 2028 BMD:   03/01/19  Result  WNL TDaP:  PCP Gardasil:   no HIV: neg with PCP Hep C: neg with PCP Screening Labs:  PCP   reports that she has never smoked. She has never been exposed to tobacco smoke. She has never used smokeless tobacco. She reports current alcohol use. She reports that she does not use drugs.  Past Medical History:  Diagnosis Date   Allergy    mold.seasonal   Anxiety    occasional   Asthma    Back pain    Bilateral swelling of feet and ankles    Constipation    Diabetes mellitus without complication (HCC)    prediabetic per PCP   HSV-1 infection    Fever blisters.   Hyperlipidemia    Hypertension    Joint pain    Kidney problem    Other fatigue    Prediabetes    Shortness of breath on exertion    Vitamin D deficiency     Past Surgical History:  Procedure Laterality Date   ANKLE SURGERY  01/15/1996   right   COLONOSCOPY     TOTAL ABDOMINAL HYSTERECTOMY  01/15/2003   still has ovaries   WISDOM TOOTH EXTRACTION  01/15/1980    Current Outpatient Medications  Medication Sig Dispense Refill   acyclovir (ZOVIRAX) 400 MG tablet Take 400 mg by mouth as needed.      ALPRAZolam (XANAX) 0.5 MG tablet Take 0.5 mg by mouth at bedtime as needed. Rarely takes for anxiety     cetirizine (ZYRTEC) 10 MG tablet Take 10 mg by mouth daily. allergies     cholecalciferol (VITAMIN D3) 25 MCG (1000 UNIT) tablet Take 2,000 Units by mouth daily.      CRANBERRY PO Take 1 tablet by mouth at bedtime.     estradiol (ESTRACE) 0.1 MG/GM vaginal cream Use 1/2 g vaginally every night for the first 2 weeks, then use 1/2 g vaginally two or three times per week as needed to maintain symptom relief. 42.5 g 2   Insulin Pen Needle (BD PEN NEEDLE NANO 2ND GEN) 32G X 4 MM MISC 1 Package by Does not apply route 2 (two) times daily. 100 each 0   JUBLIA 10 % SOLN Apply topically daily.     lisinopril (ZESTRIL) 5 MG tablet Take 1 tablet (5 mg total) by mouth daily. 90 tablet 0   metFORMIN (GLUCOPHAGE-XR) 500 MG 24 hr tablet Take 2 tablets (1,000 mg total) by mouth daily. 180 tablet 0   naproxen sodium (ANAPROX) 220 MG tablet Take 440 mg by mouth as needed. Aches and pain     rosuvastatin (CRESTOR) 40 MG tablet Take 0.5 tablets (20 mg total) by mouth daily. (Patient taking differently: Take 40 mg by mouth daily.)     Semaglutide,0.25 or 0.5MG /DOS, (OZEMPIC, 0.25  OR 0.5 MG/DOSE,) 2 MG/3ML SOPN Inject 0.5 mg into the skin once a week. 3 mL 0   Fluorouracil POWD  (Patient not taking: Reported on 09/10/2022)     No current facility-administered medications for this visit.    Family History  Problem Relation Age of Onset   Anxiety disorder Mother    Stroke Mother    Glaucoma Mother        loss of vision left eye   Anxiety disorder Father    Hyperlipidemia Father    Hypertension Father    Stroke Father    Cancer Maternal Grandfather    Anxiety disorder Other    Colon cancer Neg Hx    Esophageal cancer Neg Hx    Stomach cancer Neg Hx    Breast cancer Neg Hx    Colon polyps Neg Hx    Rectal cancer Neg Hx     Review of Systems  All other systems reviewed and are negative.   Exam:   BP 128/84 (BP Location: Left Arm, Patient Position: Sitting, Cuff Size: Normal)   Pulse 70   Ht 5' 2.5" (1.588 m)   Wt 185 lb (83.9 kg)   SpO2 98%   BMI 33.30 kg/m     General appearance: alert, cooperative and appears stated age Head: normocephalic, without obvious  abnormality, atraumatic Neck: no adenopathy, supple, symmetrical, trachea midline and thyroid normal to inspection and palpation Lungs: clear to auscultation bilaterally Breasts: normal appearance, no masses or tenderness, No nipple retraction or dimpling, No nipple discharge or bleeding, No axillary adenopathy Heart: regular rate and rhythm Abdomen: soft, non-tender; no masses, no organomegaly Extremities: extremities normal, atraumatic, no cyanosis or edema Skin: skin color, texture, turgor normal. No rashes or lesions Lymph nodes: cervical, supraclavicular, and axillary nodes normal. Neurologic: grossly normal  Pelvic: External genitalia:  no lesions              No abnormal inguinal nodes palpated.              Urethra:  normal appearing urethra with no masses, tenderness or lesions              Bartholins and Skenes: normal                 Vagina: normal appearing vagina with normal color and discharge, no lesions              Cervix:  absent              Pap taken: no Bimanual Exam:  Uterus: absent              Adnexa: no mass, fullness, tenderness              Rectal exam: declined.    Chaperone was present for exam:  yes.  Assessment:   Well woman visit with gynecologic exam. Status post TAH.  Cervix was removed.  Ovaries remain.  Vaginal atrophy.  Hx HSV 1.   Plan: Mammogram screening discussed. Self breast awareness reviewed. Pap and HR HPV not indicated.  Guidelines for Calcium, Vitamin D, regular exercise program including cardiovascular and weight bearing exercise. We discussed vaginal estrogen cream versus tablets.   Refill of vaginal Estrace cream.  Potential effect on breast cancer discussed.   Follow up annually and prn.

## 2022-09-05 ENCOUNTER — Ambulatory Visit (INDEPENDENT_AMBULATORY_CARE_PROVIDER_SITE_OTHER): Payer: Federal, State, Local not specified - PPO | Admitting: Family Medicine

## 2022-09-10 ENCOUNTER — Ambulatory Visit (INDEPENDENT_AMBULATORY_CARE_PROVIDER_SITE_OTHER): Payer: Federal, State, Local not specified - PPO | Admitting: Obstetrics and Gynecology

## 2022-09-10 ENCOUNTER — Encounter: Payer: Self-pay | Admitting: Obstetrics and Gynecology

## 2022-09-10 VITALS — BP 128/84 | HR 70 | Ht 62.5 in | Wt 185.0 lb

## 2022-09-10 DIAGNOSIS — Z01419 Encounter for gynecological examination (general) (routine) without abnormal findings: Secondary | ICD-10-CM

## 2022-09-10 MED ORDER — ESTRADIOL 0.1 MG/GM VA CREA: TOPICAL_CREAM | VAGINAL | 2 refills | Status: DC

## 2022-09-10 NOTE — Patient Instructions (Signed)

## 2022-09-17 ENCOUNTER — Other Ambulatory Visit (INDEPENDENT_AMBULATORY_CARE_PROVIDER_SITE_OTHER): Payer: Self-pay

## 2022-09-17 ENCOUNTER — Ambulatory Visit (INDEPENDENT_AMBULATORY_CARE_PROVIDER_SITE_OTHER): Payer: Federal, State, Local not specified - PPO | Admitting: Physician Assistant

## 2022-09-17 ENCOUNTER — Encounter: Payer: Self-pay | Admitting: Physician Assistant

## 2022-09-17 DIAGNOSIS — M5440 Lumbago with sciatica, unspecified side: Secondary | ICD-10-CM | POA: Diagnosis not present

## 2022-09-17 MED ORDER — METHYLPREDNISOLONE 4 MG PO TBPK: ORAL_TABLET | ORAL | 0 refills | Status: DC

## 2022-09-17 NOTE — Progress Notes (Signed)
Office Visit Note   Patient: Debbie Castro           Date of Birth: 05-Jun-1960           MRN: [id number] Visit Date: 09/17/2022              Requested by: Jackelyn Poling, DO [address] [place],  [place] [address] PCP: Jackelyn Poling, DO   Assessment & Plan: Visit Diagnoses:  1. Acute bilateral low back pain with sciatica, sciatica laterality unspecified     Plan: [name] is a pleasant 62 year old woman with a 79-month history of right posterior buttock pain that radiates down the back of her leg down into her lower leg.  She describes burning in her right posterior buttock as well.  Denies any injuries fever or chills.  She does say that 15 years ago when she was traveling to [place] with some new heavy hiking boots she had a similar episode.  She has also thinks this may be secondary to pulling suitcases through an airport.  She is neurovascular intact and has good strength.  Findings are consistent with a right leg radiculopathy.  Her sensation is intact her strength is 5 out of 5 we talked about the natural history of this.  She would like to try Medrol Dosepak.  She understands not to take her naproxen while she is taking this and also to take it with food we talked about side effects.  I think she do well with a course of physical therapy.  She can follow-up in 5 weeks with Ellin Goodie.  May cancel this if she is doing better  Follow-Up Instructions: No follow-ups on file.   Orders:  Orders Placed This Encounter  Procedures   XR Lumbar Spine 2-3 Views   Ambulatory referral to Physical Therapy   Meds ordered this encounter  Medications   methylPREDNISolone (MEDROL DOSEPAK) 4 MG TBPK tablet    Sig: Take as directed with food    Dispense:  21 tablet    Refill:  0      Procedures: No procedures performed   Clinical Data: No additional findings.   Subjective: No chief complaint on file.   HPI [name] is a pleasant active 62 year old woman who comes in today with a  90-month history of pain running down her right leg.  She denies any injury.  She thinks she maybe had something similar several years ago when she was traveling to [place] but thought it was because of some new shoes she had bought.  She is also unsure she had another recent trip to [place] and she thinks maybe pulling suitcases bothered it.  She says it runs down her right posterior buttock and back leg down to her lower calf.  Denies any fever or chills.  She has taken naproxen for a long time prior to this.  She did see her primary care recently who added told her to do some stretching and add some Tylenol.  She is also lost several pounds intentionally.  Describes her pain as moderate with associated burning no loss of bowel or bladder control  Review of Systems  All other systems reviewed and are negative.    Objective: Vital Signs: There were no vitals taken for this visit.  Physical Exam Constitutional:      Appearance: Normal appearance.  Pulmonary:     Effort: Pulmonary effort is normal.  Skin:    General: Skin is warm and dry.  Neurological:  General: No focal deficit present.     Mental Status: She is alert.  Psychiatric:        Mood and Affect: Mood normal.        Behavior: Behavior normal.     Ortho Exam Examination of her low back she has some stretching feeling in the back of her low back and posterior buttock with forward flexion.  None really with extension.  Sensation is intact she is neurovascularly intact.  She has 5 out of 5 strength with resisted dorsiflexion plantarflexion extension and flexion of her leg as well as hip flexors.  No pain in the groin or with manipulation.  Low back has no step-offs no erythema pain is more in the buttock area Specialty Comments:  No specialty comments available.  Imaging: XR Lumbar Spine 2-3 Views  Result Date: 09/17/2022 Radiographs of the lumbar spine were obtained today.  She has no evidence of listhesis.  She does have  some endplate degenerative changes and some facet arthropathy.  Most notably an L4-5 L5-S1    PMFS History: Patient Active Problem List   Diagnosis Date Noted   Type 2 diabetes mellitus with hyperglycemia, without long-term current use of insulin (HCC) 05/17/2021   Vitamin D deficiency 05/17/2021   Trochanteric bursitis of left hip 04/02/2018   Past Medical History:  Diagnosis Date   Allergy    mold.seasonal   Anxiety    occasional   Asthma    Back pain    Bilateral swelling of feet and ankles    Constipation    Diabetes mellitus without complication (HCC)    prediabetic per PCP   HSV-1 infection    Fever blisters.   Hyperlipidemia    Hypertension    Joint pain    Kidney problem    Other fatigue    Prediabetes    Shortness of breath on exertion    Vitamin D deficiency     Family History  Problem Relation Age of Onset   Anxiety disorder Mother    Stroke Mother    Glaucoma Mother        loss of vision left eye   Anxiety disorder Father    Hyperlipidemia Father    Hypertension Father    Stroke Father    Cancer Maternal Grandfather    Anxiety disorder Other    Colon cancer Neg Hx    Esophageal cancer Neg Hx    Stomach cancer Neg Hx    Breast cancer Neg Hx    Colon polyps Neg Hx    Rectal cancer Neg Hx     Past Surgical History:  Procedure Laterality Date   ANKLE SURGERY  01/15/1996   right   COLONOSCOPY     TOTAL ABDOMINAL HYSTERECTOMY  01/15/2003   still has ovaries   WISDOM TOOTH EXTRACTION  01/15/1980   Social History   Occupational History   Occupation: Airline pilot for [employer]    Employer: [employer]  Tobacco Use   Smoking status: Never    Passive exposure: Never   Smokeless tobacco: Never  Vaping Use   Vaping status: Never Used  Substance and Sexual Activity   Alcohol use: Yes    Comment: 2-3 drinks per year   Drug use: Never   Sexual activity: Yes    Partners: Male    Birth control/protection: Surgical

## 2022-09-17 NOTE — Progress Notes (Signed)
TeleHealth Visit:  This visit was completed with telemedicine (audio/video) technology. Debbie Castro has verbally consented to this TeleHealth visit. The patient is located at home, the provider is located at home. The participants in this visit include the listed provider and patient. The visit was conducted today via MyChart video.  OBESITY Debbie Castro is here to discuss her progress with her obesity treatment plan along with follow-up of her obesity related diagnoses.   Today's visit was # 27 Starting weight: 226 lbs Starting date: 07/18/20 Weight at last in office visit: 177 lbs on 08/07/22 Total weight loss: 49 lbs at last in office visit on 08/07/22. Today's reported weight (09/18/22): none reported  Nutrition Plan: the Category 2 plan - 50% adherence.  Current exercise:  gym for 45 minutes 5 times per week (other than past week due to sciatica)  Interim History:  She does not follow the plan exactly. She is on plan breakfast and lunch but not always at dinner. They eat out twice weekly. Last night had BBQ ribs and squash. Has had issues with sciatica and has not been to the gym the last several days.  Prescribed a course of prednisone.  Referred to PT. No intake of sugar sweetened beverages. She says she always struggles with getting in all of the meat at dinner.  Eating all of the prescribed protein: no Skipping meals: No Drinking adequate water: Yes Drinking sugar sweetened beverages: No Hunger controlled: well controlled. Cravings controlled:  moderately controlled.  Assessment/Plan:  1. Type 2 Diabetes Mellitus with other specified complication, without long-term current use of insulin HgbA1c is at goal. Last A1c was 5.4. CBGs: Not checking.  Does not have a glucometer. Episodes of hypoglycemia: no Medication(s): Ozempic 0.5 mg SQ weekly, Metformin XR 1000 mg once daily per PCP.  Lab Results  Component Value Date   HGBA1C 5.4 12/18/2021   HGBA1C 5.5 06/18/2021    HGBA1C 5.5 12/13/2020   Lab Results  Component Value Date   LDLCALC 27 12/18/2021   CREATININE 0.7 12/18/2021   No results found for: "GFR"  Plan: Continue and increase dose Ozempic 1 mg SQ weekly She may do 0.75 mg dose rather than 1 mg (54 clicks).   Metformin XR 1000 mg once daily  2. Hypertension associated with diabetes Hypertension well controlled.  Medication(s): Lisinopril 5 mg daily  BP Readings from Last 3 Encounters:  09/10/22 128/84  08/07/22 111/73  08/05/22 (!) 152/80   Lab Results  Component Value Date   CREATININE 0.7 12/18/2021   CREATININE 0.69 06/18/2021   CREATININE 1.0 12/13/2020   CREATININE 1.0 12/13/2020   No results found for: "GFR"  Plan: Refill lisinopril 5 mg daily.   3. Morbid Obesity: Current BMI 31  Debbie Castro is currently in the action stage of change. As such, her goal is to continue with weight loss efforts.  She has agreed to the Category 2 plan.  Exercise goals: Advised her to ask physical therapy what exercise she should be doing currently with her sciatica.  Behavioral modification strategies: increasing lean protein intake and planning for success.  Debbie Castro has agreed to follow-up with our clinic in 4 weeks.  No orders of the defined types were placed in this encounter.   Medications Discontinued During This Encounter  Medication Reason   Semaglutide,0.25 or 0.5MG /DOS, (OZEMPIC, 0.25 OR 0.5 MG/DOSE,) 2 MG/3ML SOPN Dose change   lisinopril (ZESTRIL) 5 MG tablet Reorder     Meds ordered this encounter  Medications   lisinopril (ZESTRIL) 5  MG tablet    Sig: Take 1 tablet (5 mg total) by mouth daily.    Dispense:  90 tablet    Refill:  0    Order Specific Question:   Supervising Provider    Answer:   Glennis Brink [[id number]]   Semaglutide, 1 MG/DOSE, 4 MG/3ML SOPN    Sig: Inject 1 mg as directed once a week.    Dispense:  3 mL    Refill:  0    Order Specific Question:   Supervising Provider    Answer:   Glennis Brink  [[id number]]      Objective:   VITALS: Per patient if applicable, see vitals. GENERAL: Alert and in no acute distress. CARDIOPULMONARY: No increased WOB. Speaking in clear sentences.  PSYCH: Pleasant and cooperative. Speech normal rate and rhythm. Affect is appropriate. Insight and judgement are appropriate. Attention is focused, linear, and appropriate.  NEURO: Oriented as arrived to appointment on time with no prompting.   Attestation Statements:   Reviewed by clinician on day of visit: allergies, medications, problem list, medical history, surgical history, family history, social history, and previous encounter notes.   This was prepared with the assistance of Engineer, civil (consulting).  Occasional wrong-word or sound-a-like substitutions may have occurred due to the inherent limitations of voice recognition software.

## 2022-09-18 ENCOUNTER — Telehealth (INDEPENDENT_AMBULATORY_CARE_PROVIDER_SITE_OTHER): Payer: Federal, State, Local not specified - PPO | Admitting: Family Medicine

## 2022-09-18 ENCOUNTER — Encounter (INDEPENDENT_AMBULATORY_CARE_PROVIDER_SITE_OTHER): Payer: Self-pay | Admitting: Family Medicine

## 2022-09-18 DIAGNOSIS — Z7985 Long-term (current) use of injectable non-insulin antidiabetic drugs: Secondary | ICD-10-CM

## 2022-09-18 DIAGNOSIS — E1169 Type 2 diabetes mellitus with other specified complication: Secondary | ICD-10-CM

## 2022-09-18 DIAGNOSIS — E1159 Type 2 diabetes mellitus with other circulatory complications: Secondary | ICD-10-CM | POA: Diagnosis not present

## 2022-09-18 DIAGNOSIS — Z7984 Long term (current) use of oral hypoglycemic drugs: Secondary | ICD-10-CM

## 2022-09-18 DIAGNOSIS — Z6831 Body mass index (BMI) 31.0-31.9, adult: Secondary | ICD-10-CM

## 2022-09-18 DIAGNOSIS — I152 Hypertension secondary to endocrine disorders: Secondary | ICD-10-CM | POA: Diagnosis not present

## 2022-09-18 MED ORDER — SEMAGLUTIDE (1 MG/DOSE) 4 MG/3ML ~~LOC~~ SOPN
1.0000 mg | PEN_INJECTOR | SUBCUTANEOUS | 0 refills | Status: AC
Start: 2022-09-18 — End: ?

## 2022-09-18 MED ORDER — LISINOPRIL 5 MG PO TABS
5.0000 mg | ORAL_TABLET | Freq: Every day | ORAL | 0 refills | Status: DC
Start: 2022-09-18 — End: 2022-12-26

## 2022-09-25 NOTE — Therapy (Signed)
OUTPATIENT PHYSICAL THERAPY EVALUATION   Patient Name: Debbie Castro MRN: [id number] DOB:17-Sep-1960, 62 y.o., female Today's Date: 09/26/2022  END OF SESSION:  PT End of Session - 09/26/22 1138     Visit Number 1    Number of Visits 20    Date for PT Re-Evaluation 12/05/22    Authorization Type [employer] Federal - 50 visit limit calendar year    Progress Note Due on Visit 10    PT Start Time 1140    PT Stop Time 1216    PT Time Calculation (min) 36 min    Activity Tolerance Patient tolerated treatment well    Behavior During Therapy WFL for tasks assessed/performed             Past Medical History:  Diagnosis Date   Allergy    mold.seasonal   Anxiety    occasional   Asthma    Back pain    Bilateral swelling of feet and ankles    Constipation    Diabetes mellitus without complication (HCC)    prediabetic per PCP   HSV-1 infection    Fever blisters.   Hyperlipidemia    Hypertension    Joint pain    Kidney problem    Other fatigue    Prediabetes    Shortness of breath on exertion    Vitamin D deficiency    Past Surgical History:  Procedure Laterality Date   ANKLE SURGERY  01/15/1996   right   COLONOSCOPY     TOTAL ABDOMINAL HYSTERECTOMY  01/15/2003   still has ovaries   WISDOM TOOTH EXTRACTION  01/15/1980   Patient Active Problem List   Diagnosis Date Noted   Type 2 diabetes mellitus with hyperglycemia, without long-term current use of insulin (HCC) 05/17/2021   Vitamin D deficiency 05/17/2021   Trochanteric bursitis of left hip 04/02/2018    PCP: Jackelyn Poling DO  REFERRING PROVIDER: Persons, [place]  REFERRING DIAG: M54.40 (ICD-10-CM) - Acute bilateral low back pain with sciatica, sciatica laterality unspecified  Rationale for Evaluation and Treatment: Rehabilitation  THERAPY DIAG:  Other low back pain  Pain in right leg  Difficulty in walking, not elsewhere classified  Muscle weakness (generalized)  ONSET DATE: May/June  2024  SUBJECTIVE:                                                                                                                                                                                           SUBJECTIVE STATEMENT: Pt indicated complaints on Rt side from back into Rt leg to lower leg.  Reported less complaints in leg but still noted in back.  Reported prednisone seemed to help.  Pt reported sick to stomach at times due to pain.   Insidious onset but maybe related travel in May with luggage movement.   Denied numbness in leg.   Pt indicated not sleeping well prior to pain but it was keeping the patient up prior to medicine.   Reported sitting required for work at home.  Reported worse during the course of day.   Pt reported going to gym routinely 5x/week.   PERTINENT HISTORY:  Anxiety, DM, hyperlipidemia, HTN,   PAIN:  NPRS scale: current 2-3/10, at worst in last few weeks 9/10 Pain location: back, Rt leg  Pain description: throbbing, burning Aggravating factors: prolonged sitting,  Relieving factors: prednisone  PRECAUTIONS: None  WEIGHT BEARING RESTRICTIONS: No  FALLS:  Has patient fallen in last 6 months? 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  Hobbies include antique booth.   PATIENT GOALS: Reduce pain, get back to activities  OBJECTIVE:   PATIENT SURVEYS:  09/26/2022 FOTO eval: 62   predicted:  71  SCREENING FOR RED FLAGS: 09/26/2022 Bowel or bladder incontinence: No Cauda equina syndrome: No  COGNITION: 09/26/2022 Overall cognitive status: WFL normal      SENSATION: 09/26/2022 [facility]  MUSCLE LENGTH: 09/26/2022 No specific testing today  POSTURE:  09/26/2022 decreased lumbar lordosis  PALPATION: 09/26/2022 Mild tenderness in Rt lumbar paraspinals, QL  LUMBAR ROM:  09/26/2022: no centralization or peripiherialization noted with movement.    AROM 09/26/2022  Flexion To ankles with pain Rt lumbar, painful arc upon return  Extension 75 % WFL , repeated x 5 improved to 100 %  Right lateral flexion To knee joint with Rt lumbar pulling  Left lateral flexion To knee joint with pulling  Right rotation   Left rotation    (Blank rows = not tested)  LOWER EXTREMITY ROM:      Right 09/26/2022 Left 09/26/2022  Hip flexion    Hip extension    Hip abduction    Hip adduction    Hip internal rotation    Hip external rotation    Knee flexion    Knee extension    Ankle dorsiflexion    Ankle plantarflexion    Ankle inversion    Ankle eversion     (Blank rows = not tested)  LOWER EXTREMITY MMT:    MMT Right 09/26/2022 Left 09/26/2022  Hip flexion 5/5 5/5  Hip extension    Hip abduction    Hip adduction    Hip internal rotation    Hip external rotation    Knee flexion 5/5 5/5  Knee extension 5/5 5/5  Ankle dorsiflexion 5/5 5/5  Ankle plantarflexion             (Blank rows = not tested) 09/26/2022:  general weakness noted in lumbar erectors in sitting posture  LUMBAR SPECIAL TESTS:  09/26/2022 (-) slump in sitting.  Supine sciatic nerve tension test was + for symptoms on Rt leg.   FUNCTIONAL TESTS:  09/26/2022 18 inch chair s UE assist without symptoms.   GAIT: 09/26/2022 Independent ambulation  TODAY'S TREATMENT:                                                                                                         DATE: 09/26/2022  Therex:    HEP instruction/performance c cues for techniques, handout provided.  Trial set performed of each for comprehension and symptom assessment.  See below for exercise list  PATIENT EDUCATION:  09/26/2022 Education details: HEP, POC Person educated: Patient Education  method: Explanation, Demonstration, Verbal cues, and Handouts Education comprehension: verbalized understanding, returned demonstration, and verbal cues required  HOME EXERCISE PROGRAM: Access Code: [email or web address] URL: [email or web address] Date: 09/26/2022 Prepared by: Chyrel Masson  Exercises - Standing Lumbar Extension with Counter  - 3-5 x daily - 7 x weekly - 1 sets - 5-10 reps - Supine Lower Trunk Rotation  - 2-3 x daily - 7 x weekly - 1 sets - 3-5 reps - 15 hold - Supine Piriformis Stretch with Foot on Ground  - 2-3 x daily - 7 x weekly - 1 sets - 3-5 reps - 15 hold - Supine 90/90 Sciatic Nerve Glide with Knee Flexion/Extension  - 2-3 x daily - 7 x weekly - 1-2 sets - 10 reps - Supine Bridge  - 1-2 x daily - 7 x weekly - 2 sets - 10 reps - 2 hold  ASSESSMENT:  CLINICAL IMPRESSION: Patient is a 62 y.o. who comes to clinic with complaints of low back pain with mobility, strength and movement coordination deficits that impair their ability to perform usual daily and recreational functional activities without increase difficulty/symptoms at this time.  Patient to benefit from skilled PT services to address impairments and limitations to improve to previous level of function without restriction secondary to condition.   OBJECTIVE IMPAIRMENTS: decreased activity tolerance, decreased endurance, decreased mobility, difficulty walking, decreased ROM, decreased strength, increased fascial restrictions, impaired perceived functional ability, impaired flexibility, improper body mechanics, postural dysfunction, and pain.   ACTIVITY LIMITATIONS: carrying, lifting, bending, sitting, sleeping, and locomotion level  PARTICIPATION LIMITATIONS: meal prep, cleaning, laundry, interpersonal relationship, community activity, and occupation  PERSONAL FACTORS:  no specific factors  are affecting patient's functional outcome.   REHAB POTENTIAL: Good  CLINICAL DECISION MAKING:  Stable/uncomplicated  EVALUATION COMPLEXITY: Low   GOALS: Goals reviewed with patient? Yes  SHORT TERM GOALS: (target date for Short term goals are 3 weeks 10/17/2022)  1. Patient will demonstrate independent use of home exercise program to maintain progress from in clinic treatments.  Goal status: New  LONG TERM GOALS: (target dates for all long term goals are 10 weeks  12/05/2022 )   1. Patient will demonstrate/report pain at worst less than or equal to 2/10 to facilitate minimal limitation in daily activity secondary to pain symptoms.  Goal status: New   2. Patient will demonstrate independent use of home exercise program to facilitate ability to maintain/progress functional gains from skilled physical therapy services.  Goal status: New   3. Patient will demonstrate FOTO outcome > or = 71 % to indicate reduced disability due to condition.  Goal status: New  4. Patient will demonstrate lumbar extension 100 % WFL s symptoms to facilitate upright standing, walking posture at PLOF s limitation.  Goal status: New   5.  Patient will demonstrate/report ability to perform work s restriction due to symptoms c use of HEP.   Goal status: New   6.  Patient will demonstrate/report ability to perform housework at [facility] s limitation.  Goal status: New     PLAN:  PT FREQUENCY: 1-2x/week  PT DURATION: 10 weeks  PLANNED INTERVENTIONS: Therapeutic exercises, Therapeutic activity, Neuro Muscular re-education, Balance training, Gait training, Patient/Family education, Joint mobilization, Stair training, DME instructions, Dry Needling, Electrical stimulation, Cryotherapy, vasopneumatic device, Moist heat, Taping, Traction Ultrasound, Ionotophoresis 4mg /ml Dexamethasone, and aquatic therapy, Manual therapy.  All included unless contraindicated  PLAN FOR NEXT SESSION: Review HEP knowledge/results.   Recheck lumbar mobility.    Chyrel Masson, PT, DPT, OCS, ATC 09/26/22  12:21 PM

## 2022-09-26 ENCOUNTER — Other Ambulatory Visit: Payer: Self-pay

## 2022-09-26 ENCOUNTER — Encounter: Payer: Self-pay | Admitting: Rehabilitative and Restorative Service Providers"

## 2022-09-26 ENCOUNTER — Ambulatory Visit (INDEPENDENT_AMBULATORY_CARE_PROVIDER_SITE_OTHER): Payer: Federal, State, Local not specified - PPO | Admitting: Rehabilitative and Restorative Service Providers"

## 2022-09-26 DIAGNOSIS — M79604 Pain in right leg: Secondary | ICD-10-CM | POA: Diagnosis not present

## 2022-09-26 DIAGNOSIS — M6281 Muscle weakness (generalized): Secondary | ICD-10-CM | POA: Diagnosis not present

## 2022-09-26 DIAGNOSIS — R262 Difficulty in walking, not elsewhere classified: Secondary | ICD-10-CM

## 2022-09-26 DIAGNOSIS — M5459 Other low back pain: Secondary | ICD-10-CM

## 2022-10-01 ENCOUNTER — Encounter: Payer: Self-pay | Admitting: Rehabilitative and Restorative Service Providers"

## 2022-10-01 ENCOUNTER — Ambulatory Visit: Payer: Federal, State, Local not specified - PPO | Admitting: Physical Therapy

## 2022-10-01 ENCOUNTER — Ambulatory Visit: Payer: Federal, State, Local not specified - PPO | Admitting: Rehabilitative and Restorative Service Providers"

## 2022-10-01 DIAGNOSIS — M5459 Other low back pain: Secondary | ICD-10-CM | POA: Diagnosis not present

## 2022-10-01 DIAGNOSIS — R262 Difficulty in walking, not elsewhere classified: Secondary | ICD-10-CM | POA: Diagnosis not present

## 2022-10-01 DIAGNOSIS — M79604 Pain in right leg: Secondary | ICD-10-CM | POA: Diagnosis not present

## 2022-10-01 DIAGNOSIS — M6281 Muscle weakness (generalized): Secondary | ICD-10-CM

## 2022-10-01 NOTE — Therapy (Signed)
OUTPATIENT PHYSICAL THERAPY TREATMENT   Patient Name: Debbie Castro MRN: [id number] DOB:02-Jul-1960, 62 y.o., female Today's Date: 10/01/2022  END OF SESSION:  PT End of Session - 10/01/22 1258     Visit Number 2    Number of Visits 20    Date for PT Re-Evaluation 12/05/22    Authorization Type [employer] Federal - 50 visit limit calendar year    Progress Note Due on Visit 10    PT Start Time 1258    PT Stop Time 1337    PT Time Calculation (min) 39 min    Activity Tolerance Patient tolerated treatment well    Behavior During Therapy WFL for tasks assessed/performed              Past Medical History:  Diagnosis Date   Allergy    mold.seasonal   Anxiety    occasional   Asthma    Back pain    Bilateral swelling of feet and ankles    Constipation    Diabetes mellitus without complication (HCC)    prediabetic per PCP   HSV-1 infection    Fever blisters.   Hyperlipidemia    Hypertension    Joint pain    Kidney problem    Other fatigue    Prediabetes    Shortness of breath on exertion    Vitamin D deficiency    Past Surgical History:  Procedure Laterality Date   ANKLE SURGERY  01/15/1996   right   COLONOSCOPY     TOTAL ABDOMINAL HYSTERECTOMY  01/15/2003   still has ovaries   WISDOM TOOTH EXTRACTION  01/15/1980   Patient Active Problem List   Diagnosis Date Noted   Type 2 diabetes mellitus with hyperglycemia, without long-term current use of insulin (HCC) 05/17/2021   Vitamin D deficiency 05/17/2021   Trochanteric bursitis of left hip 04/02/2018    PCP: Jackelyn Poling DO  REFERRING PROVIDER: Persons, [place]  REFERRING DIAG: M54.40 (ICD-10-CM) - Acute bilateral low back pain with sciatica, sciatica laterality unspecified  Rationale for Evaluation and Treatment: Rehabilitation  THERAPY DIAG:  Other low back pain  Pain in right leg  Difficulty in walking, not elsewhere classified  Muscle weakness (generalized)  ONSET DATE: May/June  2024  SUBJECTIVE:                                                                                                                                                                                           SUBJECTIVE STATEMENT: Pt indicated feeling some complaints in walking.  Pt indicated feeling it in back and some into thigh.   Pt indicated feeling generally  still better than prior to [place] office visit.   Pt indicated getting up more with sitting for work.    PERTINENT HISTORY:  Anxiety, DM, hyperlipidemia, HTN,   PAIN:  NPRS scale: upon arrival 4/10 Pain location: back, Rt leg  Pain description: throbbing, burning Aggravating factors: prolonged sitting,  Relieving factors: prednisone  PRECAUTIONS: None  WEIGHT BEARING RESTRICTIONS: No  FALLS:  Has patient fallen in last 6 months? 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  Hobbies include antique booth.   PATIENT GOALS: Reduce pain, get back to activities  OBJECTIVE:   PATIENT SURVEYS:  09/26/2022 FOTO eval: 62   predicted:  71  SCREENING FOR RED FLAGS: 09/26/2022 Bowel or bladder incontinence: No Cauda equina syndrome: No  COGNITION: 09/26/2022 Overall cognitive status: WFL normal      SENSATION: 09/26/2022 [facility]  MUSCLE LENGTH: 09/26/2022 No specific testing today  POSTURE:  09/26/2022 decreased lumbar lordosis  PALPATION: 09/26/2022 Mild tenderness in Rt lumbar paraspinals, QL  LUMBAR ROM:  09/26/2022: no centralization or peripiherialization noted with movement.   AROM 09/26/2022 10/01/2022  Flexion To ankles with pain Rt lumbar, painful arc upon return   Extension 75 % WFL , repeated x 5 improved to 100 % 100% WFL  Right lateral flexion To knee joint with Rt lumbar pulling   Left lateral flexion To knee joint with pulling   Right rotation    Left rotation     (Blank rows = not tested)  LOWER  EXTREMITY ROM:      Right 09/26/2022 Left 09/26/2022  Hip flexion    Hip extension    Hip abduction    Hip adduction    Hip internal rotation    Hip external rotation    Knee flexion    Knee extension    Ankle dorsiflexion    Ankle plantarflexion    Ankle inversion    Ankle eversion     (Blank rows = not tested)  LOWER EXTREMITY MMT:    MMT Right 09/26/2022 Left 09/26/2022  Hip flexion 5/5 5/5  Hip extension    Hip abduction    Hip adduction    Hip internal rotation    Hip external rotation    Knee flexion 5/5 5/5  Knee extension 5/5 5/5  Ankle dorsiflexion 5/5 5/5  Ankle plantarflexion             (Blank rows = not tested) 09/26/2022:  general weakness noted in lumbar erectors in sitting posture  LUMBAR SPECIAL TESTS:  09/26/2022 (-) slump in sitting.  Supine sciatic nerve tension test was + for symptoms on Rt leg.   FUNCTIONAL TESTS:  09/26/2022 18 inch chair s UE assist without symptoms.   GAIT: 09/26/2022 Independent ambulation  TODAY'S TREATMENT:                                                                                                         DATE: 10/01/2022  Therex: Nustep lvl 6 8 mins UE/LE Sidelying lumbar regional stretch 15 sec x 3 bilateral SKC supine 15 sec x 3 bilateral Supine bridge 2-3 sec hold x 15 Supine figure 4 pull towards 15 sec x 5 Rt leg   Manual Lt sidelying regional lumbar rotation mobilization g3 Percussive device to Rt QL trigger points    TODAY'S TREATMENT:                                                                                                         DATE: 09/26/2022  Therex:    HEP instruction/performance c cues for techniques, handout provided.  Trial set performed of each for comprehension and symptom  assessment.  See below for exercise list  PATIENT EDUCATION:  10/01/2022 Education details: HEP update Person educated: Patient Education method: Explanation, Demonstration, Verbal cues, and Handouts Education comprehension: verbalized understanding, returned demonstration, and verbal cues required  HOME EXERCISE PROGRAM: Access Code: [email or web address] URL: [email or web address] Date: 10/01/2022 Prepared by: Chyrel Masson  Exercises - Standing Lumbar Extension with Counter  - 3-5 x daily - 7 x weekly - 1 sets - 5-10 reps - Supine Lower Trunk Rotation  - 2-3 x daily - 7 x weekly - 1 sets - 3-5 reps - 15 hold - Supine Piriformis Stretch with Foot on Ground  - 2-3 x daily - 7 x weekly - 1 sets - 3-5 reps - 15 hold - Supine 90/90 Sciatic Nerve Glide with Knee Flexion/Extension  - 2-3 x daily - 7 x weekly - 1-2 sets - 10 reps - Supine Bridge  - 1-2 x daily - 7 x weekly - 2 sets - 10 reps - 2 hold - Sidelying Lumbar Rotation Stretch  - 1 x daily - 7 x weekly - 1 sets - 3-5 reps - 15 hold  ASSESSMENT:  CLINICAL IMPRESSION: Symptoms focused primarily in Rt QL region of lumbar spine with noted.  Myofascial release options used today including mobilization to lumbar spine and percussive device.  Briefly introduced education on dry needling but saved for future if necessary.   OBJECTIVE IMPAIRMENTS: decreased activity tolerance, decreased endurance, decreased mobility, difficulty walking, decreased ROM, decreased strength, increased fascial restrictions, impaired perceived functional ability, impaired flexibility, improper body mechanics, postural dysfunction, and pain.   ACTIVITY LIMITATIONS: carrying, lifting, bending, sitting, sleeping, and locomotion level  PARTICIPATION LIMITATIONS: meal prep, cleaning, laundry, interpersonal relationship, community activity, and occupation  PERSONAL FACTORS:  no specific factors  are affecting patient's functional outcome.   REHAB POTENTIAL:  Good  CLINICAL DECISION MAKING: Stable/uncomplicated  EVALUATION COMPLEXITY: Low   GOALS: Goals reviewed with patient? Yes  SHORT TERM GOALS: (target date for Short term goals are 3 weeks 10/17/2022)  1. Patient will demonstrate independent use of home exercise program to maintain progress from in clinic treatments.  Goal status: on going 10/01/2022  LONG TERM GOALS: (target dates for all long term goals are 10 weeks  12/05/2022 )   1. Patient will demonstrate/report pain at worst less than or equal to 2/10 to facilitate minimal limitation in daily activity secondary to pain symptoms.  Goal status: New   2. Patient will demonstrate independent use of home exercise program to facilitate ability to maintain/progress functional gains from skilled physical therapy services.  Goal status: New   3. Patient will demonstrate FOTO outcome > or = 71 % to indicate reduced disability due to condition.  Goal status: New   4. Patient will demonstrate lumbar extension 100 % WFL s symptoms to facilitate upright standing, walking posture at PLOF s limitation.  Goal status: New   5.  Patient will demonstrate/report ability to perform work s restriction due to symptoms c use of HEP.   Goal status: New   6.  Patient will demonstrate/report ability to perform housework at [facility] s limitation.  Goal status: New     PLAN:  PT FREQUENCY: 1-2x/week  PT DURATION: 10 weeks  PLANNED INTERVENTIONS: Therapeutic exercises, Therapeutic activity, Neuro Muscular re-education, Balance training, Gait training, Patient/Family education, Joint mobilization, Stair training, DME instructions, Dry Needling, Electrical stimulation, Cryotherapy, vasopneumatic device, Moist heat, Taping, Traction Ultrasound, Ionotophoresis 4mg /ml Dexamethasone, and aquatic therapy, Manual therapy.  All included unless contraindicated  PLAN FOR NEXT SESSION: Percussive device if desired.      Chyrel Masson, PT, DPT, OCS,  ATC 10/01/22  1:37 PM

## 2022-10-07 ENCOUNTER — Ambulatory Visit: Payer: Federal, State, Local not specified - PPO | Admitting: Rehabilitative and Restorative Service Providers"

## 2022-10-07 ENCOUNTER — Encounter: Payer: Self-pay | Admitting: Rehabilitative and Restorative Service Providers"

## 2022-10-07 ENCOUNTER — Ambulatory Visit (INDEPENDENT_AMBULATORY_CARE_PROVIDER_SITE_OTHER): Payer: Federal, State, Local not specified - PPO | Admitting: Family Medicine

## 2022-10-07 DIAGNOSIS — R262 Difficulty in walking, not elsewhere classified: Secondary | ICD-10-CM | POA: Diagnosis not present

## 2022-10-07 DIAGNOSIS — M79604 Pain in right leg: Secondary | ICD-10-CM | POA: Diagnosis not present

## 2022-10-07 DIAGNOSIS — M6281 Muscle weakness (generalized): Secondary | ICD-10-CM

## 2022-10-07 DIAGNOSIS — M5459 Other low back pain: Secondary | ICD-10-CM

## 2022-10-07 NOTE — Therapy (Signed)
OUTPATIENT PHYSICAL THERAPY TREATMENT   Patient Name: Debbie Castro MRN: [id number] DOB:09-04-1960, 62 y.o., female Today's Date: 10/07/2022  END OF SESSION:  PT End of Session - 10/07/22 1136     Visit Number 3    Number of Visits 20    Date for PT Re-Evaluation 12/05/22    Authorization Type [employer] Federal - 50 visit limit calendar year    Progress Note Due on Visit 10    PT Start Time 1138    PT Stop Time 1217    PT Time Calculation (min) 39 min    Activity Tolerance Patient tolerated treatment well    Behavior During Therapy WFL for tasks assessed/performed               Past Medical History:  Diagnosis Date   Allergy    mold.seasonal   Anxiety    occasional   Asthma    Back pain    Bilateral swelling of feet and ankles    Constipation    Diabetes mellitus without complication (HCC)    prediabetic per PCP   HSV-1 infection    Fever blisters.   Hyperlipidemia    Hypertension    Joint pain    Kidney problem    Other fatigue    Prediabetes    Shortness of breath on exertion    Vitamin D deficiency    Past Surgical History:  Procedure Laterality Date   ANKLE SURGERY  01/15/1996   right   COLONOSCOPY     TOTAL ABDOMINAL HYSTERECTOMY  01/15/2003   still has ovaries   WISDOM TOOTH EXTRACTION  01/15/1980   Patient Active Problem List   Diagnosis Date Noted   Type 2 diabetes mellitus with hyperglycemia, without long-term current use of insulin (HCC) 05/17/2021   Vitamin D deficiency 05/17/2021   Trochanteric bursitis of left hip 04/02/2018    PCP: Jackelyn Poling DO  REFERRING PROVIDER: Persons, [place], [place]  REFERRING DIAG: M54.40 (ICD-10-CM) - Acute bilateral low back pain with sciatica, sciatica laterality unspecified  Rationale for Evaluation and Treatment: Rehabilitation  THERAPY DIAG:  Other low back pain  Pain in right leg  Difficulty in walking, not elsewhere classified  Muscle weakness (generalized)  ONSET DATE: May/June  2024  SUBJECTIVE:                                                                                                                                                                                           SUBJECTIVE STATEMENT: Pt indicated last night and today feeling more than previous week.   Pt indicated she felt better with Cortisone.   Pt  indicated she has been doing exercise at gym.    PERTINENT HISTORY:  Anxiety, DM, hyperlipidemia, HTN,   PAIN:  NPRS scale: upon arrival 5/10 Pain location: buttock Rt.  Pain description: throbbing, burning Aggravating factors: prolonged sitting,  Relieving factors: prednisone  PRECAUTIONS: None  WEIGHT BEARING RESTRICTIONS: No  FALLS:  Has patient fallen in last 6 months? 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  Hobbies include antique booth.   PATIENT GOALS: Reduce pain, get back to activities  OBJECTIVE:   PATIENT SURVEYS:  09/26/2022 FOTO eval: 62   predicted:  71  SCREENING FOR RED FLAGS: 09/26/2022 Bowel or bladder incontinence: No Cauda equina syndrome: No  COGNITION: 09/26/2022 Overall cognitive status: WFL normal      SENSATION: 09/26/2022 [facility]  MUSCLE LENGTH: 09/26/2022 No specific testing today  POSTURE:  09/26/2022 decreased lumbar lordosis  PALPATION: 09/26/2022 Mild tenderness in Rt lumbar paraspinals, QL  LUMBAR ROM:  09/26/2022: no centralization or peripiherialization noted with movement.   AROM 09/26/2022 10/01/2022 10/07/2022  Flexion To ankles with pain Rt lumbar, painful arc upon return  To ankles with no complaints.   Extension 75 % WFL , repeated x 5 improved to 100 % 100% WFL 100% WFL  X 5 REIS with mild reduction in symptom in buttock/back  Right lateral flexion To knee joint with Rt lumbar pulling  To knee joint, Rt tightness  Left lateral flexion To knee joint with pulling  To knee  joint   Right rotation     Left rotation      (Blank rows = not tested)  LOWER EXTREMITY ROM:      Right 09/26/2022 Left 09/26/2022  Hip flexion    Hip extension    Hip abduction    Hip adduction    Hip internal rotation    Hip external rotation    Knee flexion    Knee extension    Ankle dorsiflexion    Ankle plantarflexion    Ankle inversion    Ankle eversion     (Blank rows = not tested)  LOWER EXTREMITY MMT:    MMT Right 09/26/2022 Left 09/26/2022  Hip flexion 5/5 5/5  Hip extension    Hip abduction    Hip adduction    Hip internal rotation    Hip external rotation    Knee flexion 5/5 5/5  Knee extension 5/5 5/5  Ankle dorsiflexion 5/5 5/5  Ankle plantarflexion             (Blank rows = not tested) 09/26/2022:  general weakness noted in lumbar erectors in sitting posture  LUMBAR SPECIAL TESTS:  09/26/2022 (-) slump in sitting.  Supine sciatic nerve tension test was + for symptoms on Rt leg.   FUNCTIONAL TESTS:  09/26/2022 18 inch chair s UE assist without symptoms.   GAIT: 09/26/2022 Independent ambulation  TODAY'S TREATMENT:                                                                                                         DATE:  10/07/2022  Therex: Nustep lvl 6 10 mins UE/LE SKC supine 30 sec x 5 Rt  Standing lumbar extension AROM 2 x 5  Review of HEP, education on post needling exercise encouragement for mobility in HEP.    Manual Compression to Rt glute max   Trigger Point Dry-Needling  Treatment instructions: Expect mild to moderate muscle soreness. S/S of pneumothorax if dry needled over a lung field, and to seek immediate medical attention should they occur. Patient verbalized understanding of these instructions and education.  Patient  Consent Given: Yes Education handout provided: Yes Muscles treated: Rt glute max Treatment response/outcome: local twitch response.  Good overall tolerance.    TODAY'S TREATMENT:                                                                                                         DATE: 10/01/2022  Therex: Nustep lvl 6 8 mins UE/LE Sidelying lumbar regional stretch 15 sec x 3 bilateral SKC supine 15 sec x 3 bilateral Supine bridge 2-3 sec hold x 15 Supine figure 4 pull towards 15 sec x 5 Rt leg   Manual Lt sidelying regional lumbar rotation mobilization g3 Percussive device to Rt QL trigger points   TODAY'S TREATMENT:                                                                                                         DATE: 09/26/2022  Therex:    HEP instruction/performance c cues for techniques, handout provided.  Trial set performed of each for comprehension and symptom assessment.  See below for exercise list  PATIENT EDUCATION:  10/01/2022 Education details: HEP update Person educated: Patient Education method: Explanation, Demonstration, Verbal cues, and Handouts Education comprehension: verbalized understanding, returned demonstration, and verbal cues required  HOME EXERCISE PROGRAM: Access Code: [email or web address] URL: [email or web address] Date: 10/01/2022 Prepared by: Chyrel Masson  Exercises - Standing Lumbar Extension with Counter  - 3-5 x daily - 7 x weekly - 1 sets - 5-10 reps - Supine  Lower Trunk Rotation  - 2-3 x daily - 7 x weekly - 1 sets - 3-5 reps - 15 hold - Supine Piriformis Stretch with Foot on Ground  - 2-3 x daily - 7 x weekly - 1 sets - 3-5 reps - 15 hold - Supine 90/90 Sciatic Nerve Glide with Knee Flexion/Extension  - 2-3 x daily - 7 x weekly - 1-2 sets - 10 reps - Supine Bridge  - 1-2 x daily - 7 x weekly - 2 sets - 10 reps - 2 hold - Sidelying Lumbar Rotation Stretch  - 1 x daily - 7 x weekly - 1 sets - 3-5 reps - 15  hold  ASSESSMENT:  CLINICAL IMPRESSION: Lumbar mobility continued to show improvement compared to eval.  Rt glute max trigger points noted and treated today with compression, dry needling with localized concordant symptom noted in treatment.    OBJECTIVE IMPAIRMENTS: decreased activity tolerance, decreased endurance, decreased mobility, difficulty walking, decreased ROM, decreased strength, increased fascial restrictions, impaired perceived functional ability, impaired flexibility, improper body mechanics, postural dysfunction, and pain.   ACTIVITY LIMITATIONS: carrying, lifting, bending, sitting, sleeping, and locomotion level  PARTICIPATION LIMITATIONS: meal prep, cleaning, laundry, interpersonal relationship, community activity, and occupation  PERSONAL FACTORS:  no specific factors  are affecting patient's functional outcome.   REHAB POTENTIAL: Good  CLINICAL DECISION MAKING: Stable/uncomplicated  EVALUATION COMPLEXITY: Low   GOALS: Goals reviewed with patient? Yes  SHORT TERM GOALS: (target date for Short term goals are 3 weeks 10/17/2022)  1. Patient will demonstrate independent use of home exercise program to maintain progress from in clinic treatments.  Goal status: on going 10/01/2022  LONG TERM GOALS: (target dates for all long term goals are 10 weeks  12/05/2022 )   1. Patient will demonstrate/report pain at worst less than or equal to 2/10 to facilitate minimal limitation in daily activity secondary to pain symptoms.  Goal status: New   2. Patient will demonstrate independent use of home exercise program to facilitate ability to maintain/progress functional gains from skilled physical therapy services.  Goal status: New   3. Patient will demonstrate FOTO outcome > or = 71 % to indicate reduced disability due to condition.  Goal status: New   4. Patient will demonstrate lumbar extension 100 % WFL s symptoms to facilitate upright standing, walking posture at PLOF  s limitation.  Goal status: New   5.  Patient will demonstrate/report ability to perform work s restriction due to symptoms c use of HEP.   Goal status: New   6.  Patient will demonstrate/report ability to perform housework at [facility] s limitation.  Goal status: New     PLAN:  PT FREQUENCY: 1-2x/week  PT DURATION: 10 weeks  PLANNED INTERVENTIONS: Therapeutic exercises, Therapeutic activity, Neuro Muscular re-education, Balance training, Gait training, Patient/Family education, Joint mobilization, Stair training, DME instructions, Dry Needling, Electrical stimulation, Cryotherapy, vasopneumatic device, Moist heat, Taping, Traction Ultrasound, Ionotophoresis 4mg /ml Dexamethasone, and aquatic therapy, Manual therapy.  All included unless contraindicated  PLAN FOR NEXT SESSION:  Dry needling if desired.    Chyrel Masson, PT, DPT, OCS, ATC 10/07/22  12:13 PM

## 2022-10-14 ENCOUNTER — Encounter: Payer: Self-pay | Admitting: Rehabilitative and Restorative Service Providers"

## 2022-10-14 ENCOUNTER — Ambulatory Visit: Payer: Federal, State, Local not specified - PPO | Admitting: Rehabilitative and Restorative Service Providers"

## 2022-10-14 DIAGNOSIS — M79604 Pain in right leg: Secondary | ICD-10-CM | POA: Diagnosis not present

## 2022-10-14 DIAGNOSIS — M6281 Muscle weakness (generalized): Secondary | ICD-10-CM | POA: Diagnosis not present

## 2022-10-14 DIAGNOSIS — M5459 Other low back pain: Secondary | ICD-10-CM | POA: Diagnosis not present

## 2022-10-14 DIAGNOSIS — R262 Difficulty in walking, not elsewhere classified: Secondary | ICD-10-CM | POA: Diagnosis not present

## 2022-10-14 NOTE — Therapy (Addendum)
OUTPATIENT PHYSICAL THERAPY TREATMENT / DISCHARGE   Patient Name: Debbie Castro MRN: [id number] DOB:April 28, 1960, 62 y.o., female Today's Date: 10/14/2022  END OF SESSION:  PT End of Session - 10/14/22 1204     Visit Number 4    Number of Visits 20    Date for PT Re-Evaluation 12/05/22    Authorization Type [employer] Federal - 50 visit limit calendar year    Progress Note Due on Visit 10    PT Start Time 1138    PT Stop Time 1218    PT Time Calculation (min) 40 min    Activity Tolerance Patient tolerated treatment well    Behavior During Therapy WFL for tasks assessed/performed                Past Medical History:  Diagnosis Date   Allergy    mold.seasonal   Anxiety    occasional   Asthma    Back pain    Bilateral swelling of feet and ankles    Constipation    Diabetes mellitus without complication (HCC)    prediabetic per PCP   HSV-1 infection    Fever blisters.   Hyperlipidemia    Hypertension    Joint pain    Kidney problem    Other fatigue    Prediabetes    Shortness of breath on exertion    Vitamin D deficiency    Past Surgical History:  Procedure Laterality Date   ANKLE SURGERY  01/15/1996   right   COLONOSCOPY     TOTAL ABDOMINAL HYSTERECTOMY  01/15/2003   still has ovaries   WISDOM TOOTH EXTRACTION  01/15/1980   Patient Active Problem List   Diagnosis Date Noted   Type 2 diabetes mellitus with hyperglycemia, without long-term current use of insulin (HCC) 05/17/2021   Vitamin D deficiency 05/17/2021   Trochanteric bursitis of left hip 04/02/2018    PCP: Jackelyn Poling DO  REFERRING PROVIDER: [place]ons, [place]  REFERRING DIAG: M54.40 (ICD-10-CM) - Acute bilateral low back pain with sciatica, sciatica laterality unspecified  Rationale for Evaluation and Treatment: Rehabilitation  THERAPY DIAG:  Other low back pain  Pain in right leg  Difficulty in walking, not elsewhere classified  Muscle weakness (generalized)  ONSET DATE:  May/June 2024  SUBJECTIVE:                                                                                                                                                                                           SUBJECTIVE STATEMENT: Pt indicated feeling like having some improvement from last visit.  Was sore in hip  Pt indicated having complaints  in driving longer distances > 1 hr.    PERTINENT HISTORY:  Anxiety, DM, hyperlipidemia, HTN,   PAIN:  NPRS scale:  up to 4/10 Pain location: buttock Rt.  Pain description: throbbing, burning Aggravating factors: prolonged sitting,  Relieving factors: prednisone  PRECAUTIONS: None  WEIGHT BEARING RESTRICTIONS: No  FALLS:  Has patient fallen in last 6 months? 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  Hobbies include antique booth.   PATIENT GOALS: Reduce pain, get back to activities  OBJECTIVE:   PATIENT SURVEYS:  09/26/2022 FOTO eval: 62   predicted:  71  SCREENING FOR RED FLAGS: 09/26/2022 Bowel or bladder incontinence: No Cauda equina syndrome: No  COGNITION: 09/26/2022 Overall cognitive status: WFL normal      SENSATION: 09/26/2022 [facility]  MUSCLE LENGTH: 09/26/2022 No specific testing today  POSTURE:  09/26/2022 decreased lumbar lordosis  PALPATION: 10/14/2022: Trigger points in Rt glute max, med.   09/26/2022 Mild tenderness in Rt lumbar paraspinals, QL  LUMBAR ROM:  09/26/2022: no centralization or peripiherialization noted with movement.   AROM 09/26/2022 10/01/2022 10/07/2022  Flexion To ankles with pain Rt lumbar, painful arc upon return  To ankles with no complaints.   Extension 75 % WFL , repeated x 5 improved to 100 % 100% WFL 100% WFL  X 5 REIS with mild reduction in symptom in buttock/back  Right lateral flexion To knee joint with Rt lumbar pulling  To knee joint, Rt tightness  Left lateral  flexion To knee joint with pulling  To knee joint   Right rotation     Left rotation      (Blank rows = not tested)  LOWER EXTREMITY ROM:      Right 09/26/2022 Left 09/26/2022  Hip flexion    Hip extension    Hip abduction    Hip adduction    Hip internal rotation    Hip external rotation    Knee flexion    Knee extension    Ankle dorsiflexion    Ankle plantarflexion    Ankle inversion    Ankle eversion     (Blank rows = not tested)  LOWER EXTREMITY MMT:    MMT Right 09/26/2022 Left 09/26/2022  Hip flexion 5/5 5/5  Hip extension    Hip abduction    Hip adduction    Hip internal rotation    Hip external rotation    Knee flexion 5/5 5/5  Knee extension 5/5 5/5  Ankle dorsiflexion 5/5 5/5  Ankle plantarflexion             (Blank rows = not tested) 09/26/2022:  general weakness noted in lumbar erectors in sitting posture  LUMBAR SPECIAL TESTS:  09/26/2022 (-) slump in sitting.  Supine sciatic nerve tension test was + for symptoms on Rt leg.   FUNCTIONAL TESTS:  09/26/2022 18 inch chair s UE assist without symptoms.   GAIT: 09/26/2022 Independent ambulation  TODAY'S TREATMENT:                                                                                                         DATE:  10/14/2022  Therex: Supine bridge x 20 Supine SKC 30 sec x 5 on Rt Supine figure 4 pull towards 30 sec x 5 on Rt.   Review of HEP, education on post needling exercise encouragement for mobility in HEP performd today today.   Manual Compression to Rt glute max.  Percussive device to Rt glute med/max  Trigger Point Dry-Needling  Treatment instructions: Expect mild to moderate muscle soreness. S/S of pneumothorax if dry needled over a lung field, and to seek immediate medical  attention should they occur. Patient verbalized understanding of these instructions and education.  Patient Consent Given: Yes Education handout provided: Yes Muscles treated: Rt glute max, Rt glute med Treatment response/outcome: local twitch response.  Good overall tolerance.   Moist heat in review of after care for needling.   TODAY'S TREATMENT:                                                                                                         DATE:  10/07/2022  Therex: Nustep lvl 6 10 mins UE/LE SKC supine 30 sec x 5 Rt  Standing lumbar extension AROM 2 x 5  Review of HEP, education on post needling exercise encouragement for mobility in HEP.    Manual Compression to Rt glute max   Trigger Point Dry-Needling  Treatment instructions: Expect mild to moderate muscle soreness. S/S of pneumothorax if dry needled over a lung field, and to seek immediate medical attention should they occur. Patient verbalized understanding of these instructions and education.  Patient Consent Given: Yes Education handout provided: Yes Muscles treated: Rt glute max Treatment response/outcome: local twitch response.  Good overall tolerance.    TODAY'S TREATMENT:                                                                                                         DATE: 10/01/2022  Therex: Nustep lvl 6 8 mins UE/LE Sidelying lumbar regional stretch 15 sec x 3 bilateral SKC supine 15 sec x 3  bilateral Supine bridge 2-3 sec hold x 15 Supine figure 4 pull towards 15 sec x 5 Rt leg   Manual Lt sidelying regional lumbar rotation mobilization g3 Percussive device to Rt QL trigger points   PATIENT EDUCATION:  10/01/2022 Education details: HEP update Person educated: Patient Education method: Programmer, multimedia, Demonstration, Verbal cues, and Handouts Education comprehension: verbalized understanding, returned demonstration, and verbal cues required  HOME EXERCISE PROGRAM: Access Code: [email or web address] URL:  [email or web address] Date: 10/01/2022 Prepared by: Chyrel Masson  Exercises - Standing Lumbar Extension with Counter  - 3-5 x daily - 7 x weekly - 1 sets - 5-10 reps - Supine Lower Trunk Rotation  - 2-3 x daily - 7 x weekly - 1 sets - 3-5 reps - 15 hold - Supine Piriformis Stretch with Foot on Ground  - 2-3 x daily - 7 x weekly - 1 sets - 3-5 reps - 15 hold - Supine 90/90 Sciatic Nerve Glide with Knee Flexion/Extension  - 2-3 x daily - 7 x weekly - 1-2 sets - 10 reps - Supine Bridge  - 1-2 x daily - 7 x weekly - 2 sets - 10 reps - 2 hold - Sidelying Lumbar Rotation Stretch  - 1 x daily - 7 x weekly - 1 sets - 3-5 reps - 15 hold  ASSESSMENT:  CLINICAL IMPRESSION: Continued with trigger point release techniques based off report from last visit.  Encouraged continued movement throughout day for soreness and symptom relief.  Plan to reassess FOTO and general mobility next visit.   OBJECTIVE IMPAIRMENTS: decreased activity tolerance, decreased endurance, decreased mobility, difficulty walking, decreased ROM, decreased strength, increased fascial restrictions, impaired perceived functional ability, impaired flexibility, improper body mechanics, postural dysfunction, and pain.   ACTIVITY LIMITATIONS: carrying, lifting, bending, sitting, sleeping, and locomotion level  PARTICIPATION LIMITATIONS: meal prep, cleaning, laundry, interpersonal relationship, community activity, and occupation  PERSONAL FACTORS:  no specific factors  are affecting patient's functional outcome.   REHAB POTENTIAL: Good  CLINICAL DECISION MAKING: Stable/uncomplicated  EVALUATION COMPLEXITY: Low   GOALS: Goals reviewed with patient? Yes  SHORT TERM GOALS: (target date for Short term goals are 3 weeks 10/17/2022)  1. Patient will demonstrate independent use of home exercise program to maintain progress from in clinic treatments.  Goal status: Met 10/14/2022  LONG TERM GOALS: (target dates for all  long term goals are 10 weeks  12/05/2022 )   1. Patient will demonstrate/report pain at worst less than or equal to 2/10 to facilitate minimal limitation in daily activity secondary to pain symptoms.  Goal status: on going 10/14/2022   2. Patient will demonstrate independent use of home exercise program to facilitate ability to maintain/progress functional gains from skilled physical therapy services.  Goal status:  on going 10/14/2022   3. Patient will demonstrate FOTO outcome > or = 71 % to indicate reduced disability due to condition.  Goal status:  on going 10/14/2022   4. Patient will demonstrate lumbar extension 100 % WFL s symptoms to facilitate upright standing, walking posture at PLOF s limitation.  Goal status:  on going 10/14/2022   5.  Patient will demonstrate/report ability to perform work s restriction due to symptoms c use of HEP.   Goal status:  on going 10/14/2022   6.  Patient will demonstrate/report ability to perform housework at [facility] s limitation.  Goal status:  on going 10/14/2022     PLAN:  PT FREQUENCY: 1-2x/week  PT DURATION: 10 weeks  PLANNED INTERVENTIONS: Therapeutic exercises, Therapeutic activity,  Neuro Muscular re-education, Balance training, Gait training, Patient/Family education, Joint mobilization, Stair training, DME instructions, Dry Needling, Electrical stimulation, Cryotherapy, vasopneumatic device, Moist heat, Taping, Traction Ultrasound, Ionotophoresis 4mg /ml Dexamethasone, and aquatic therapy, Manual therapy.  All included unless contraindicated  PLAN FOR NEXT SESSION:  Dry needling, check ROM/FOTO   Chyrel Masson, PT, DPT, OCS, ATC 10/14/22  12:24 PM   PHYSICAL THERAPY DISCHARGE SUMMARY  Visits from Start of Care: 4  Current functional level related to goals / functional outcomes: See note   Remaining deficits: See note   Education / Equipment: HEP  Patient goals were partially met. Patient is being discharged due to not  returning since the last visit.  Chyrel Masson, PT, DPT, OCS, ATC 11/12/22  9:46 AM

## 2022-10-17 ENCOUNTER — Encounter (INDEPENDENT_AMBULATORY_CARE_PROVIDER_SITE_OTHER): Payer: Self-pay | Admitting: Family Medicine

## 2022-10-17 ENCOUNTER — Ambulatory Visit (INDEPENDENT_AMBULATORY_CARE_PROVIDER_SITE_OTHER): Payer: Federal, State, Local not specified - PPO | Admitting: Family Medicine

## 2022-10-17 VITALS — BP 100/67 | HR 81 | Temp 98.4°F | Ht 63.0 in | Wt 181.0 lb

## 2022-10-17 DIAGNOSIS — E559 Vitamin D deficiency, unspecified: Secondary | ICD-10-CM | POA: Diagnosis not present

## 2022-10-17 DIAGNOSIS — Z6832 Body mass index (BMI) 32.0-32.9, adult: Secondary | ICD-10-CM | POA: Diagnosis not present

## 2022-10-17 DIAGNOSIS — Z7985 Long-term (current) use of injectable non-insulin antidiabetic drugs: Secondary | ICD-10-CM

## 2022-10-17 DIAGNOSIS — E669 Obesity, unspecified: Secondary | ICD-10-CM | POA: Diagnosis not present

## 2022-10-17 DIAGNOSIS — E1169 Type 2 diabetes mellitus with other specified complication: Secondary | ICD-10-CM | POA: Diagnosis not present

## 2022-10-17 MED ORDER — SEMAGLUTIDE (1 MG/DOSE) 4 MG/3ML ~~LOC~~ SOPN
1.0000 mg | PEN_INJECTOR | SUBCUTANEOUS | 0 refills | Status: DC
Start: 2022-10-17 — End: 2022-11-21

## 2022-10-17 NOTE — Progress Notes (Signed)
Chief Complaint:   OBESITY Debbie Castro is here to discuss her progress with her obesity treatment plan along with follow-up of her obesity related diagnoses. [name] is on the Category 2 Plan and states she is following her eating plan approximately 25-50% of the time. [name] states she is exercising for 60 minutes 5 times per week.  Today's visit was #: 28 Starting weight: 226 lbs Starting date: 07/18/2020 Today's weight: 181 lbs Today's date: 10/17/2022 Total lbs lost to date: 45 Total lbs lost since last in-office visit: 0  Interim History: Patient has been dealing with back pain for a while and finally a little over a month ago went and got prednisone and that helped quite a bit.  She is doing PT now and that is helping but today in particular she has been sitting consistently and that has made it worse. She isn't sure what elicited her back pain.  She did do some dry needling.  Food wise she has been trying to stick to the meal plan and she realizes she isn't necessarily perfect with sticking to the meal plan. She is planning to visit her son in [place] the second full week of October.   Subjective:   1. Type 2 diabetes mellitus with other specified complication, without long-term current use of insulin (HCC) Patient is doing around 0.75 mg Ozempic weekly with no change in satiety or hunger. Her last A1c was controlled in December.   2. Vitamin D deficiency Patient is on 2,000 IU daily, and she notes fatigue. She denies nausea, vomiting, or muscle weakness.   Assessment/Plan:   1. Type 2 diabetes mellitus with other specified complication, without long-term current use of insulin (HCC) We will refill Ozempic 1 mg SubQ weekly for 1 month.   - Semaglutide, 1 MG/DOSE, 4 MG/3ML SOPN; Inject 1 mg as directed once a week.  Dispense: 3 mL; Refill: 0  2. Vitamin D deficiency Patient will continue Vitamin D with no change in dose.   3. BMI 32.0-32.9,adult  4. Obesity with starting BMI  of 40.0 [name] is currently in the action stage of change. As such, her goal is to continue with weight loss efforts. She has agreed to the Category 2 Plan.   Exercise goals: All adults should avoid inactivity. Some physical activity is better than none, and adults who participate in any amount of physical activity gain some health benefits.  Behavioral modification strategies: increasing lean protein intake, meal planning and cooking strategies, keeping healthy foods in the home, and planning for success.  [name] has agreed to follow-up with our clinic in 5 to 6 weeks. She was informed of the importance of frequent follow-up visits to maximize her success with intensive lifestyle modifications for her multiple health conditions.   Objective:   Blood pressure 100/67, pulse 81, temperature 98.4 F (36.9 C), height 5\' 3"  (1.6 m), weight 181 lb (82.1 kg), SpO2 98%. Body mass index is 32.06 kg/m.  General: Cooperative, alert, well developed, in no acute distress. HEENT: Conjunctivae and lids unremarkable. Cardiovascular: Regular rhythm.  Lungs: Normal work of breathing. Neurologic: No focal deficits.   Lab Results  Component Value Date   CREATININE 0.7 12/18/2021   BUN 18 12/18/2021   NA 141 12/18/2021   K 4.2 12/18/2021   CL 104 12/18/2021   CO2 27 (A) 12/18/2021   Lab Results  Component Value Date   ALT 14 12/18/2021   AST 16 12/18/2021   ALKPHOS 75 06/18/2021   BILITOT  0.5 06/18/2021   Lab Results  Component Value Date   HGBA1C 5.4 12/18/2021   HGBA1C 5.5 06/18/2021   HGBA1C 5.5 12/13/2020   Lab Results  Component Value Date   INSULIN 7.2 06/18/2021   INSULIN 15.1 07/18/2020   Lab Results  Component Value Date   TSH 0.99 12/13/2020   Lab Results  Component Value Date   CHOL 89 12/18/2021   HDL 47 12/18/2021   LDLCALC 27 12/18/2021   TRIG 72 12/18/2021   Lab Results  Component Value Date   VD25OH 53.3 12/18/2021   VD25OH 72.1 06/18/2021   VD25OH 77.8  12/13/2020   Lab Results  Component Value Date   WBC 5.3 12/18/2021   HGB 13.1 12/18/2021   HCT 43 12/13/2020   HCT 43 12/13/2020   MCV 91 07/18/2020   PLT 241 12/13/2020   No results found for: "IRON", "TIBC", "FERRITIN"  Attestation Statements:   Reviewed by clinician on day of visit: allergies, medications, problem list, medical history, surgical history, family history, social history, and previous encounter notes.   I, Burt Knack, am acting as transcriptionist for [name], MD.  I have reviewed the above documentation for accuracy and completeness, and I agree with the above. - [name], MD

## 2022-10-22 ENCOUNTER — Encounter: Payer: Self-pay | Admitting: Physical Medicine and Rehabilitation

## 2022-10-22 ENCOUNTER — Ambulatory Visit: Payer: Federal, State, Local not specified - PPO | Admitting: Physical Medicine and Rehabilitation

## 2022-10-22 DIAGNOSIS — M5441 Lumbago with sciatica, right side: Secondary | ICD-10-CM

## 2022-10-22 DIAGNOSIS — G8929 Other chronic pain: Secondary | ICD-10-CM

## 2022-10-22 DIAGNOSIS — M5416 Radiculopathy, lumbar region: Secondary | ICD-10-CM

## 2022-10-22 DIAGNOSIS — M47816 Spondylosis without myelopathy or radiculopathy, lumbar region: Secondary | ICD-10-CM

## 2022-10-22 MED ORDER — DIAZEPAM 5 MG PO TABS
ORAL_TABLET | ORAL | 0 refills | Status: DC
Start: 2022-10-22 — End: 2022-11-21

## 2022-10-22 NOTE — Progress Notes (Unsigned)
Debbie Castro - 63 y.o. female MRN [id number]  Date of birth: 1960/07/22  Office Visit Note: Visit Date: 10/22/2022 PCP: Jackelyn Poling, DO Referred by: Jackelyn Poling, DO  Subjective: Chief Complaint  Patient presents with   Lower Back - Pain   HPI: Debbie Castro is a 62 y.o. female who comes in today per the request of [place] Persons, PA for evaluation of chronic, worsening and severe right sided lower back radiating to buttock and down posterior thigh to knee, reports intermittent pain to posterior lower leg. Pain ongoing for 4 months, worsens with prolonged sitting. She describes pain as dull sensation, currently rates as 4 out of 10. Some relief of pain with home exercise regimen, rest and use of medications. She reports significant relief of pain with oral Prednisone. Recent formal physical therapy/dry needling provided minimal short term relief. States dry needling was very painful and caused soreness for several days. Recent lumbar x-rays exhibits grade 1 anterolisthesis of L4 on L5, facet arthropathy at L4-L5 and L5-S1. No history of lumbar surgery/injections. Patient denies focal weakness, numbness and tingling. No recent trauma or falls.    Review of Systems  Musculoskeletal:  Positive for back pain.  Neurological:  Negative for tingling, sensory change, focal weakness and weakness.  All other systems reviewed and are negative.  Otherwise per HPI.  Assessment & Plan: Visit Diagnoses:    ICD-10-CM   1. Chronic bilateral low back pain with right-sided sciatica  M54.41 MR LUMBAR SPINE WO CONTRAST   G89.29     2. Lumbar radiculopathy  M54.16 MR LUMBAR SPINE WO CONTRAST    3. Facet arthropathy, lumbar  M47.816 MR LUMBAR SPINE WO CONTRAST       Plan: Findings:  Chronic, worsening and severe right sided lower back radiating to buttock and down posterior thigh to knee, intermittent pain to posterior lower leg. Patient continues to have severe pain despite good conservative  therapies such as formal physical therapy/dry needling, home exercise regimen, rest and use of medications. Patients clinical presentation and exam are consistent with lumbar radiculopathy, more of S1 nerve pattern. There is grade 1 anterolisthesis of L4 on L5, lower lumbar facet arthropathy on recent x-rays. Next step is to obtain lumbar MRI imaging. Patient did voice anxiety related to claustrophobia. I prescribed pre-procedure Valium for her to take on day of imaging. Depending on results of lumbar MRI imaging we discussed possibility of performing lumbar epidural steroid injection. Patient has no questions at this time. I will see her back for lumbar MRI follow up. No red flag symptoms noted upon exam today.     Meds & Orders:  Meds ordered this encounter  Medications   diazepam (VALIUM) 5 MG tablet    Sig: Take one tablet by mouth with food one hour prior to procedure. May repeat 30 minutes prior if needed.    Dispense:  2 tablet    Refill:  0    Orders Placed This Encounter  Procedures   MR LUMBAR SPINE WO CONTRAST    Follow-up: Return for lumbar MRI review.   Procedures: No procedures performed      Clinical History: No specialty comments available.   She reports that she has never smoked. She has never been exposed to tobacco smoke. She has never used smokeless tobacco.  Recent Labs    12/18/21 0000  HGBA1C 5.4    Objective:  VS:  HT:    WT:   BMI:     BP:  HR: bpm  TEMP: ( )  RESP:  Physical Exam Vitals and nursing note reviewed.  HENT:     Head: Normocephalic and atraumatic.     Right Ear: External ear normal.     Left Ear: External ear normal.     Nose: Nose normal.     Mouth/Throat:     Mouth: Mucous membranes are moist.  Eyes:     Pupils: Pupils are equal, round, and reactive to light.  Cardiovascular:     Rate and Rhythm: Normal rate.     Pulses: Normal pulses.  Pulmonary:     Effort: Pulmonary effort is normal.  Abdominal:     General: Abdomen is  flat. There is no distension.  Musculoskeletal:        General: Tenderness present.     Cervical back: Normal range of motion.     Comments: Patient rises from seated position to standing without difficulty. Good lumbar range of motion. No pain noted with facet loading. 5/5 strength noted with bilateral hip flexion, knee flexion/extension, ankle dorsiflexion/plantarflexion and EHL. No clonus noted bilaterally. No pain upon palpation of greater trochanters. No pain with internal/external rotation of bilateral hips. Sensation intact bilaterally. Dysesthesias noted to right S1 dermatome. Negative slump test bilaterally. Ambulates without aid, gait steady.     Skin:    General: Skin is warm and dry.     Capillary Refill: Capillary refill takes less than 2 seconds.  Neurological:     General: No focal deficit present.     Mental Status: She is alert and oriented to person, place, and time.  Psychiatric:        Mood and Affect: Mood normal.        Behavior: Behavior normal.     Ortho Exam  Imaging: No results found.  Past Medical/Family/Surgical/Social History: Medications & Allergies reviewed per EMR, new medications updated. Patient Active Problem List   Diagnosis Date Noted   Type 2 diabetes mellitus with hyperglycemia, without long-term current use of insulin (HCC) 05/17/2021   Vitamin D deficiency 05/17/2021   Trochanteric bursitis of left hip 04/02/2018   Past Medical History:  Diagnosis Date   Allergy    mold.seasonal   Anxiety    occasional   Asthma    Back pain    Bilateral swelling of feet and ankles    Constipation    Diabetes mellitus without complication (HCC)    prediabetic per PCP   HSV-1 infection    Fever blisters.   Hyperlipidemia    Hypertension    Joint pain    Kidney problem    Other fatigue    Prediabetes    Shortness of breath on exertion    Vitamin D deficiency    Family History  Problem Relation Age of Onset   Anxiety disorder Mother     Stroke Mother    Glaucoma Mother        loss of vision left eye   Anxiety disorder Father    Hyperlipidemia Father    Hypertension Father    Stroke Father    Cancer Maternal Grandfather    Anxiety disorder Other    Colon cancer Neg Hx    Esophageal cancer Neg Hx    Stomach cancer Neg Hx    Breast cancer Neg Hx    Colon polyps Neg Hx    Rectal cancer Neg Hx    Past Surgical History:  Procedure Laterality Date   ANKLE SURGERY  01/15/1996   right  COLONOSCOPY     TOTAL ABDOMINAL HYSTERECTOMY  01/15/2003   still has ovaries   WISDOM TOOTH EXTRACTION  01/15/1980   Social History   Occupational History   Occupation: Airline pilot for [employer]    Employer: [employer]  Tobacco Use   Smoking status: Never    Passive exposure: Never   Smokeless tobacco: Never  Vaping Use   Vaping status: Never Used  Substance and Sexual Activity   Alcohol use: Yes    Comment: 2-3 drinks per year   Drug use: Never   Sexual activity: Yes    Partners: Male    Birth control/protection: Surgical

## 2022-10-22 NOTE — Progress Notes (Unsigned)
Functional Pain Scale - descriptive words and definitions  Moderate (4)   Constantly aware of pain, can complete ADLs with modification/sleep marginally affected at times/passive distraction is of no use, but active distraction gives some relief. Moderate range order  Average Pain 4-6  Lower back pain on right side that radiates into the back of the thigh

## 2022-11-15 ENCOUNTER — Ambulatory Visit
Admission: RE | Admit: 2022-11-15 | Discharge: 2022-11-15 | Disposition: A | Discharge status: Home / Self Care | Payer: Federal, State, Local not specified - PPO | Source: Ambulatory Visit | Attending: Physical Medicine and Rehabilitation | Admitting: Physical Medicine and Rehabilitation

## 2022-11-15 DIAGNOSIS — M545 Low back pain, unspecified: Secondary | ICD-10-CM | POA: Diagnosis not present

## 2022-11-15 DIAGNOSIS — M47816 Spondylosis without myelopathy or radiculopathy, lumbar region: Secondary | ICD-10-CM

## 2022-11-15 DIAGNOSIS — G8929 Other chronic pain: Secondary | ICD-10-CM

## 2022-11-15 DIAGNOSIS — M5416 Radiculopathy, lumbar region: Secondary | ICD-10-CM

## 2022-11-19 ENCOUNTER — Telehealth (INDEPENDENT_AMBULATORY_CARE_PROVIDER_SITE_OTHER): Payer: Self-pay

## 2022-11-19 NOTE — Telephone Encounter (Signed)
This is to inform you that your Prior Authorization request for the above member's Ozempic (1 MG/DOSE) 4MG /3ML [place] SOPN has been approved. If you are changing the member's therapy the previously approved therapy will be canceled and replaced. The authorization is valid from 10/20/2022 through 11/19/2023. A letter of explanation will also be mailed to the patient.

## 2022-11-19 NOTE — Telephone Encounter (Signed)
Prior auth submitted for Ozempic.  Awaiting determination.

## 2022-11-21 ENCOUNTER — Encounter (INDEPENDENT_AMBULATORY_CARE_PROVIDER_SITE_OTHER): Payer: Self-pay | Admitting: Family Medicine

## 2022-11-21 ENCOUNTER — Other Ambulatory Visit (INDEPENDENT_AMBULATORY_CARE_PROVIDER_SITE_OTHER): Payer: Self-pay

## 2022-11-21 ENCOUNTER — Ambulatory Visit (INDEPENDENT_AMBULATORY_CARE_PROVIDER_SITE_OTHER): Payer: Federal, State, Local not specified - PPO | Admitting: Family Medicine

## 2022-11-21 VITALS — BP 118/76 | HR 76 | Temp 98.2°F | Ht 63.0 in | Wt 183.0 lb

## 2022-11-21 DIAGNOSIS — Z6832 Body mass index (BMI) 32.0-32.9, adult: Secondary | ICD-10-CM

## 2022-11-21 DIAGNOSIS — E669 Obesity, unspecified: Secondary | ICD-10-CM

## 2022-11-21 DIAGNOSIS — E1169 Type 2 diabetes mellitus with other specified complication: Secondary | ICD-10-CM

## 2022-11-21 DIAGNOSIS — Z7984 Long term (current) use of oral hypoglycemic drugs: Secondary | ICD-10-CM

## 2022-11-21 DIAGNOSIS — E1165 Type 2 diabetes mellitus with hyperglycemia: Secondary | ICD-10-CM | POA: Diagnosis not present

## 2022-11-21 DIAGNOSIS — E559 Vitamin D deficiency, unspecified: Secondary | ICD-10-CM

## 2022-11-21 DIAGNOSIS — Z7985 Long-term (current) use of injectable non-insulin antidiabetic drugs: Secondary | ICD-10-CM

## 2022-11-21 MED ORDER — METFORMIN HCL ER 500 MG PO TB24
1000.0000 mg | ORAL_TABLET | Freq: Every day | ORAL | 0 refills | Status: DC
Start: 2022-11-21 — End: 2023-03-12

## 2022-11-21 MED ORDER — SEMAGLUTIDE (2 MG/DOSE) 8 MG/3ML ~~LOC~~ SOPN: 2.0000 mg | PEN_INJECTOR | SUBCUTANEOUS | 0 refills | Status: DC

## 2022-11-21 NOTE — Assessment & Plan Note (Signed)
On 2k international units daily OTC.  Last level was 53 in Epic but has had level since then with [facility].  She still has fatigue but no nausea, vomiting or muscle weakness.

## 2022-11-21 NOTE — Assessment & Plan Note (Addendum)
Patient on 1mg  Ozempic and not noticing much difference in satiety between 0.5mg  to 1mg .  She feels ready to increase her medication.  She hasn't had an GI side effects with the changes in doses.  She will start on the 2mg  pen and will try to increase to middle dose between 1 and 2mg  between the this appointment and next. She also needs refill of metformin which was sent in today.

## 2022-11-21 NOTE — Progress Notes (Signed)
SUBJECTIVE:  Chief Complaint: Obesity  Interim History: Patient has been on plan about 50% of the time.  She hasn't weighed herself since last appointment.  She recognizes she needs to get herself refocused.  She does well at picking the nutritious foods but does snack more than her allotted calories.  She thinks one week of extra snacking was when she was at her sons house in [place]. She realizes she has more indulgent food in the house and that makes it difficult to control intake.  She is celebrating at her sister's house and her sister is preparing the [place] and patient is preparing the dressing.   Debbie Castro is here to discuss her progress with her obesity treatment plan. She is on the Category 2 Plan and states she is following her eating plan approximately 50 % of the time. She states she is exercising 45 minutes 5 times per week.   OBJECTIVE: Visit Diagnoses: Problem List Items Addressed This Visit       Endocrine   Type 2 diabetes mellitus with hyperglycemia, without long-term current use of insulin (HCC)    Patient on 1mg  Ozempic and not noticing much difference in satiety between 0.5mg  to 1mg .  She feels ready to increase her medication.  She hasn't had an GI side effects with the changes in doses.  She will start on the 2mg  pen and will try to increase to middle dose between 1 and 2mg  between the this appointment and next. She also needs refill of metformin which was sent in today.      Relevant Medications   metFORMIN (GLUCOPHAGE-XR) 500 MG 24 hr tablet   Semaglutide, 2 MG/DOSE, 8 MG/3ML SOPN     Other   Vitamin D deficiency    On 2k international units daily OTC.  Last level was 53 in Epic but has had level since then with [facility].  She still has fatigue but no nausea, vomiting or muscle weakness.        Other Visit Diagnoses     BMI 32.0-32.9,adult    -  Primary   Obesity with starting BMI of 40.0       Relevant Medications   metFORMIN (GLUCOPHAGE-XR) 500 MG 24 hr  tablet   Semaglutide, 2 MG/DOSE, 8 MG/3ML SOPN   Type 2 diabetes mellitus with other specified complication, without long-term current use of insulin (HCC)       Relevant Medications   metFORMIN (GLUCOPHAGE-XR) 500 MG 24 hr tablet   Semaglutide, 2 MG/DOSE, 8 MG/3ML SOPN       Vitals Temp: 98.2 F (36.8 C) BP: 118/76 Pulse Rate: 76 SpO2: 96 %   Anthropometric Measurements Height: 5\' 3"  (1.6 m) Weight: 183 lb (83 kg) BMI (Calculated): 32.43 Weight at Last Visit: 181 lb Weight Lost Since Last Visit: 0 Weight Gained Since Last Visit: 2 Starting Weight: 226 lb Total Weight Loss (lbs): 43 lb (19.5 kg)   Body Composition  Body Fat %: 42.3 % Fat Mass (lbs): 77.4 lbs Muscle Mass (lbs): 100.2 lbs Total Body Water (lbs): 72 lbs Visceral Fat Rating : 12   Other Clinical Data Today's Visit #: 70 Starting Date: 07/15/20     ASSESSMENT AND PLAN:  Diet: Debbie Castro is currently in the action stage of change. As such, her goal is to continue with weight loss efforts. She has agreed to Category 2 Plan.  Exercise: Debbie Castro has been instructed that some exercise is better than none for weight loss and overall health benefits.  Behavior Modification:  We discussed the following Behavioral Modification Strategies today: increasing lean protein intake, increasing vegetables, meal planning and cooking strategies, and holiday eating strategies.   No follow-ups on file.Marland Kitchen She was informed of the importance of frequent follow up visits to maximize her success with intensive lifestyle modifications for her multiple health conditions.  Attestation Statements:   Reviewed by clinician on day of visit: allergies, medications, problem list, medical history, surgical history, family history, social history, and previous encounter notes.    [name], MD

## 2022-11-26 ENCOUNTER — Telehealth: Payer: Self-pay | Admitting: Physical Medicine and Rehabilitation

## 2022-11-26 NOTE — Telephone Encounter (Signed)
Patient called and needs an appointment for her MRI results. CB#[phone]

## 2022-11-27 ENCOUNTER — Telehealth: Payer: Self-pay | Admitting: Physical Medicine and Rehabilitation

## 2022-11-27 NOTE — Telephone Encounter (Signed)
I called patient this morning, unable to leave VM. Recent lumbar MRI imaging shows facet arthropathy at L4-L5 and L5-S1. No severe nerve impingement noted. No high grade spinal canal stenosis. If she continues to experience right sided lower back pain could look at right L4-L5 and L5-S1 intra-articular facet joint injections.

## 2022-12-04 ENCOUNTER — Encounter: Payer: Self-pay | Admitting: Physical Medicine and Rehabilitation

## 2022-12-04 ENCOUNTER — Ambulatory Visit: Payer: Federal, State, Local not specified - PPO | Admitting: Physical Medicine and Rehabilitation

## 2022-12-04 VITALS — BP 113/73 | HR 73

## 2022-12-04 DIAGNOSIS — M4316 Spondylolisthesis, lumbar region: Secondary | ICD-10-CM

## 2022-12-04 DIAGNOSIS — M5416 Radiculopathy, lumbar region: Secondary | ICD-10-CM | POA: Diagnosis not present

## 2022-12-04 DIAGNOSIS — G8929 Other chronic pain: Secondary | ICD-10-CM | POA: Diagnosis not present

## 2022-12-04 DIAGNOSIS — M5441 Lumbago with sciatica, right side: Secondary | ICD-10-CM | POA: Diagnosis not present

## 2022-12-04 MED ORDER — DIAZEPAM 5 MG PO TABS
ORAL_TABLET | ORAL | 0 refills | Status: DC
Start: 2022-12-04 — End: 2022-12-26

## 2022-12-04 NOTE — Progress Notes (Signed)
Debbie Castro - 62 y.o. female MRN [id number]  Date of birth: 1960/12/21  Office Visit Note: Visit Date: 12/04/2022 PCP: Jackelyn Poling, DO Referred by: Jackelyn Poling, DO  Subjective: Chief Complaint  Patient presents with   Lower Back - Pain   Right Leg - Pain   HPI: Debbie Castro is a 62 y.o. female who comes in today for evaluation of chronic, worsening and severe right sided lower back pain radiating to buttock and down posterior thigh to knee. Pain ongoing for several months, worsens with sitting. She describes pain as sore and dull sensation, currently rates as 6 out of 10. Some relief of pain with home exercise regimen, rest and use of medications. History of recent formal physical therapy/dry needling that caused her pain to be more severe. She reports constant pain to right buttock at site of prior dry needling. Recent lumbar MRI imaging exhibits grade 1 anterolisthesis of L4 on L5, mild narrowing of left lateral recess at this level. Moderate left facet arthropathy at L5-S1. No significant nerve impingement, no high grade spinal canal stenosis noted. Patient denies focal weakness, numbness and tingling. No recent trauma or falls.      Review of Systems  Musculoskeletal:  Positive for back pain.  Neurological:  Negative for tingling, focal weakness and weakness.  All other systems reviewed and are negative.  Otherwise per HPI.  Assessment & Plan: Visit Diagnoses: No diagnosis found.   Plan: Findings:  Chronic, worsening and severe right sided lower back pain radiating to buttock and down posterior thigh to knee. Patient continues to have severe pain despite good conservative therapies such as formal physical therapy/dry needling, home exercise regimen, rest and use of medications. Patients clinical presentation and exam are complex, her symptoms do seem more radicular in nature but do not fit with recent lumbar MRI imaging. Could be more of a discogenic pain as her symptoms  worsen with sitting. We discussed treatment plan in detail today, next step is to perform diagnostic and hopefully therapeutic right L5-S1 interlaminar epidural steroid injection under fluoroscopic guidance. She is not currently taking anticoagulant medications. If good relief of pain with injection we can repeat this procedure infrequently as needed. She did voice anxiety related to injection, I called in pre-procedure Valium for her to take prior to injection. Should lower back pain remain could look at possible facet joint injections. Dr. Alvester Morin at bedside to discuss injection procedure, she has no questions at this time. No red flag symptoms noted upon exam today.     Meds & Orders: No orders of the defined types were placed in this encounter.  No orders of the defined types were placed in this encounter.   Follow-up: No follow-ups on file.   Procedures: No procedures performed      Clinical History: CLINICAL DATA:  Low back pain, symptoms persist with > 6 wks treatment. Lower back pain radiating into the right buttock and leg.   EXAM: MRI LUMBAR SPINE WITHOUT CONTRAST   TECHNIQUE: Multiplanar, multisequence MR imaging of the lumbar spine was performed. No intravenous contrast was administered.   COMPARISON:  Lumbar spine radiographs 09/17/2022.   FINDINGS: Segmentation: Conventional numbering is assumed with 5 non-rib-bearing, lumbar type vertebral bodies.   Alignment:  Unchanged grade 1 anterolisthesis of L4 on L5.   Vertebrae:  No fracture, evidence of discitis, or bone lesion.   Conus medullaris and cauda equina: Conus extends to the L1-2 level. Conus and cauda equina appear normal.  Paraspinal and other soft tissues: Mild fatty atrophy of the paraspinal muscles.   Disc levels:   T12-L1:  Normal.   L1-L2:  Normal.   L2-L3:  Normal.   L3-L4:  Normal.   L4-L5: Anterolisthesis with uncovered disc and left-greater-than-right facet arthropathy results in mild  narrowing of the left lateral recess.   L5-S1:  Moderate left facet arthropathy.   IMPRESSION: 1. Unchanged grade 1 anterolisthesis of L4 on L5 with uncovered disc and left-greater-than-right facet arthropathy results in mild narrowing of the left lateral recess. 2. Moderate left facet arthropathy at L5-S1.     Electronically Signed   By: Orvan Falconer M.D.   On: 11/27/2022 10:12   She reports that she has never smoked. She has never been exposed to tobacco smoke. She has never used smokeless tobacco.  Recent Labs    12/18/21 0000 07/23/22 0000  HGBA1C 5.4 6.0    Objective:  VS:  HT:    WT:   BMI:     BP:113/73  HR:73bpm  TEMP: ( )  RESP:  Physical Exam Vitals and nursing note reviewed.  HENT:     Head: Normocephalic and atraumatic.     Right Ear: External ear normal.     Left Ear: External ear normal.     Nose: Nose normal.     Mouth/Throat:     Mouth: Mucous membranes are moist.  Eyes:     Extraocular Movements: Extraocular movements intact.  Cardiovascular:     Rate and Rhythm: Normal rate.     Pulses: Normal pulses.  Pulmonary:     Effort: Pulmonary effort is normal.  Abdominal:     General: Abdomen is flat. There is no distension.  Musculoskeletal:        General: Tenderness present.     Cervical back: Normal range of motion.     Comments: Patient rises from seated position to standing without difficulty. Good lumbar range of motion. No pain noted with facet loading. 5/5 strength noted with bilateral hip flexion, knee flexion/extension, ankle dorsiflexion/plantarflexion and EHL. No clonus noted bilaterally. No pain upon palpation of greater trochanters. No pain with internal/external rotation of bilateral hips. Sensation intact bilaterally. Tenderness noted to right buttock region upon palpation. Negative slump test bilaterally. Ambulates without aid, gait steady.     Skin:    General: Skin is warm and dry.     Capillary Refill: Capillary refill takes  less than 2 seconds.  Neurological:     General: No focal deficit present.     Mental Status: She is alert and oriented to person, place, and time.  Psychiatric:        Mood and Affect: Mood normal.        Behavior: Behavior normal.     Ortho Exam  Imaging: No results found.  Past Medical/Family/Surgical/Social History: Medications & Allergies reviewed per EMR, new medications updated. Patient Active Problem List   Diagnosis Date Noted   Type 2 diabetes mellitus with hyperglycemia, without long-term current use of insulin (HCC) 05/17/2021   Vitamin D deficiency 05/17/2021   Trochanteric bursitis of left hip 04/02/2018   Past Medical History:  Diagnosis Date   Allergy    mold.seasonal   Anxiety    occasional   Asthma    Back pain    Bilateral swelling of feet and ankles    Constipation    Diabetes mellitus without complication (HCC)    prediabetic per PCP   HSV-1 infection    Fever  blisters.   Hyperlipidemia    Hypertension    Joint pain    Kidney problem    Other fatigue    Prediabetes    Shortness of breath on exertion    Vitamin D deficiency    Family History  Problem Relation Age of Onset   Anxiety disorder Mother    Stroke Mother    Glaucoma Mother        loss of vision left eye   Anxiety disorder Father    Hyperlipidemia Father    Hypertension Father    Stroke Father    Cancer Maternal Grandfather    Anxiety disorder Other    Colon cancer Neg Hx    Esophageal cancer Neg Hx    Stomach cancer Neg Hx    Breast cancer Neg Hx    Colon polyps Neg Hx    Rectal cancer Neg Hx    Past Surgical History:  Procedure Laterality Date   ANKLE SURGERY  01/15/1996   right   COLONOSCOPY     TOTAL ABDOMINAL HYSTERECTOMY  01/15/2003   still has ovaries   WISDOM TOOTH EXTRACTION  01/15/1980   Social History   Occupational History   Occupation: Airline pilot for [employer]    Employer: [employer]  Tobacco Use   Smoking status: Never    Passive  exposure: Never   Smokeless tobacco: Never  Vaping Use   Vaping status: Never Used  Substance and Sexual Activity   Alcohol use: Yes    Comment: 2-3 drinks per year   Drug use: Never   Sexual activity: Yes    Partners: Male    Birth control/protection: Surgical

## 2022-12-04 NOTE — Progress Notes (Signed)
Functional Pain Scale - descriptive words and definitions  Distracting (5)    Aware of pain/able to complete some ADL's but limited by pain/sleep is affected and active distractions are only slightly useful. Moderate range order  Average Pain 5  MRI review, stilling having right sided LBP with pain down right leg.

## 2022-12-19 IMAGING — MG DIGITAL DIAGNOSTIC BILAT W/ TOMO W/ CAD
6 of 10 series · 6 of 30 positions shown · non-contrast
Comparison: Previous exam(s).

CLINICAL DATA: 61-year-old female with a physician palpated left
breast lump.

EXAM:
DIGITAL DIAGNOSTIC BILATERAL MAMMOGRAM WITH TOMOSYNTHESIS AND CAD;
ULTRASOUND LEFT BREAST LIMITED
TECHNIQUE: Bilateral digital diagnostic mammography and breast tomosynthesis
was performed. The images were evaluated with computer-aided
detection.; Targeted ultrasound examination of the left breast was
performed.

[L MLO synth-2D]
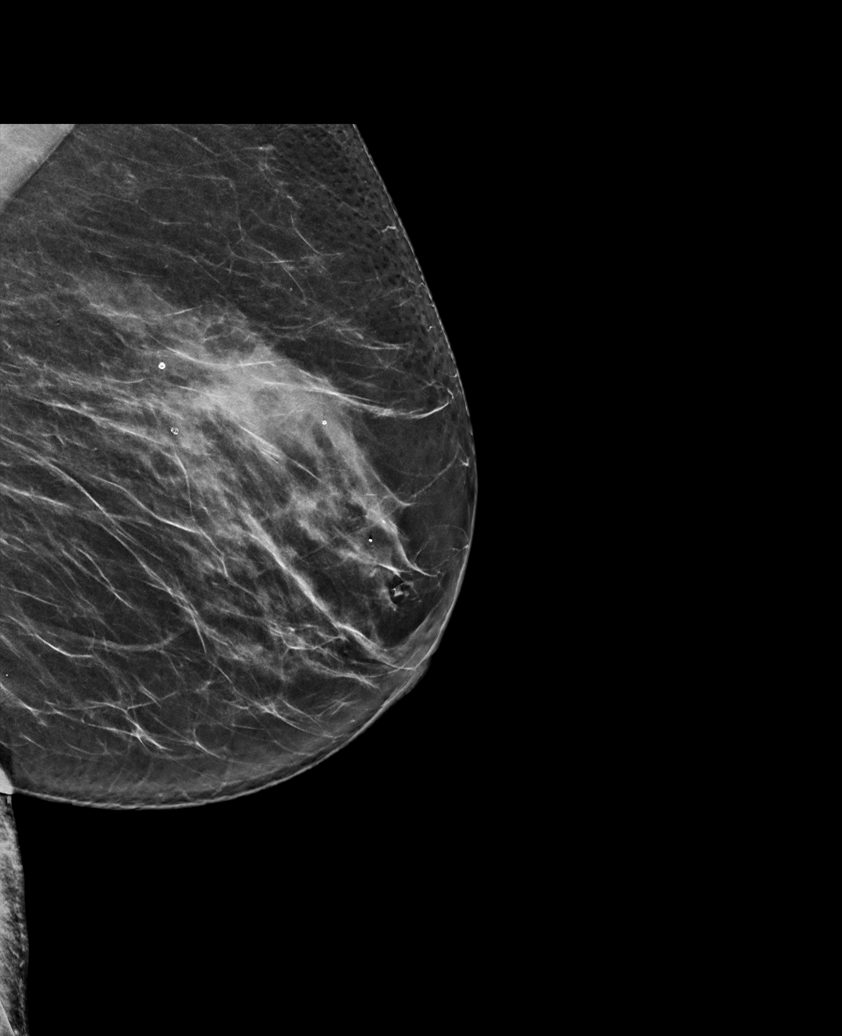

[R CC synth-2D (1 of 2)]
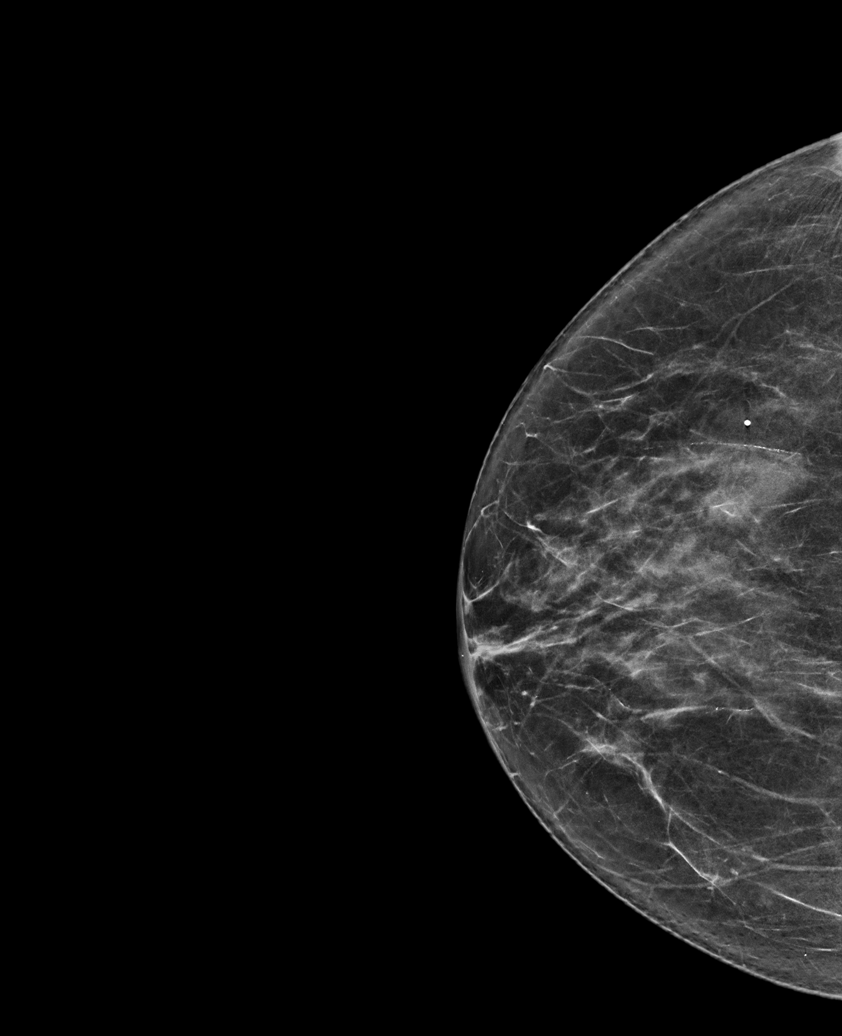

[L CC synth-2D]
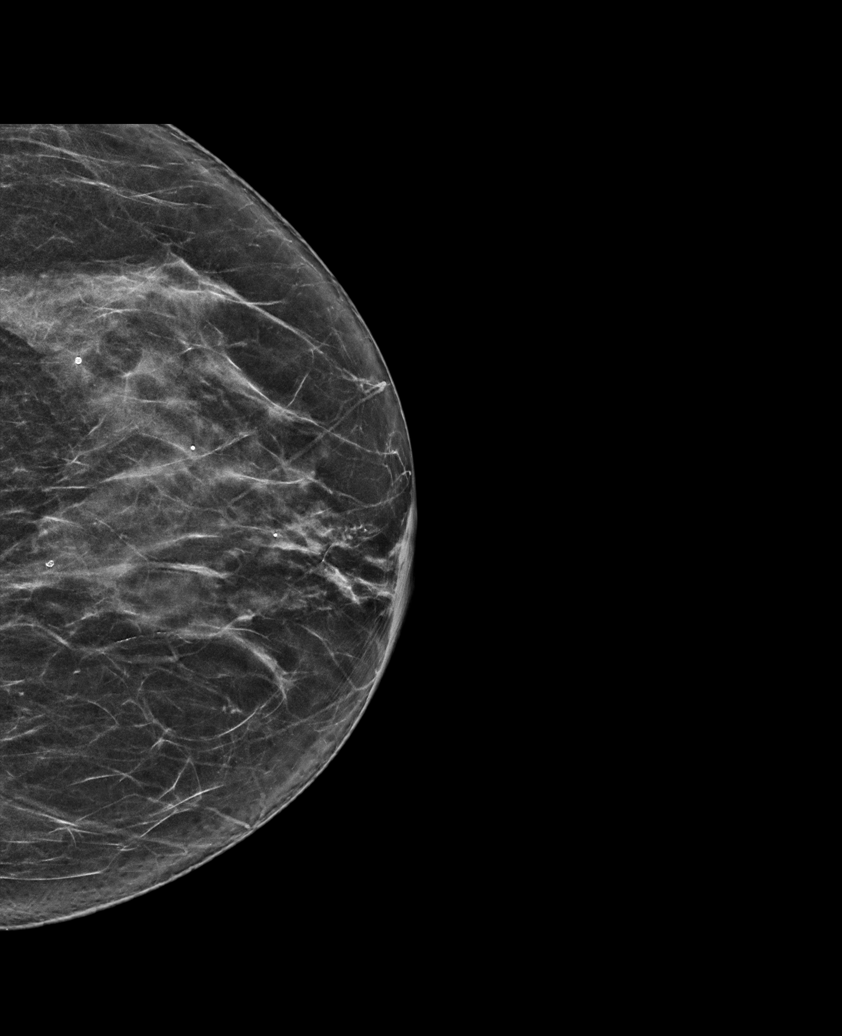

[R CC synth-2D (2 of 2)]
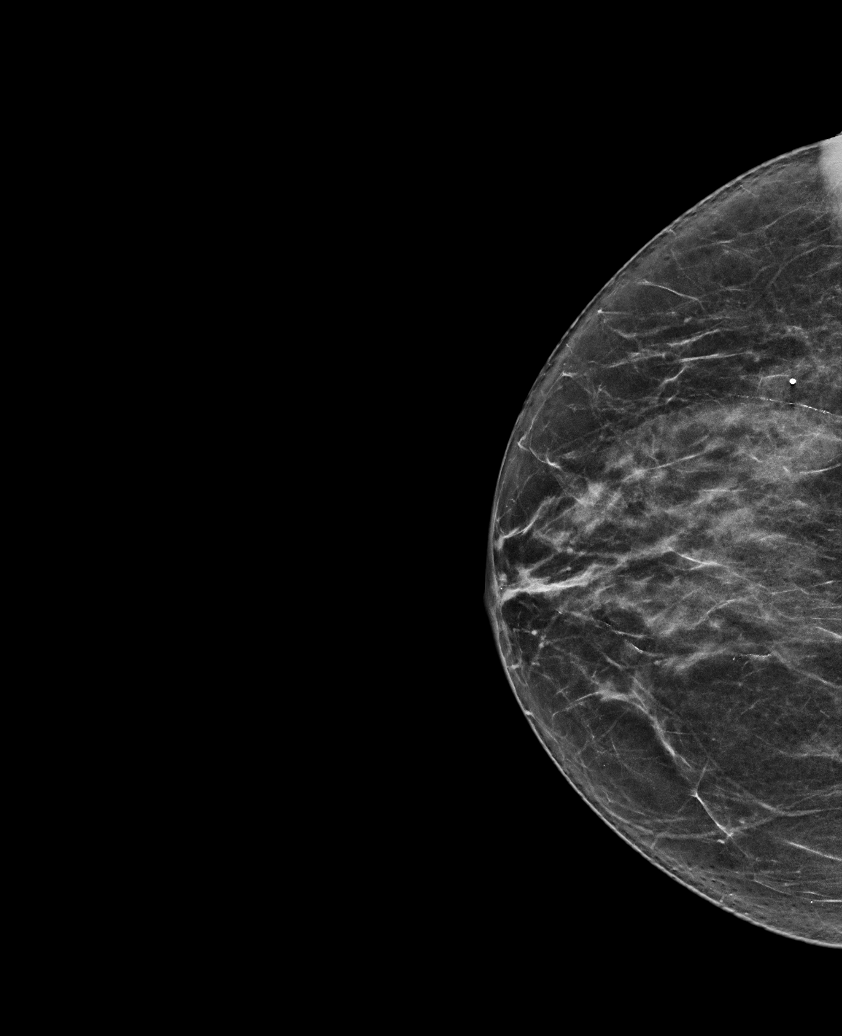

[R MLO synth-2D]
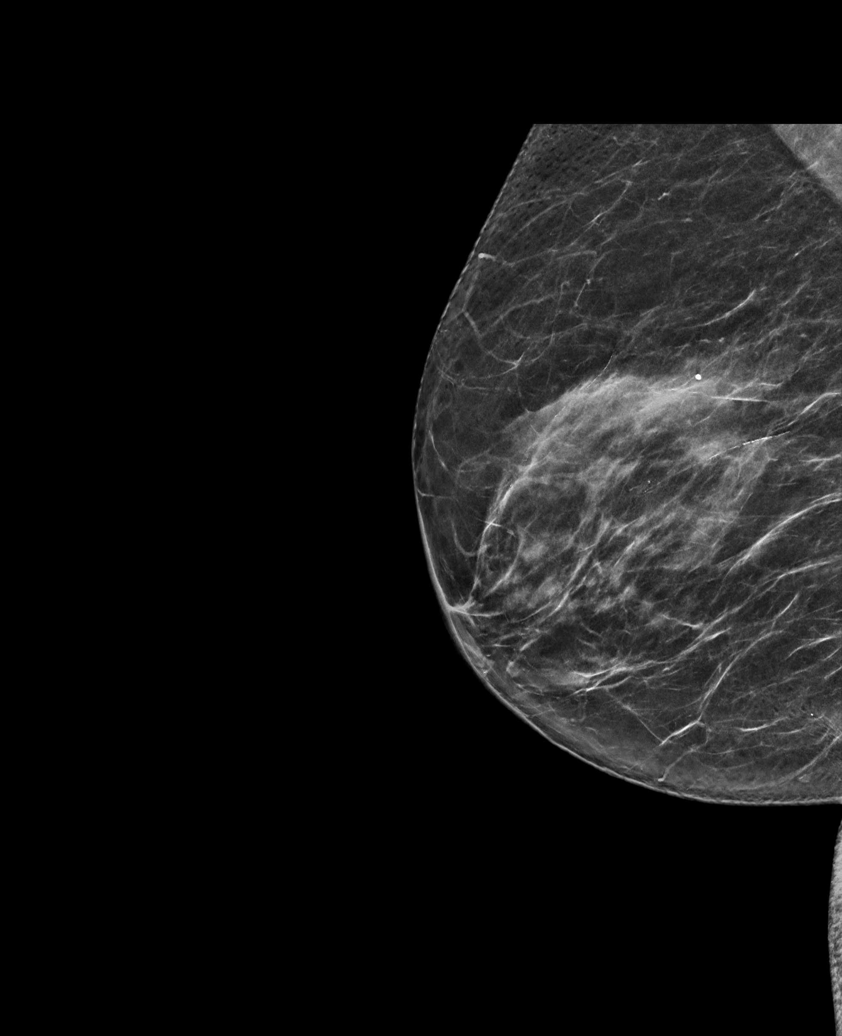

[L CC tomo · tomo slice 35/69.0]
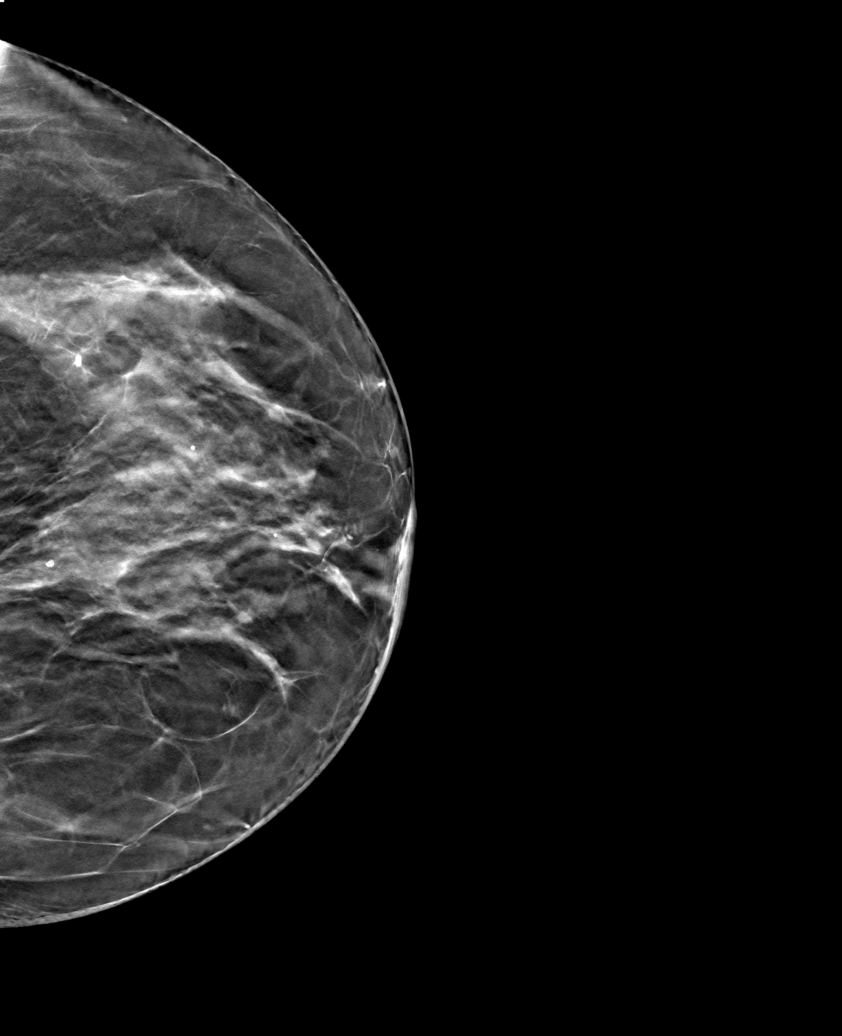

[6 of 30 positions shown; findings below may reference images not displayed]

ACR Breast Density Category c: The breast tissue is heterogeneously
dense, which may obscure small masses.
FINDINGS: No focal or suspicious mammographic findings are identified in
either breast. The parenchymal pattern is stable.

Targeted ultrasound is performed, showing no focal or suspicious
sonographic abnormalities. Evaluation of the entire upper outer
quadrant of the left breast was performed.
IMPRESSION: 1. No mammographic evidence of malignancy in either breast.
2. Unremarkable ultrasound evaluation of the left breast.

RECOMMENDATION:
1. Clinical follow-up recommended for the palpable area of concern
in the left breast. Any further workup should be based on clinical
grounds.
2.  Screening mammogram in one year.(Code:1E-J-GSF)

I have discussed the findings and recommendations with the patient.
If applicable, a reminder letter will be sent to the patient
regarding the next appointment.

BI-RADS CATEGORY  1: Negative.

## 2022-12-19 IMAGING — US US BREAST*L* LIMITED INC AXILLA
1 series · 2 of 2 positions shown · non-contrast
Comparison: Previous exam(s).

CLINICAL DATA: 61-year-old female with a physician palpated left
breast lump.

EXAM:
DIGITAL DIAGNOSTIC BILATERAL MAMMOGRAM WITH TOMOSYNTHESIS AND CAD;
ULTRASOUND LEFT BREAST LIMITED
TECHNIQUE: Bilateral digital diagnostic mammography and breast tomosynthesis
was performed. The images were evaluated with computer-aided
detection.; Targeted ultrasound examination of the left breast was
performed.

[Series 1: us breast*left* limited inc axilla · 0.08mm/px · 2 of 2 slices shown]
[im 1/2]
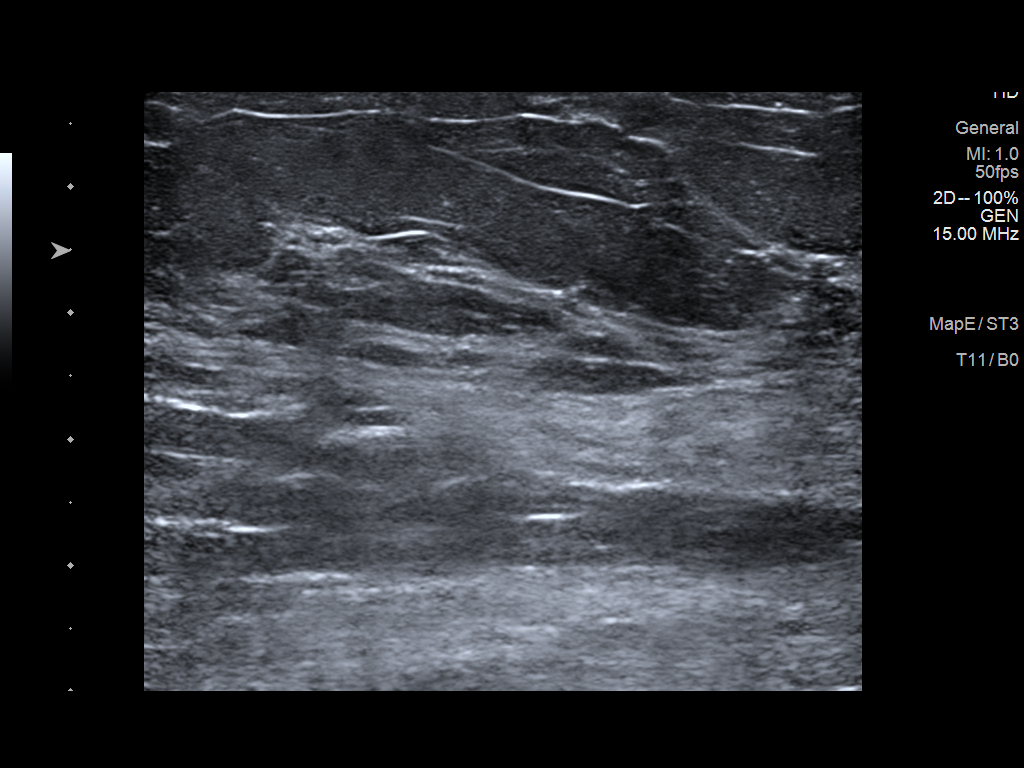
[im 2/2]
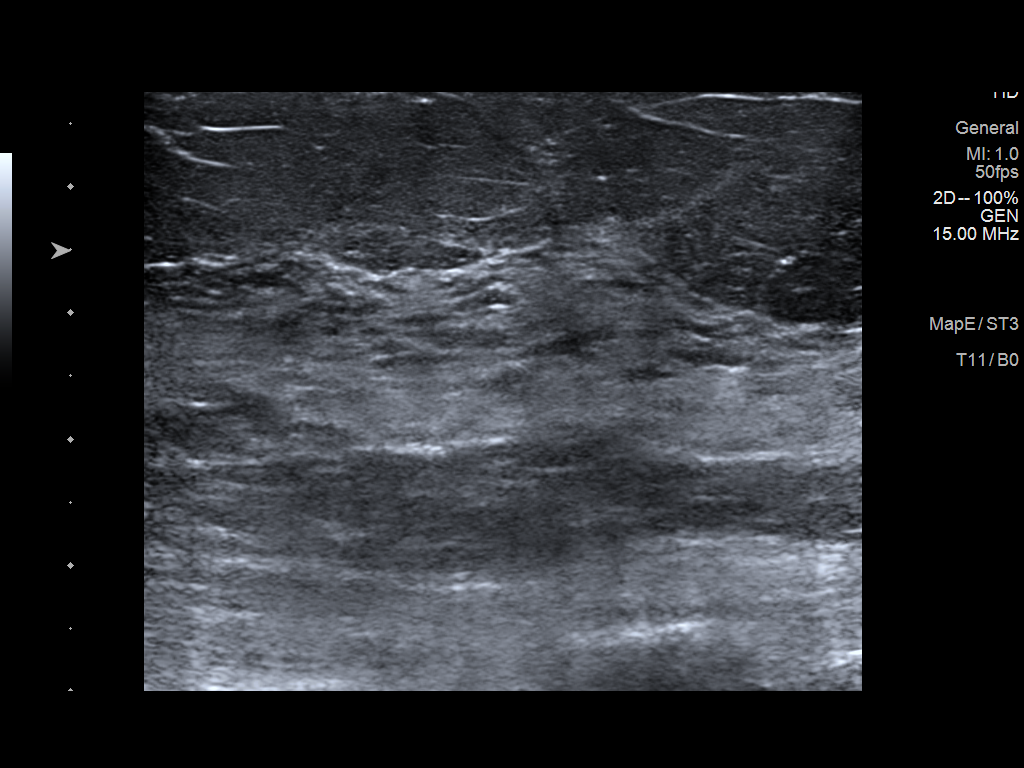

[2 of 2 positions shown; findings below may reference images not displayed]

ACR Breast Density Category c: The breast tissue is heterogeneously
dense, which may obscure small masses.
FINDINGS: No focal or suspicious mammographic findings are identified in
either breast. The parenchymal pattern is stable.

Targeted ultrasound is performed, showing no focal or suspicious
sonographic abnormalities. Evaluation of the entire upper outer
quadrant of the left breast was performed.
IMPRESSION: 1. No mammographic evidence of malignancy in either breast.
2. Unremarkable ultrasound evaluation of the left breast.

RECOMMENDATION:
1. Clinical follow-up recommended for the palpable area of concern
in the left breast. Any further workup should be based on clinical
grounds.
2.  Screening mammogram in one year.(Code:1E-J-GSF)

I have discussed the findings and recommendations with the patient.
If applicable, a reminder letter will be sent to the patient
regarding the next appointment.

BI-RADS CATEGORY  1: Negative.

## 2022-12-25 ENCOUNTER — Ambulatory Visit: Payer: Federal, State, Local not specified - PPO | Admitting: Physical Medicine and Rehabilitation

## 2022-12-25 ENCOUNTER — Other Ambulatory Visit: Payer: Self-pay

## 2022-12-25 DIAGNOSIS — M5416 Radiculopathy, lumbar region: Secondary | ICD-10-CM

## 2022-12-25 MED ORDER — METHYLPREDNISOLONE ACETATE 40 MG/ML IJ SUSP
40.0000 mg | Freq: Once | INTRAMUSCULAR | Status: AC
Start: 2022-12-25 — End: 2022-12-25
  Administered 2022-12-25: 40 mg

## 2022-12-25 NOTE — Patient Instructions (Signed)

## 2022-12-25 NOTE — Procedures (Signed)
Lumbar Epidural Steroid Injection - Interlaminar Approach with Fluoroscopic Guidance  Patient: Debbie Castro      Date of Birth: June 16, 1960 MRN: [id number] PCP: Jackelyn Poling, DO      Visit Date: 12/25/2022   Universal Protocol:     Consent Given By: the patient  Position: PRONE  Additional Comments: Vital signs were monitored before and after the procedure. Patient was prepped and draped in the usual sterile fashion. The correct patient, procedure, and site was verified.   Injection Procedure Details:   Procedure diagnoses: Lumbar radiculopathy [M54.16]   Meds Administered:  Meds ordered this encounter  Medications   methylPREDNISolone acetate (DEPO-MEDROL) injection 40 mg     Laterality: Right  Location/Site:  L5-S1  Needle: 3.5 in., 20 ga. Tuohy  Needle Placement: Paramedian epidural  Findings:   -Comments: Excellent flow of contrast into the epidural space.  Procedure Details: Using a paramedian approach from the side mentioned above, the region overlying the inferior lamina was localized under fluoroscopic visualization and the soft tissues overlying this structure were infiltrated with 4 ml. of 1% Lidocaine without Epinephrine. The Tuohy needle was inserted into the epidural space using a paramedian approach.   The epidural space was localized using loss of resistance along with counter oblique bi-planar fluoroscopic views.  After negative aspirate for air, blood, and CSF, a 2 ml. volume of Isovue-250 was injected into the epidural space and the flow of contrast was observed. Radiographs were obtained for documentation purposes.    The injectate was administered into the level noted above.   Additional Comments:  No complications occurred Dressing: 2 x 2 sterile gauze and Band-Aid    Post-procedure details: Patient was observed during the procedure. Post-procedure instructions were reviewed.  Patient left the clinic in stable condition.

## 2022-12-25 NOTE — Progress Notes (Signed)
Debbie Castro - 62 y.o. female MRN [id number]  Date of birth: 1960-11-05  Office Visit Note: Visit Date: 12/25/2022 PCP: Jackelyn Poling, DO Referred by: Jackelyn Poling, DO  Subjective: Chief Complaint  Patient presents with   Lower Back - Pain   HPI:  Debbie Castro is a 62 y.o. female who comes in today at the request of Ellin Goodie, FNP for planned Right L5-S1 Lumbar Interlaminar epidural steroid injection with fluoroscopic guidance.  The patient has failed conservative care including home exercise, medications, time and activity modification.  This injection will be diagnostic and hopefully therapeutic.  Please see requesting physician notes for further details and justification.   ROS Otherwise per HPI.  Assessment & Plan: Visit Diagnoses:    ICD-10-CM   1. Lumbar radiculopathy  M54.16 XR C-ARM NO REPORT    Epidural Steroid injection    methylPREDNISolone acetate (DEPO-MEDROL) injection 40 mg      Plan: No additional findings.   Meds & Orders:  Meds ordered this encounter  Medications   methylPREDNISolone acetate (DEPO-MEDROL) injection 40 mg    Orders Placed This Encounter  Procedures   XR C-ARM NO REPORT   Epidural Steroid injection    Follow-up: Return for visit to requesting provider as needed.   Procedures: No procedures performed  Lumbar Epidural Steroid Injection - Interlaminar Approach with Fluoroscopic Guidance  Patient: Debbie Castro      Date of Birth: February 05, 1960 MRN: [id number] PCP: Jackelyn Poling, DO      Visit Date: 12/25/2022   Universal Protocol:     Consent Given By: the patient  Position: PRONE  Additional Comments: Vital signs were monitored before and after the procedure. Patient was prepped and draped in the usual sterile fashion. The correct patient, procedure, and site was verified.   Injection Procedure Details:   Procedure diagnoses: Lumbar radiculopathy [M54.16]   Meds Administered:  Meds ordered this encounter   Medications   methylPREDNISolone acetate (DEPO-MEDROL) injection 40 mg     Laterality: Right  Location/Site:  L5-S1  Needle: 3.5 in., 20 ga. Tuohy  Needle Placement: Paramedian epidural  Findings:   -Comments: Excellent flow of contrast into the epidural space.  Procedure Details: Using a paramedian approach from the side mentioned above, the region overlying the inferior lamina was localized under fluoroscopic visualization and the soft tissues overlying this structure were infiltrated with 4 ml. of 1% Lidocaine without Epinephrine. The Tuohy needle was inserted into the epidural space using a paramedian approach.   The epidural space was localized using loss of resistance along with counter oblique bi-planar fluoroscopic views.  After negative aspirate for air, blood, and CSF, a 2 ml. volume of Isovue-250 was injected into the epidural space and the flow of contrast was observed. Radiographs were obtained for documentation purposes.    The injectate was administered into the level noted above.   Additional Comments:  No complications occurred Dressing: 2 x 2 sterile gauze and Band-Aid    Post-procedure details: Patient was observed during the procedure. Post-procedure instructions were reviewed.  Patient left the clinic in stable condition.   Clinical History: CLINICAL DATA:  Low back pain, symptoms persist with > 6 wks treatment. Lower back pain radiating into the right buttock and leg.   EXAM: MRI LUMBAR SPINE WITHOUT CONTRAST   TECHNIQUE: Multiplanar, multisequence MR imaging of the lumbar spine was performed. No intravenous contrast was administered.   COMPARISON:  Lumbar spine radiographs 09/17/2022.   FINDINGS: Segmentation: Conventional numbering is  assumed with 5 non-rib-bearing, lumbar type vertebral bodies.   Alignment:  Unchanged grade 1 anterolisthesis of L4 on L5.   Vertebrae:  No fracture, evidence of discitis, or bone lesion.   Conus  medullaris and cauda equina: Conus extends to the L1-2 level. Conus and cauda equina appear normal.   Paraspinal and other soft tissues: Mild fatty atrophy of the paraspinal muscles.   Disc levels:   T12-L1:  Normal.   L1-L2:  Normal.   L2-L3:  Normal.   L3-L4:  Normal.   L4-L5: Anterolisthesis with uncovered disc and left-greater-than-right facet arthropathy results in mild narrowing of the left lateral recess.   L5-S1:  Moderate left facet arthropathy.   IMPRESSION: 1. Unchanged grade 1 anterolisthesis of L4 on L5 with uncovered disc and left-greater-than-right facet arthropathy results in mild narrowing of the left lateral recess. 2. Moderate left facet arthropathy at L5-S1.     Electronically Signed   By: Orvan Falconer M.D.   On: 11/27/2022 10:12     Objective:  VS:  HT:    WT:   BMI:     BP:   HR: bpm  TEMP: ( )  RESP:  Physical Exam Vitals and nursing note reviewed.  Constitutional:      General: She is not in acute distress.    Appearance: Normal appearance. She is not ill-appearing.  HENT:     Head: Normocephalic and atraumatic.     Right Ear: External ear normal.     Left Ear: External ear normal.  Eyes:     Extraocular Movements: Extraocular movements intact.  Cardiovascular:     Rate and Rhythm: Normal rate.     Pulses: Normal pulses.  Pulmonary:     Effort: Pulmonary effort is normal. No respiratory distress.  Abdominal:     General: There is no distension.     Palpations: Abdomen is soft.  Musculoskeletal:        General: Tenderness present.     Cervical back: Neck supple.     Right lower leg: No edema.     Left lower leg: No edema.     Comments: Patient has good distal strength with no pain over the greater trochanters.  No clonus or focal weakness.  Skin:    Findings: No erythema, lesion or rash.  Neurological:     General: No focal deficit present.     Mental Status: She is alert and oriented to person, place, and time.      Sensory: No sensory deficit.     Motor: No weakness or abnormal muscle tone.     Coordination: Coordination normal.  Psychiatric:        Mood and Affect: Mood normal.        Behavior: Behavior normal.      Imaging: XR C-ARM NO REPORT  Result Date: 12/25/2022 Please see Notes tab for imaging impression.

## 2022-12-26 ENCOUNTER — Encounter (INDEPENDENT_AMBULATORY_CARE_PROVIDER_SITE_OTHER): Payer: Self-pay | Admitting: Family Medicine

## 2022-12-26 ENCOUNTER — Ambulatory Visit (INDEPENDENT_AMBULATORY_CARE_PROVIDER_SITE_OTHER): Payer: Federal, State, Local not specified - PPO | Admitting: Family Medicine

## 2022-12-26 VITALS — BP 111/74 | HR 79 | Temp 98.2°F | Ht 63.0 in | Wt 180.0 lb

## 2022-12-26 DIAGNOSIS — E669 Obesity, unspecified: Secondary | ICD-10-CM | POA: Diagnosis not present

## 2022-12-26 DIAGNOSIS — Z6831 Body mass index (BMI) 31.0-31.9, adult: Secondary | ICD-10-CM

## 2022-12-26 DIAGNOSIS — Z7985 Long-term (current) use of injectable non-insulin antidiabetic drugs: Secondary | ICD-10-CM

## 2022-12-26 DIAGNOSIS — E1159 Type 2 diabetes mellitus with other circulatory complications: Secondary | ICD-10-CM | POA: Diagnosis not present

## 2022-12-26 DIAGNOSIS — E1165 Type 2 diabetes mellitus with hyperglycemia: Secondary | ICD-10-CM

## 2022-12-26 DIAGNOSIS — I152 Hypertension secondary to endocrine disorders: Secondary | ICD-10-CM | POA: Insufficient documentation

## 2022-12-26 MED ORDER — SEMAGLUTIDE (2 MG/DOSE) 8 MG/3ML ~~LOC~~ SOPN
2.0000 mg | PEN_INJECTOR | SUBCUTANEOUS | 0 refills | Status: DC
Start: 2022-12-26 — End: 2023-02-04

## 2022-12-26 MED ORDER — LISINOPRIL 5 MG PO TABS
5.0000 mg | ORAL_TABLET | Freq: Every day | ORAL | 0 refills | Status: DC
Start: 2022-12-26 — End: 2023-03-12

## 2022-12-26 NOTE — Progress Notes (Signed)
SUBJECTIVE:  Chief Complaint: Obesity  Interim History: Patient celebrated Thanksgiving down the street at her sister's house.  She did do some indulgent eating.  She is planning to retire on December 28th.  She is apprehensive about what not working will be like.  She is getting tired of sandwiches at lunch.  She occasionally is making substitutions on the plan.  She mentions she does not have issues with breakfast.  Dinner she and her husband are doing their best in terms of dinner quantity and content.  Her parents are having a party at their house and then she and her husband are hosting a Christmas party for his family.  Debbie Castro is here to discuss her progress with her obesity treatment plan. She is on the Category 2 Plan and states she is following her eating plan approximately 50 % of the time. She states she is exercising 45 minutes 4 times per week.   OBJECTIVE: Visit Diagnoses: Problem List Items Addressed This Visit       Cardiovascular and Mediastinum   Hypertension associated with diabetes (HCC) - Primary   On lisinopril 5mg  daily. No chest pain, chest pressure and headache.  Needs refill of lisinopril today.        Relevant Medications   lisinopril (ZESTRIL) 5 MG tablet   Semaglutide, 2 MG/DOSE, 8 MG/3ML SOPN     Endocrine   Type 2 diabetes mellitus with hyperglycemia, without long-term current use of insulin (HCC)   Patient doing well on Ozempic.  Her last A1c in epic was 6.0.  She denies any GI side effects of ozempic or metformin. She needs a refill of Ozempic today. Will follow up on labs at next appointment.      Relevant Medications   lisinopril (ZESTRIL) 5 MG tablet   Semaglutide, 2 MG/DOSE, 8 MG/3ML SOPN   Other Visit Diagnoses       Obesity with starting BMI of 40.0       Relevant Medications   Semaglutide, 2 MG/DOSE, 8 MG/3ML SOPN     BMI 31.0-31.9,adult           Vitals Temp: 98.2 F (36.8 C) BP: 111/74 Pulse Rate: 79 SpO2: 97  %   Anthropometric Measurements Height: 5\' 3"  (1.6 m) Weight: 180 lb (81.6 kg) BMI (Calculated): 31.89 Weight at Last Visit: 183 lb Weight Lost Since Last Visit: 3 Weight Gained Since Last Visit: 0 Starting Weight: 226 lb Total Weight Loss (lbs): 46 lb (20.9 kg)   Body Composition  Body Fat %: 42.4 % Fat Mass (lbs): 76.4 lbs Muscle Mass (lbs): 98.6 lbs Total Body Water (lbs): 69.6 lbs Visceral Fat Rating : 11   Other Clinical Data Today's Visit #: 30 Starting Date: 07/15/20     ASSESSMENT AND PLAN:  Diet: Debbie Castro is currently in the action stage of change. As such, her goal is to continue with weight loss efforts. She has agreed to Category 2 Plan.  Exercise: Debbie Castro has been instructed that some exercise is better than none and to continue exercising as is for weight loss and overall health benefits.   Behavior Modification:  We discussed the following Behavioral Modification Strategies today: increasing lean protein intake, increasing vegetables, meal planning and cooking strategies, better snacking choices, holiday eating strategies, and planning for success.   No follow-ups on file.Marland Kitchen She was informed of the importance of frequent follow up visits to maximize her success with intensive lifestyle modifications for her multiple health conditions.  Attestation Statements:  Reviewed by clinician on day of visit: allergies, medications, problem list, medical history, surgical history, family history, social history, and previous encounter notes.    [name], MD

## 2022-12-26 NOTE — Assessment & Plan Note (Signed)
On lisinopril 5mg  daily. No chest pain, chest pressure and headache.  Needs refill of lisinopril today.

## 2022-12-26 NOTE — Assessment & Plan Note (Signed)
Patient doing well on Ozempic.  Her last A1c in epic was 6.0.  She denies any GI side effects of ozempic or metformin. She needs a refill of Ozempic today. Will follow up on labs at next appointment.

## 2022-12-27 DIAGNOSIS — E119 Type 2 diabetes mellitus without complications: Secondary | ICD-10-CM | POA: Diagnosis not present

## 2022-12-27 DIAGNOSIS — Z0000 Encounter for general adult medical examination without abnormal findings: Secondary | ICD-10-CM | POA: Diagnosis not present

## 2022-12-27 DIAGNOSIS — F419 Anxiety disorder, unspecified: Secondary | ICD-10-CM | POA: Diagnosis not present

## 2022-12-27 DIAGNOSIS — E785 Hyperlipidemia, unspecified: Secondary | ICD-10-CM | POA: Diagnosis not present

## 2022-12-27 DIAGNOSIS — I10 Essential (primary) hypertension: Secondary | ICD-10-CM | POA: Diagnosis not present

## 2022-12-27 LAB — BASIC METABOLIC PANEL
BUN: 33 — AB (ref 4–21)
CO2: 26 — AB (ref 13–22)
Chloride: 105 (ref 99–108)
Creatinine: 0.9 (ref 0.5–1.1)
Glucose: 103
Potassium: 4.5 meq/L (ref 3.5–5.1)
Sodium: 143 (ref 137–147)

## 2022-12-27 LAB — LIPID PANEL
Cholesterol: 101 (ref 0–200)
HDL: 43 (ref 35–70)
LDL Cholesterol: 43
Triglycerides: 69 (ref 40–160)

## 2022-12-27 LAB — COMPREHENSIVE METABOLIC PANEL
Calcium: 9.2 (ref 8.7–10.7)
EGFR: 70

## 2022-12-27 LAB — HEPATIC FUNCTION PANEL
ALT: 14 U/L (ref 7–35)
AST: 13 (ref 13–35)
Alkaline Phosphatase: 62 (ref 25–125)
Bilirubin, Total: 0.3

## 2022-12-27 LAB — HEMOGLOBIN A1C: Hemoglobin A1C: 5.9

## 2022-12-31 DIAGNOSIS — L821 Other seborrheic keratosis: Secondary | ICD-10-CM | POA: Diagnosis not present

## 2022-12-31 DIAGNOSIS — B351 Tinea unguium: Secondary | ICD-10-CM | POA: Diagnosis not present

## 2022-12-31 DIAGNOSIS — C44519 Basal cell carcinoma of skin of other part of trunk: Secondary | ICD-10-CM | POA: Diagnosis not present

## 2022-12-31 DIAGNOSIS — Z85828 Personal history of other malignant neoplasm of skin: Secondary | ICD-10-CM | POA: Diagnosis not present

## 2022-12-31 DIAGNOSIS — L570 Actinic keratosis: Secondary | ICD-10-CM | POA: Diagnosis not present

## 2023-01-06 DIAGNOSIS — E119 Type 2 diabetes mellitus without complications: Secondary | ICD-10-CM | POA: Diagnosis not present

## 2023-01-16 ENCOUNTER — Ambulatory Visit: Payer: Federal, State, Local not specified - PPO | Admitting: Physical Medicine and Rehabilitation

## 2023-01-16 ENCOUNTER — Encounter: Payer: Self-pay | Admitting: Physical Medicine and Rehabilitation

## 2023-01-16 VITALS — BP 95/65 | HR 78

## 2023-01-16 DIAGNOSIS — G8929 Other chronic pain: Secondary | ICD-10-CM

## 2023-01-16 DIAGNOSIS — M5441 Lumbago with sciatica, right side: Secondary | ICD-10-CM | POA: Diagnosis not present

## 2023-01-16 DIAGNOSIS — M4316 Spondylolisthesis, lumbar region: Secondary | ICD-10-CM | POA: Diagnosis not present

## 2023-01-16 DIAGNOSIS — M5416 Radiculopathy, lumbar region: Secondary | ICD-10-CM

## 2023-01-16 NOTE — Progress Notes (Signed)
 XOE HOE - 63 y.o. female MRN [id number]  Date of birth: 1960-07-20  Office Visit Note: Visit Date: 01/16/2023 PCP: Dayna Motto, DO Referred by: Dayna Motto, DO  Subjective: Chief Complaint  Patient presents with   Right Hip - Pain   HPI: Debbie Castro is a 63 y.o. female who comes in today for evaluation of chronic right sided lower back pain radiating to buttock and down posterior thigh to knee. She is here today in follow from previous right L5-S1 interlaminar epidural steroid injection on 12/25/2022. Pain ongoing for several months, worsens with sitting. She describes pain as sore and aching sensation, currently denies pain at this time.  Some relief of pain with home exercise regimen, rest and use of medications. History of recent formal physical therapy/dry needling that caused her pain to be more severe. Recent lumbar MRI imaging exhibits grade 1 anterolisthesis of L4 on L5, mild narrowing of left lateral recess at this level. Moderate left facet arthropathy at L5-S1. No significant nerve impingement, no high grade spinal canal stenosis noted. Recently underwent right L5-S1 interlaminar epidural steroid injection in our office on 12/11, she reports significant and sustained relief of pain with this procedure. Greater than 50%. Also reports increased functional ability. 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 Patient denies focal weakness, numbness and tingling. No recent trauma or falls.      Review of Systems  Musculoskeletal:  Negative for back pain.  Neurological:  Negative for tingling, sensory change, focal weakness and weakness.  All other systems reviewed and are negative.  Otherwise per HPI.  Assessment & Plan: Visit Diagnoses:    ICD-10-CM   1. Chronic right-sided low back pain with right-sided sciatica  M54.41    G89.29     2. Lumbar radiculopathy  M54.16     3. Anterolisthesis  of lumbar spine  M43.16        Plan: Findings:  Chronic right sided lower back pain radiating to buttock and down posterior thigh to knee. Patient is pain free at this time post lumbar epidural steroid injection. She continues with conservative therapies such as home exercise regimen, rest and use of medications. We can repeat lumbar epidural steroid injection infrequently as needed. We will continue to monitor. I encouraged patient to remain active as tolerated, to stand up frequently if sitting for long periods of time. I instructed patient to call should her pain return. She has no questions at this time. No red flag symptoms noted upon exam today.     Meds & Orders: No orders of the defined types were placed in this encounter.  No orders of the defined types were placed in this encounter.   Follow-up: Return if symptoms worsen or fail to improve.   Procedures: No procedures performed      Clinical History: CLINICAL DATA:  Low back pain, symptoms persist with > 6 wks treatment. Lower back pain radiating into the right buttock and leg.   EXAM: MRI LUMBAR SPINE WITHOUT CONTRAST   TECHNIQUE: Multiplanar, multisequence MR imaging of the lumbar spine was performed. No intravenous contrast was administered.   COMPARISON:  Lumbar spine radiographs 09/17/2022.   FINDINGS: Segmentation: Conventional numbering is assumed with 5 non-rib-bearing, lumbar type vertebral bodies.   Alignment:  Unchanged grade 1 anterolisthesis of L4 on L5.   Vertebrae:  No fracture, evidence of discitis, or bone lesion.   Conus medullaris and  cauda equina: Conus extends to the L1-2 level. Conus and cauda equina appear normal.   Paraspinal and other soft tissues: Mild fatty atrophy of the paraspinal muscles.   Disc levels:   T12-L1:  Normal.   L1-L2:  Normal.   L2-L3:  Normal.   L3-L4:  Normal.   L4-L5: Anterolisthesis with uncovered disc and left-greater-than-right facet arthropathy results  in mild narrowing of the left lateral recess.   L5-S1:  Moderate left facet arthropathy.   IMPRESSION: 1. Unchanged grade 1 anterolisthesis of L4 on L5 with uncovered disc and left-greater-than-right facet arthropathy results in mild narrowing of the left lateral recess. 2. Moderate left facet arthropathy at L5-S1.     Electronically Signed   By: Ryan Chess M.D.   On: 11/27/2022 10:12   She reports that she has never smoked. She has never been exposed to tobacco smoke. She has never used smokeless tobacco.  Recent Labs    07/23/22 0000  HGBA1C 6.0    Objective:  VS:  HT:    WT:   BMI:     BP:95/65  HR:78bpm  TEMP: ( )  RESP:  Physical Exam Vitals and nursing note reviewed.  HENT:     Head: Normocephalic and atraumatic.     Right Ear: External ear normal.     Left Ear: External ear normal.     Nose: Nose normal.     Mouth/Throat:     Mouth: Mucous membranes are moist.  Eyes:     Extraocular Movements: Extraocular movements intact.  Cardiovascular:     Rate and Rhythm: Normal rate.     Pulses: Normal pulses.  Pulmonary:     Effort: Pulmonary effort is normal.  Abdominal:     General: Abdomen is flat. There is no distension.  Musculoskeletal:        General: No tenderness.     Cervical back: Normal range of motion.     Comments: Patient rises from seated position to standing without difficulty. Good lumbar range of motion. No pain noted with facet loading. 5/5 strength noted with bilateral hip flexion, knee flexion/extension, ankle dorsiflexion/plantarflexion and EHL. No clonus noted bilaterally. No pain upon palpation of greater trochanters. No pain with internal/external rotation of bilateral hips. Sensation intact bilaterally. Negative slump test bilaterally. Ambulates without aid, gait steady.         Skin:    General: Skin is warm and dry.     Capillary Refill: Capillary refill takes less than 2 seconds.  Neurological:     General: No focal deficit  present.     Mental Status: She is alert and oriented to person, place, and time.  Psychiatric:        Mood and Affect: Mood normal.        Behavior: Behavior normal.     Ortho Exam  Imaging: No results found.  Past Medical/Family/Surgical/Social History: Medications & Allergies reviewed per EMR, new medications updated. Patient Active Problem List   Diagnosis Date Noted   Hypertension associated with diabetes (HCC) 12/26/2022   Type 2 diabetes mellitus with hyperglycemia, without long-term current use of insulin  (HCC) 05/17/2021   Vitamin D  deficiency 05/17/2021   Trochanteric bursitis of left hip 04/02/2018   Past Medical History:  Diagnosis Date   Allergy    mold.seasonal   Anxiety    occasional   Asthma    Back pain    Bilateral swelling of feet and ankles    Constipation    Diabetes mellitus  without complication (HCC)    prediabetic per PCP   HSV-1 infection    Fever blisters.   Hyperlipidemia    Hypertension    Joint pain    Kidney problem    Other fatigue    Prediabetes    Shortness of breath on exertion    Vitamin D  deficiency    Family History  Problem Relation Age of Onset   Anxiety disorder Mother    Stroke Mother    Glaucoma Mother        loss of vision left eye   Anxiety disorder Father    Hyperlipidemia Father    Hypertension Father    Stroke Father    Cancer Maternal Grandfather    Anxiety disorder Other    Colon cancer Neg Hx    Esophageal cancer Neg Hx    Stomach cancer Neg Hx    Breast cancer Neg Hx    Colon polyps Neg Hx    Rectal cancer Neg Hx    Past Surgical History:  Procedure Laterality Date   ANKLE SURGERY  01/15/1996   right   COLONOSCOPY     TOTAL ABDOMINAL HYSTERECTOMY  01/15/2003   still has ovaries   WISDOM TOOTH EXTRACTION  01/15/1980   Social History   Occupational History   Occupation: Airline Pilot for [employer]    Employer: [employer]  Tobacco Use   Smoking status: Never    Passive exposure:  Never   Smokeless tobacco: Never  Vaping Use   Vaping status: Never Used  Substance and Sexual Activity   Alcohol use: Yes    Comment: 2-3 drinks per year   Drug use: Never   Sexual activity: Yes    Partners: Male    Birth control/protection: Surgical

## 2023-01-16 NOTE — Progress Notes (Signed)
 Functional Pain Scale - descriptive words and definitions  Mild (2)   Noticeable when not distracted/no impact on ADL's/sleep only slightly affected and able to   use both passive and active distraction for comfort. Mild range order  Average Pain 2  She does think that the injection has helped but still having some pain in the right hip.

## 2023-02-04 ENCOUNTER — Ambulatory Visit (INDEPENDENT_AMBULATORY_CARE_PROVIDER_SITE_OTHER): Payer: Federal, State, Local not specified - PPO | Admitting: Family Medicine

## 2023-02-04 ENCOUNTER — Encounter (INDEPENDENT_AMBULATORY_CARE_PROVIDER_SITE_OTHER): Payer: Self-pay | Admitting: Family Medicine

## 2023-02-04 VITALS — BP 123/84 | HR 76 | Temp 97.9°F | Ht 63.0 in | Wt 180.0 lb

## 2023-02-04 DIAGNOSIS — E1169 Type 2 diabetes mellitus with other specified complication: Secondary | ICD-10-CM | POA: Diagnosis not present

## 2023-02-04 DIAGNOSIS — Z6831 Body mass index (BMI) 31.0-31.9, adult: Secondary | ICD-10-CM

## 2023-02-04 DIAGNOSIS — E669 Obesity, unspecified: Secondary | ICD-10-CM

## 2023-02-04 DIAGNOSIS — E785 Hyperlipidemia, unspecified: Secondary | ICD-10-CM | POA: Diagnosis not present

## 2023-02-04 DIAGNOSIS — Z7985 Long-term (current) use of injectable non-insulin antidiabetic drugs: Secondary | ICD-10-CM

## 2023-02-04 DIAGNOSIS — E1165 Type 2 diabetes mellitus with hyperglycemia: Secondary | ICD-10-CM

## 2023-02-04 MED ORDER — SEMAGLUTIDE (2 MG/DOSE) 8 MG/3ML ~~LOC~~ SOPN
2.0000 mg | PEN_INJECTOR | SUBCUTANEOUS | 0 refills | Status: DC
Start: 2023-02-04 — End: 2023-03-12

## 2023-02-04 NOTE — Assessment & Plan Note (Signed)
Patient on semaglutide and metformin.  Recent A1c of 5.9 with PCP.  Not sure how many injections she has left.  Needs refill of ozempic today.  Continue current dose no change in medication or dose as patient is doing well with food intake.

## 2023-02-04 NOTE — Assessment & Plan Note (Signed)
Patient went up to full pill for statin accidentally.  She has backed down to half a tablet since recent blood work.  Recent labs from PCP showing total cholesterol 101, HDL 43, Trig 69 and LDL 43.  Patient taking 20mg  of crestor.  Repeat labs in 4 months to check levels on lower statin dose.

## 2023-02-04 NOTE — Progress Notes (Unsigned)
   SUBJECTIVE:  Chief Complaint: Obesity  Interim History: Patient had a good holiday season- she retired on 01/11/23.  She did some work for the week after her official retirement.  She is working on the mental aspect of retiring and doesn't feel really retired yet. She is still trying to figure out what her normal routine will be now that she is retired.  She and her husband are going to the gym daily but trying to figure out what time to go.  During Christmas holidays patient realizes she likely over indulged in sweets.  She is doing better with her food intake over the last few weeks.  She is watching videos on healthier options and recipes and cooking. Nothing planned for the next few weeks.  Debbie Castro is here to discuss her progress with her obesity treatment plan. She is on the Category 2 Plan and states she is following her eating plan approximately 50 % of the time. She states she is exercising 45-60 minutes 5 times per week.   OBJECTIVE: Visit Diagnoses: Problem List Items Addressed This Visit   None   No data recorded Anthropometric Measurements Height: 5\' 3"  (1.6 m) Weight at Last Visit: 180 lb Starting Weight: 226 lb   No data recorded Other Clinical Data Today's Visit #: 31 Starting Date: 07/15/20     ASSESSMENT AND PLAN:  Diet: Debbie Castro is currently in the action stage of change. As such, her goal is to {HWW Weight Loss Efforts:[id number]}. She has agreed to keeping a food journal and adhering to recommended goals of 200-250 calories/20+g Breakfast, 300-350 calories/25+g lunch and 400-450calories/ 35+g dinner and or Category 2.  Exercise: Debbie Castro has been instructed to continue exercising as is for weight loss and overall health benefits.   Behavior Modification:  We discussed the following Behavioral Modification Strategies today: increasing lean protein intake, increasing vegetables, meal planning and cooking strategies, keeping healthy foods in the home, and better  snacking choices.   No follow-ups on file.Marland Kitchen She was informed of the importance of frequent follow up visits to maximize her success with intensive lifestyle modifications for her multiple health conditions.  Attestation Statements:   Reviewed by clinician on day of visit: allergies, medications, problem list, medical history, surgical history, family history, social history, and previous encounter notes.     Debbie Likes, MD

## 2023-02-06 ENCOUNTER — Ambulatory Visit: Payer: Federal, State, Local not specified - PPO | Admitting: Podiatry

## 2023-02-06 DIAGNOSIS — Q828 Other specified congenital malformations of skin: Secondary | ICD-10-CM

## 2023-02-06 DIAGNOSIS — B351 Tinea unguium: Secondary | ICD-10-CM | POA: Diagnosis not present

## 2023-02-06 MED ORDER — EFINACONAZOLE 10 % EX SOLN
1.0000 [drp] | Freq: Every day | CUTANEOUS | 11 refills | Status: AC
Start: 2023-02-06 — End: ?

## 2023-02-06 NOTE — Progress Notes (Signed)
Subjective: Chief Complaint  Patient presents with   Nail Problem    RM#14 Left big toe nail issues wants to discuss previous treatment issues have returned.Skin lesions have returned not as bad but are present.   63 year old female presents the office today above concerns.  She states that she doing well but the skin lesions in the left foot are starting to come back and causing discomfort.  She does have a compound that I ordered previously but does not seem to be helping.  No lesions or any drainage.  She also wants to discuss her left big toenail.  She said that she had fungus previously and she was prescribed Jublia and the symptoms resolved however does come back.  Objective: AAO x3, NAD DP/PT pulses palpable bilaterally, CRT less than 3 seconds Left foot there is 6 punctate annular hyperkeratotic lesions.  Upon debridement there is no underlying ulceration, drainage or any signs of infection.  There is no open lesions. Left hallux toenail is hypertrophic and dystrophic with yellow discoloration.  There is no edema, erythema or signs of infection. No pain with calf compression, swelling, warmth, erythema  Assessment: Skin lesions left foot, onychomycosis  Plan: -All treatment options discussed with the patient including all alternatives, risks, complications.   Skin lesions, left foot -Sharply debrided lesions on left foot x 6 without any complications or bleeding.  Cantharone is applied followed by occlusive bandage.  Post pain instructions discussed.  Monitor for any signs or symptoms of infection. -We discussed using a pumice stone or file to try to remove some of the callus prior to using the compound medication.  Discussed moisturizer as well.  Onychomycosis -Prescribed Jublia for nail fungus  Vivi Barrack DPM

## 2023-02-06 NOTE — Patient Instructions (Signed)
Take dressing off in 8 hours and wash the foot with soap and water. If it is hurting or becomes uncomfortable before the 8 hours, go ahead and remove the bandage and wash the area.  If it blisters, apply antibiotic ointment and a band-aid.  Monitor for any signs/symptoms of infection. Call the office immediately if any occur or go directly to the emergency room. Call with any questions/concerns.   

## 2023-03-12 ENCOUNTER — Ambulatory Visit (INDEPENDENT_AMBULATORY_CARE_PROVIDER_SITE_OTHER): Payer: Federal, State, Local not specified - PPO | Admitting: Family Medicine

## 2023-03-12 ENCOUNTER — Encounter (INDEPENDENT_AMBULATORY_CARE_PROVIDER_SITE_OTHER): Payer: Self-pay | Admitting: Family Medicine

## 2023-03-12 VITALS — BP 152/82 | HR 76 | Temp 98.0°F | Ht 63.0 in | Wt 181.0 lb

## 2023-03-12 DIAGNOSIS — Z6832 Body mass index (BMI) 32.0-32.9, adult: Secondary | ICD-10-CM

## 2023-03-12 DIAGNOSIS — E1159 Type 2 diabetes mellitus with other circulatory complications: Secondary | ICD-10-CM

## 2023-03-12 DIAGNOSIS — E1165 Type 2 diabetes mellitus with hyperglycemia: Secondary | ICD-10-CM | POA: Diagnosis not present

## 2023-03-12 DIAGNOSIS — I152 Hypertension secondary to endocrine disorders: Secondary | ICD-10-CM

## 2023-03-12 DIAGNOSIS — Z7984 Long term (current) use of oral hypoglycemic drugs: Secondary | ICD-10-CM

## 2023-03-12 DIAGNOSIS — Z7985 Long-term (current) use of injectable non-insulin antidiabetic drugs: Secondary | ICD-10-CM

## 2023-03-12 MED ORDER — METFORMIN HCL ER 500 MG PO TB24
1000.0000 mg | ORAL_TABLET | Freq: Every day | ORAL | 0 refills | Status: DC
Start: 2023-03-12 — End: 2023-06-02

## 2023-03-12 MED ORDER — LISINOPRIL 5 MG PO TABS
5.0000 mg | ORAL_TABLET | Freq: Every day | ORAL | 0 refills | Status: DC
Start: 2023-03-12 — End: 2023-09-01

## 2023-03-12 MED ORDER — SEMAGLUTIDE (2 MG/DOSE) 8 MG/3ML ~~LOC~~ SOPN
2.0000 mg | PEN_INJECTOR | SUBCUTANEOUS | 0 refills | Status: DC
Start: 2023-03-12 — End: 2023-04-23

## 2023-03-12 NOTE — Assessment & Plan Note (Signed)
 Patient BP elevated today.  Prior blood pressures well controlled and all her blood pressures prior have been within normal limits.  No chest pain, chest pressure or headache.  She is on lisinopril 5mg .  No  change in medications today and will follow up on BP at next appointment.

## 2023-03-12 NOTE — Assessment & Plan Note (Signed)
 Starting BMI of 40; starting weight of 226lb.  She is now at 181 and doing well on ozempic for her diabetes.  Will continue to work on staying within [employer] and achieving protein goals.

## 2023-03-12 NOTE — Assessment & Plan Note (Signed)
 Patient is taking about 1.5mg  of Ozempic weekly (halfway between 1 and 2).  She is having good craving control and food intake control but thinks she is boredom eating in the afternoon. Recent A1c of 5.9 in December 2024.  She needs a refill of metformin and ozempic today.

## 2023-03-12 NOTE — Progress Notes (Signed)
 SUBJECTIVE:  Chief Complaint: Obesity  Interim History: Patient is getting into a routine now she is retired.  She thinks she may have eaten too many calories.  She thinks she needs to do a better job at actually counting calories and portions. Patient feels like she does stuff everyday but time has been getting away from her.   Debbie Castro is here to discuss her progress with her obesity treatment plan. She is on the Category 2 Plan and jounaling (200-250/20 grams for breakfast, 300-350/25+ grams for lunch, and 400-450/35+ grams of protein) and states she is following her eating plan approximately 50 % of the time. She states she is exercising 60 minutes 5 times per week.   OBJECTIVE: Visit Diagnoses: Problem List Items Addressed This Visit       Cardiovascular and Mediastinum   Hypertension associated with diabetes (HCC) - Primary   Patient BP elevated today.  Prior blood pressures well controlled and all her blood pressures prior have been within normal limits.  No chest pain, chest pressure or headache.  She is on lisinopril 5mg .  No  change in medications today and will follow up on BP at next appointment.      Relevant Medications   lisinopril (ZESTRIL) 5 MG tablet   metFORMIN (GLUCOPHAGE-XR) 500 MG 24 hr tablet   Semaglutide, 2 MG/DOSE, 8 MG/3ML SOPN     Endocrine   Type 2 diabetes mellitus with hyperglycemia, without long-term current use of insulin (HCC)   Patient is taking about 1.5mg  of Ozempic weekly (halfway between 1 and 2).  She is having good craving control and food intake control but thinks she is boredom eating in the afternoon. Recent A1c of 5.9 in December 2024.  She needs a refill of metformin and ozempic today.      Relevant Medications   lisinopril (ZESTRIL) 5 MG tablet   metFORMIN (GLUCOPHAGE-XR) 500 MG 24 hr tablet   Semaglutide, 2 MG/DOSE, 8 MG/3ML SOPN     Other   Morbid obesity (HCC)   Starting BMI of 40; starting weight of 226lb.  She is now at 181 and  doing well on ozempic for her diabetes.  Will continue to work on staying within [employer] and achieving protein goals.      Relevant Medications   metFORMIN (GLUCOPHAGE-XR) 500 MG 24 hr tablet   Semaglutide, 2 MG/DOSE, 8 MG/3ML SOPN    Vitals Temp: 98 F (36.7 C) BP: (!) 152/82 Pulse Rate: 76 SpO2: 98 %   Anthropometric Measurements Height: 5\' 3"  (1.6 m) Weight: 181 lb (82.1 kg) BMI (Calculated): 32.07 Weight at Last Visit: 180 lb Weight Lost Since Last Visit: 0 Weight Gained Since Last Visit: 1 Starting Weight: 226 lb Total Weight Loss (lbs): 45 lb (20.4 kg)   Body Composition  Body Fat %: 43.2 % Fat Mass (lbs): 78.4 lbs Muscle Mass (lbs): 97.8 lbs Total Body Water (lbs): 71 lbs Visceral Fat Rating : 12   Other Clinical Data Fasting: no Labs: no Today's Visit #: 32 Starting Date: 07/15/20 Comments: Cat 2, Journal     ASSESSMENT AND PLAN:  Diet: [name] is currently in the action stage of change. As such, her goal is to continue with weight loss efforts and has agreed to the Category 2 Plan and keeping a food journal and adhering to recommended goals of 1150-1250 calories and 80 grams or more of protein daily.   Exercise:  Adults should also include muscle-strengthening activities that involve all major muscle groups  on 2 or more days a week.  Behavior Modification:  We discussed the following Behavioral Modification Strategies today: increasing lean protein intake, increasing vegetables, no skipping meals, meal planning and cooking strategies, ways to avoid boredom eating, and emotional eating strategies .   Return in about 5 weeks (around 04/16/2023).Marland Kitchen She was informed of the importance of frequent follow up visits to maximize her success with intensive lifestyle modifications for her multiple health conditions.  Attestation Statements:   Reviewed by clinician on day of visit: allergies, medications, problem list, medical history, surgical history,  family history, social history, and previous encounter notes.     [name], MD

## 2023-04-18 DIAGNOSIS — S46811A Strain of other muscles, fascia and tendons at shoulder and upper arm level, right arm, initial encounter: Secondary | ICD-10-CM | POA: Diagnosis not present

## 2023-04-23 ENCOUNTER — Encounter (INDEPENDENT_AMBULATORY_CARE_PROVIDER_SITE_OTHER): Payer: Self-pay | Admitting: Family Medicine

## 2023-04-23 ENCOUNTER — Ambulatory Visit (INDEPENDENT_AMBULATORY_CARE_PROVIDER_SITE_OTHER): Payer: Federal, State, Local not specified - PPO | Admitting: Family Medicine

## 2023-04-23 VITALS — BP 120/68 | HR 74 | Temp 98.1°F | Ht 63.0 in | Wt 177.0 lb

## 2023-04-23 DIAGNOSIS — E1165 Type 2 diabetes mellitus with hyperglycemia: Secondary | ICD-10-CM

## 2023-04-23 DIAGNOSIS — E785 Hyperlipidemia, unspecified: Secondary | ICD-10-CM | POA: Diagnosis not present

## 2023-04-23 DIAGNOSIS — Z7984 Long term (current) use of oral hypoglycemic drugs: Secondary | ICD-10-CM

## 2023-04-23 DIAGNOSIS — E1169 Type 2 diabetes mellitus with other specified complication: Secondary | ICD-10-CM | POA: Diagnosis not present

## 2023-04-23 DIAGNOSIS — Z6831 Body mass index (BMI) 31.0-31.9, adult: Secondary | ICD-10-CM

## 2023-04-23 DIAGNOSIS — Z7985 Long-term (current) use of injectable non-insulin antidiabetic drugs: Secondary | ICD-10-CM

## 2023-04-23 MED ORDER — SEMAGLUTIDE (2 MG/DOSE) 8 MG/3ML ~~LOC~~ SOPN
2.0000 mg | PEN_INJECTOR | SUBCUTANEOUS | 0 refills | Status: DC
Start: 2023-04-23 — End: 2023-06-02

## 2023-04-23 NOTE — Assessment & Plan Note (Addendum)
 Patient doing well on Ozempic and Metformin with no side effects of the 2mg  dose.  Needs a refill today of Ozempic but not metformin.

## 2023-04-23 NOTE — Progress Notes (Signed)
 SUBJECTIVE:  Chief Complaint: Obesity  Interim History: Since last appointment she has been staying mostly at home and enjoying retirement.  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  She tends to snack in the afternoon and incorporates more snacks prior to dinner and that tends to be the most difficult time of the day to control her intake quantity.  She feels like if she can find something to do she gets busy enough to control her intake.  Debbie Castro is here to discuss her progress with her obesity treatment plan. She is on the Category 2 Plan and states she is following her eating plan approximately 50 % of the time. She states she is exercising 60 minutes 5 times per week.   OBJECTIVE: Visit Diagnoses: Problem List Items Addressed This Visit       Endocrine   Type 2 diabetes mellitus with hyperglycemia, without long-term current use of insulin (HCC)   Patient doing well on Ozempic and Metformin with no side effects of the 2mg  dose.  Needs a refill today of Ozempic but not metformin.      Relevant Medications   Semaglutide, 2 MG/DOSE, 8 MG/3ML SOPN   Hyperlipidemia associated with type 2 diabetes mellitus (HCC) - Primary   On crestor with no myalgias or transaminitis.  No refill needed today.      Relevant Medications   Semaglutide, 2 MG/DOSE, 8 MG/3ML SOPN     Other   Morbid obesity (HCC)   Relevant Medications   Semaglutide, 2 MG/DOSE, 8 MG/3ML SOPN   Other Visit Diagnoses       BMI 31.0-31.9,adult           Vitals Temp: 98.1 F (36.7 C) BP: 120/68 Pulse Rate: 74 SpO2: 97 %   Anthropometric Measurements Height: 5\' 3"  (1.6 m) Weight: 177 lb (80.3 kg) BMI (Calculated): 31.36 Weight at Last Visit: 181 lb Weight Lost Since Last Visit: 4 Weight Gained Since Last Visit: 0 Starting Weight: 226 lb Total Weight Loss (lbs): 49 lb (22.2 kg)   Body Composition  Body Fat %: 41.2 % Fat Mass (lbs): 73  lbs Muscle Mass (lbs): 99.2 lbs Total Body Water (lbs): 69.2 lbs Visceral Fat Rating : 11   Other Clinical Data Today's Visit #: 40 Starting Date: 07/15/00 Comments: Cat 2     ASSESSMENT AND PLAN:  Diet: Illona is currently in the action stage of change. As such, her goal is to continue with weight loss efforts and has agreed to the Category 2 Plan and keeping a food journal and adhering to recommended goals of 1150-1250 calories and 80 or more grams protein daily.   Exercise:  For substantial health benefits, adults should do at least 150 minutes (2 hours and 30 minutes) a week of moderate-intensity, or 75 minutes (1 hour and 15 minutes) a week of vigorous-intensity aerobic physical activity, or an equivalent combination of moderate- and vigorous-intensity aerobic activity. Aerobic activity should be performed in episodes of at least 10 minutes, and preferably, it should be spread throughout the week.  Behavior Modification:  We discussed the following Behavioral Modification Strategies today: increasing lean protein intake, decreasing simple carbohydrates, meal planning and cooking strategies, planning for success, and keep a strict food journal.   Return in about 5 weeks (around 05/28/2023).Marland Kitchen She was informed of the importance of frequent follow up visits to maximize her success with intensive lifestyle modifications for her multiple health conditions.  Attestation Statements:   Reviewed by clinician on day of visit: allergies, medications, problem list, medical history, surgical history, family history, social history, and previous encounter notes.     [name], MD

## 2023-04-23 NOTE — Assessment & Plan Note (Signed)
 On crestor with no myalgias or transaminitis.  No refill needed today.

## 2023-06-02 ENCOUNTER — Ambulatory Visit (INDEPENDENT_AMBULATORY_CARE_PROVIDER_SITE_OTHER): Admitting: Family Medicine

## 2023-06-02 ENCOUNTER — Encounter (INDEPENDENT_AMBULATORY_CARE_PROVIDER_SITE_OTHER): Payer: Self-pay | Admitting: Family Medicine

## 2023-06-02 VITALS — BP 132/73 | HR 75 | Temp 98.1°F | Ht 63.0 in | Wt 176.0 lb

## 2023-06-02 DIAGNOSIS — E1165 Type 2 diabetes mellitus with hyperglycemia: Secondary | ICD-10-CM

## 2023-06-02 DIAGNOSIS — Z6831 Body mass index (BMI) 31.0-31.9, adult: Secondary | ICD-10-CM

## 2023-06-02 DIAGNOSIS — Z7985 Long-term (current) use of injectable non-insulin antidiabetic drugs: Secondary | ICD-10-CM

## 2023-06-02 DIAGNOSIS — E1159 Type 2 diabetes mellitus with other circulatory complications: Secondary | ICD-10-CM | POA: Diagnosis not present

## 2023-06-02 DIAGNOSIS — I152 Hypertension secondary to endocrine disorders: Secondary | ICD-10-CM | POA: Diagnosis not present

## 2023-06-02 DIAGNOSIS — Z7984 Long term (current) use of oral hypoglycemic drugs: Secondary | ICD-10-CM

## 2023-06-02 MED ORDER — METFORMIN HCL ER 500 MG PO TB24
1000.0000 mg | ORAL_TABLET | Freq: Every day | ORAL | 0 refills | Status: DC
Start: 2023-06-02 — End: 2023-09-01

## 2023-06-02 MED ORDER — SEMAGLUTIDE (2 MG/DOSE) 8 MG/3ML ~~LOC~~ SOPN
2.0000 mg | PEN_INJECTOR | SUBCUTANEOUS | 0 refills | Status: DC
Start: 2023-06-02 — End: 2023-07-21

## 2023-06-02 NOTE — Assessment & Plan Note (Signed)
 Blood pressure well controlled today.  No chest pressure, chest pain, or headache.  On lisinopril  daily- no refills needed.

## 2023-06-02 NOTE — Assessment & Plan Note (Signed)
 Patient on ozempic  and metformin  with no GI side effects.  Needs a refill of meds today.  Will continue current dose with no changes.

## 2023-06-02 NOTE — Progress Notes (Signed)
 SUBJECTIVE:  Chief Complaint: Obesity  Interim History: Patient has been mostly living life since last appointment.  She stays busy with her family demands.  Last week she and her husband did yardwork all weekend.  Feels like she has done well with being aware of what she eats she recognizes she doesn't plan as much as she should.  Dinner time is still fairly consistent and on plan.  She does snack between meals and she thinks she is hungry when snacking. For snacks she is doing chocolate covered pretzels, yum yum ice cream with her grandkids. She knows she is trying to stay mindful of food choices.  [place] with her [place]kids.  Zonie is here to discuss her progress with her obesity treatment plan. She is on the Category 2 Plan and states she is following her eating plan approximately 50 % of the time. She states she is exercising 60 minutes 5 times per week and walking in the evenings.   OBJECTIVE: Visit Diagnoses: Problem List Items Addressed This Visit       Cardiovascular and Mediastinum   Hypertension associated with diabetes (HCC)   Blood pressure well controlled today.  No chest pressure, chest pain, or headache.  On lisinopril  daily- no refills needed.      Relevant Medications   Semaglutide , 2 MG/DOSE, 8 MG/3ML SOPN   metFORMIN  (GLUCOPHAGE -XR) 500 MG 24 hr tablet     Endocrine   Type 2 diabetes mellitus with hyperglycemia, without long-term current use of insulin  (HCC) - Primary   Patient on ozempic  and metformin  with no GI side effects.  Needs a refill of meds today.  Will continue current dose with no changes.      Relevant Medications   Semaglutide , 2 MG/DOSE, 8 MG/3ML SOPN   metFORMIN  (GLUCOPHAGE -XR) 500 MG 24 hr tablet     Other   Morbid obesity (HCC)   Relevant Medications   Semaglutide , 2 MG/DOSE, 8 MG/3ML SOPN   metFORMIN  (GLUCOPHAGE -XR) 500 MG 24 hr tablet   Other Visit Diagnoses       BMI 31.0-31.9,adult            Vitals Temp: 98.1 F (36.7 C) BP: 132/73 Pulse Rate: 75 SpO2: 97 %   Anthropometric Measurements Height: 5\' 3"  (1.6 m) Weight: 176 lb (79.8 kg) BMI (Calculated): 31.18 Weight at Last Visit: 177 lb Weight Lost Since Last Visit: 1 Weight Gained Since Last Visit: 0 Starting Weight: 226 lb Total Weight Loss (lbs): 50 lb (22.7 kg)   Body Composition  Body Fat %: 40.9 % Fat Mass (lbs): 72 lbs Muscle Mass (lbs): 99 lbs Total Body Water (lbs): 69.6 lbs Visceral Fat Rating : 11   Other Clinical Data Fasting: no Labs: no Today's Visit #: 34 Starting Date: 07/15/00 Comments: Cat 2     ASSESSMENT AND PLAN:  Diet: Samone is currently in the action stage of change. As such, her goal is to continue with weight loss efforts and has agreed to the Category 2 Plan.  We discussed substitution of nutritional yeast for breadcrumbs and cheese.  Exercise:  For substantial health benefits, adults should do at least 150 minutes (2 hours and 30 minutes) a week of moderate-intensity, or 75 minutes (1 hour and 15 minutes) a week of vigorous-intensity aerobic physical activity, or an equivalent combination of moderate- and vigorous-intensity aerobic activity. Aerobic activity should be performed in episodes of at least 10 minutes, and preferably, it should be spread throughout the  week.  Patient is going to the gym with her husband but is interested in expanding her activity level.  We  discussed the [facility] program at the senior center.   Behavior Modification:  We discussed the following Behavioral Modification Strategies today: increasing lean protein intake, decreasing simple carbohydrates, increasing vegetables, meal planning and cooking strategies, and better snacking choices. .  Return in about 6 weeks (around 07/14/2023).   She was informed of the importance of frequent follow up visits to maximize her success with intensive lifestyle modifications for her multiple health  conditions.  Attestation Statements:   Reviewed by clinician on day of visit: allergies, medications, problem list, medical history, surgical history, family history, social history, and previous encounter notes.   Donaciano Frizzle, MD

## 2023-06-25 ENCOUNTER — Other Ambulatory Visit (INDEPENDENT_AMBULATORY_CARE_PROVIDER_SITE_OTHER): Payer: Self-pay | Admitting: Family Medicine

## 2023-06-25 DIAGNOSIS — I152 Hypertension secondary to endocrine disorders: Secondary | ICD-10-CM

## 2023-07-08 DIAGNOSIS — I10 Essential (primary) hypertension: Secondary | ICD-10-CM | POA: Diagnosis not present

## 2023-07-08 DIAGNOSIS — F419 Anxiety disorder, unspecified: Secondary | ICD-10-CM | POA: Diagnosis not present

## 2023-07-08 DIAGNOSIS — E785 Hyperlipidemia, unspecified: Secondary | ICD-10-CM | POA: Diagnosis not present

## 2023-07-08 DIAGNOSIS — E119 Type 2 diabetes mellitus without complications: Secondary | ICD-10-CM | POA: Diagnosis not present

## 2023-07-08 LAB — MICROALBUMIN / CREATININE URINE RATIO
Creatinine, POC: 235 mg/dL
Microalb Creat Ratio: 8.9
Microalbumin, Urine: 2.08

## 2023-07-08 LAB — LIPID PANEL
Cholesterol: 102 (ref 0–200)
HDL: 43 (ref 35–70)
LDL Cholesterol: 44
Triglycerides: 70 (ref 40–160)

## 2023-07-08 LAB — BASIC METABOLIC PANEL WITH GFR
BUN: 27 — AB (ref 4–21)
CO2: 31 — AB (ref 13–22)
Chloride: 104 (ref 99–108)
Creatinine: 0.8 (ref 0.5–1.1)
Glucose: 92
Potassium: 4.1 meq/L (ref 3.5–5.1)
Sodium: 140 (ref 137–147)

## 2023-07-08 LAB — HEPATIC FUNCTION PANEL
ALT: 14 U/L (ref 7–35)
AST: 15 (ref 13–35)
Alkaline Phosphatase: 59 (ref 25–125)
Bilirubin, Total: 0.6

## 2023-07-08 LAB — COMPREHENSIVE METABOLIC PANEL WITH GFR
Albumin: 4.2 (ref 3.5–5.0)
Calcium: 9.2 (ref 8.7–10.7)
EGFR (Non-African Amer.): 84

## 2023-07-08 LAB — HEMOGLOBIN A1C: Hemoglobin A1C: 5.6

## 2023-07-21 ENCOUNTER — Encounter (INDEPENDENT_AMBULATORY_CARE_PROVIDER_SITE_OTHER): Payer: Self-pay | Admitting: Family Medicine

## 2023-07-21 ENCOUNTER — Ambulatory Visit (INDEPENDENT_AMBULATORY_CARE_PROVIDER_SITE_OTHER): Admitting: Family Medicine

## 2023-07-21 VITALS — BP 120/80 | HR 74 | Temp 97.8°F | Ht 63.0 in | Wt 177.0 lb

## 2023-07-21 DIAGNOSIS — E1165 Type 2 diabetes mellitus with hyperglycemia: Secondary | ICD-10-CM

## 2023-07-21 DIAGNOSIS — Z6831 Body mass index (BMI) 31.0-31.9, adult: Secondary | ICD-10-CM

## 2023-07-21 DIAGNOSIS — Z7985 Long-term (current) use of injectable non-insulin antidiabetic drugs: Secondary | ICD-10-CM | POA: Diagnosis not present

## 2023-07-21 MED ORDER — SEMAGLUTIDE (2 MG/DOSE) 8 MG/3ML ~~LOC~~ SOPN
2.0000 mg | PEN_INJECTOR | SUBCUTANEOUS | 0 refills | Status: DC
Start: 2023-07-21 — End: 2023-10-13

## 2023-07-21 NOTE — Assessment & Plan Note (Addendum)
 Patient has been on ozempic  2.0mg  but is taking 1.0mg  dose now due to lack of medication.  She would like continue her medication at this dose for the next few weeks to see how that manages her cravings and intake. Refill sent in to pharmacy.

## 2023-07-21 NOTE — Progress Notes (Signed)
   SUBJECTIVE:  Chief Complaint: Obesity  Interim History: patient voices that she has been trying to stay focused on the meal plan over the last few weeks. Half of the time she has struggled to stay consistent with eating on plan when there is lots of indulgent food around especially for July 4th.  She is bringing food to the pool but is getting into the snacks she brings for her grandkids.  She isn't always buying protein snacks.  Over the last two weeks she limited Ozempic  to 1.0mg .  Tresha is here to discuss her progress with her obesity treatment plan. She is on the Category 2 Plan and states she is following her eating plan approximately 50 % of the time. She states she is exercising 60 minutes 5 times per week.   OBJECTIVE: Visit Diagnoses: Problem List Items Addressed This Visit       Endocrine   Type 2 diabetes mellitus with hyperglycemia, without long-term current use of insulin  (HCC)   Relevant Medications   Semaglutide , 2 MG/DOSE, 8 MG/3ML SOPN     Other   Morbid obesity (HCC)   Relevant Medications   Semaglutide , 2 MG/DOSE, 8 MG/3ML SOPN   Other Visit Diagnoses       BMI 31.0-31.9,adult    -  Primary       Vitals Temp: 97.8 F (36.6 C) BP: 120/80 Pulse Rate: 74 SpO2: 97 %   Anthropometric Measurements Height: 5' 3 (1.6 m) Weight: 177 lb (80.3 kg) BMI (Calculated): 31.36 Weight at Last Visit: 176 lb Weight Lost Since Last Visit: 0 Weight Gained Since Last Visit: 1 Starting Weight: 226 lb Total Weight Loss (lbs): 49 lb (22.2 kg)   Body Composition  Body Fat %: 40.9 % Fat Mass (lbs): 72.4 lbs Muscle Mass (lbs): 99.4 lbs Total Body Water (lbs): 69.2 lbs Visceral Fat Rating : 11   Other Clinical Data Today's Visit #: 35 Starting Date: 07/15/00 Comments: Cat 2     ASSESSMENT AND PLAN: Assessment & Plan Type 2 diabetes mellitus with hyperglycemia, without long-term current use of insulin  (HCC) Patient has been on ozempic  2.0mg  but is taking  1.0mg  dose now due to lack of medication.  She would like continue her medication at this dose for the next few weeks to see how that manages her cravings and intake. Refill sent in to pharmacy. BMI 31.0-31.9,adult  Morbid obesity (HCC)    Diet: Luvinia is currently in the action stage of change. As such, her goal is to continue with weight loss efforts and has agreed to the Category 2 Plan.   Exercise:  For additional and more extensive health benefits, adults should increase their aerobic physical activity to 300 minutes (5 hours) a week of moderate-intensity, or 150 minutes a week of vigorous-intensity aerobic physical activity, or an equivalent combination of moderate- and vigorous-intensity activity. Additional health benefits are gained by engaging in physical activity beyond this amount.   Behavior Modification:  We discussed the following Behavioral Modification Strategies today: increasing lean protein intake, decreasing simple carbohydrates, increasing vegetables, and better snacking choices.   Return in about 6 weeks (around 09/01/2023).   She was informed of the importance of frequent follow up visits to maximize her success with intensive lifestyle modifications for her multiple health conditions.  Attestation Statements:   Reviewed by clinician on day of visit: allergies, medications, problem list, medical history, surgical history, family history, social history, and previous encounter notes.     Adelita Cho, MD

## 2023-07-29 ENCOUNTER — Other Ambulatory Visit (INDEPENDENT_AMBULATORY_CARE_PROVIDER_SITE_OTHER): Payer: Self-pay

## 2023-08-05 ENCOUNTER — Encounter: Payer: Self-pay | Admitting: Family Medicine

## 2023-08-13 DIAGNOSIS — N390 Urinary tract infection, site not specified: Secondary | ICD-10-CM | POA: Diagnosis not present

## 2023-08-13 DIAGNOSIS — M545 Low back pain, unspecified: Secondary | ICD-10-CM | POA: Diagnosis not present

## 2023-08-13 DIAGNOSIS — M62830 Muscle spasm of back: Secondary | ICD-10-CM | POA: Diagnosis not present

## 2023-08-19 DIAGNOSIS — L309 Dermatitis, unspecified: Secondary | ICD-10-CM | POA: Diagnosis not present

## 2023-08-19 DIAGNOSIS — L820 Inflamed seborrheic keratosis: Secondary | ICD-10-CM | POA: Diagnosis not present

## 2023-08-27 ENCOUNTER — Encounter: Payer: Self-pay | Admitting: Radiology

## 2023-08-27 ENCOUNTER — Ambulatory Visit: Admitting: Radiology

## 2023-08-27 VITALS — BP 134/82 | HR 79 | Temp 98.1°F | Wt 183.0 lb

## 2023-08-27 DIAGNOSIS — R350 Frequency of micturition: Secondary | ICD-10-CM

## 2023-08-27 DIAGNOSIS — N958 Other specified menopausal and perimenopausal disorders: Secondary | ICD-10-CM

## 2023-08-27 LAB — URINALYSIS, COMPLETE W/RFL CULTURE
Bacteria, UA: NONE SEEN /HPF
Bilirubin Urine: NEGATIVE
Glucose, UA: NEGATIVE
Hgb urine dipstick: NEGATIVE
Hyaline Cast: NONE SEEN /LPF
Ketones, ur: NEGATIVE
Leukocyte Esterase: NEGATIVE
Nitrites, Initial: NEGATIVE
Protein, ur: NEGATIVE
RBC / HPF: NONE SEEN /HPF (ref 0–2)
Specific Gravity, Urine: 1.02 (ref 1.001–1.035)
WBC, UA: NONE SEEN /HPF (ref 0–5)
pH: 5.5 (ref 5.0–8.0)

## 2023-08-27 LAB — WET PREP FOR TRICH, YEAST, CLUE

## 2023-08-27 LAB — NO CULTURE INDICATED

## 2023-08-27 MED ORDER — ESTRADIOL 10 MCG VA TABS
1.0000 | ORAL_TABLET | VAGINAL | 4 refills | Status: DC
Start: 2023-08-28 — End: 2023-09-23

## 2023-08-27 NOTE — Progress Notes (Signed)
      Subjective: Debbie Castro is a 63 y.o. female  08/13/23 treated for UTI @ urgent care with Cipro 250 mg (urine culture was negative). Still complains of urinary frequency, urgency, no dysuria, nocturia, voiding small amounts. Symptoms began over 1 month ago. Denies vaginal itching, discharge, odor. Also wants to discuss changing from estrace  cream to estrogen inserts.    Review of Systems  All other systems reviewed and are negative.   Past Medical History:  Diagnosis Date   Allergy    mold.seasonal   Anxiety    occasional   Asthma    Back pain    Bilateral swelling of feet and ankles    Constipation    Diabetes mellitus without complication (HCC)    prediabetic per PCP   HSV-1 infection    Fever blisters.   Hyperlipidemia    Hypertension    Joint pain    Kidney problem    Other fatigue    Prediabetes    Shortness of breath on exertion    Vitamin D  deficiency       Objective:  Today's Vitals   08/27/23 1410  BP: 134/82  Pulse: 79  Temp: 98.1 F (36.7 C)  TempSrc: Oral  SpO2: 99%  Weight: 183 lb (83 kg)   Body mass index is 32.42 kg/m.   Physical Exam Vitals and nursing note reviewed. Exam conducted with a chaperone present.  Constitutional:      Appearance: Normal appearance. She is well-developed.  Pulmonary:     Effort: Pulmonary effort is normal.  Abdominal:     General: Abdomen is flat.     Palpations: Abdomen is soft.  Genitourinary:    General: Normal vulva.     Vagina: No vaginal discharge, erythema (moderately atrophic), bleeding or lesions.     Uterus: Absent.      Adnexa: Right adnexa normal and left adnexa normal.  Neurological:     Mental Status: She is alert.  Psychiatric:        Mood and Affect: Mood normal.        Thought Content: Thought content normal.        Judgment: Judgment normal.     Urine dipstick shows negative for all components.  Micro exam: negative for WBC's or RBC's.  Microscopic wet-mount exam shows  negative for pathogens, normal epithelial cells.   Darice Hoit, CMA present for exam  Assessment:/Plan:  1. Urinary frequency (Primary) - Estrace  cream 1 gram nightly x 7 days then switch to yuvafem  tablets twice weekly.  - May try AZO - If symptoms persist x 7 days will refer to urology - Urinalysis,Complete w/RFL Culture  2. Genitourinary syndrome of menopause - Estradiol  10 MCG TABS vaginal tablet; Place 1 tablet (10 mcg total) vaginally 2 (two) times a week.  Dispense: 8 tablet; Refill: 4 - WET PREP FOR TRICH, YEAST, CLUE     Gunnison Chahal B, NP 2:26 PM

## 2023-09-01 ENCOUNTER — Encounter (INDEPENDENT_AMBULATORY_CARE_PROVIDER_SITE_OTHER): Payer: Self-pay | Admitting: Family Medicine

## 2023-09-01 ENCOUNTER — Ambulatory Visit (INDEPENDENT_AMBULATORY_CARE_PROVIDER_SITE_OTHER): Admitting: Family Medicine

## 2023-09-01 VITALS — BP 137/84 | HR 83 | Temp 98.0°F | Ht 63.0 in | Wt 179.0 lb

## 2023-09-01 DIAGNOSIS — E1165 Type 2 diabetes mellitus with hyperglycemia: Secondary | ICD-10-CM | POA: Diagnosis not present

## 2023-09-01 DIAGNOSIS — E1159 Type 2 diabetes mellitus with other circulatory complications: Secondary | ICD-10-CM | POA: Diagnosis not present

## 2023-09-01 DIAGNOSIS — I152 Hypertension secondary to endocrine disorders: Secondary | ICD-10-CM

## 2023-09-01 DIAGNOSIS — Z6831 Body mass index (BMI) 31.0-31.9, adult: Secondary | ICD-10-CM

## 2023-09-01 DIAGNOSIS — Z7984 Long term (current) use of oral hypoglycemic drugs: Secondary | ICD-10-CM

## 2023-09-01 MED ORDER — METFORMIN HCL ER 500 MG PO TB24
1000.0000 mg | ORAL_TABLET | Freq: Every day | ORAL | 0 refills | Status: DC
Start: 2023-09-01 — End: 2023-12-09

## 2023-09-01 MED ORDER — LISINOPRIL 5 MG PO TABS: 5.0000 mg | ORAL_TABLET | Freq: Every day | ORAL | 0 refills | Status: DC

## 2023-09-01 NOTE — Progress Notes (Unsigned)
 SUBJECTIVE:  Chief Complaint: Obesity  Interim History: Patient has [place] to the lake a [place] times with her husband and grandkids.  She has found when she deviates from her routine she has a hard time getting all her food in.  She finds she doesn't even plan if her husband isn't at home.  Her husband has an upcoming Moh's procedure so she will be local for a bit.  She is planning for a trip to [place] in the next few months.   Debbie Castro is here to discuss her progress with her obesity treatment plan. She is on the Category 2 Plan and states she is following her eating plan approximately 50 % of the time. She states she is exercising 60 minutes 5 times per week.   OBJECTIVE: Visit Diagnoses: Problem List Items Addressed This Visit       Cardiovascular and Mediastinum   Hypertension associated with diabetes (HCC) - Primary   Relevant Medications   lisinopril  (ZESTRIL ) 5 MG tablet   metFORMIN  (GLUCOPHAGE -XR) 500 MG 24 hr tablet     Endocrine   Type 2 diabetes mellitus with hyperglycemia, without long-term current use of insulin  (HCC)   Relevant Medications   lisinopril  (ZESTRIL ) 5 MG tablet   metFORMIN  (GLUCOPHAGE -XR) 500 MG 24 hr tablet     Other   Morbid obesity (HCC)   Relevant Medications   metFORMIN  (GLUCOPHAGE -XR) 500 MG 24 hr tablet   Other Visit Diagnoses       BMI 31.0-31.9,adult           Vitals Temp: 98 F (36.7 C) BP: 137/84 Pulse Rate: 83 SpO2: 98 %   Anthropometric Measurements Height: 5' 3 (1.6 m) Weight: 179 lb (81.2 kg) BMI (Calculated): 31.72 Weight at Last Visit: 177 lb Weight Lost Since Last Visit: 0 Weight Gained Since Last Visit: 2 Starting Weight: 226 lb Total Weight Loss (lbs): 47 lb (21.3 kg)   Body Composition  Body Fat %: 41.3 % Fat Mass (lbs): 74 lbs Muscle Mass (lbs): 99.8 lbs Total Body Water (lbs): 69.6 lbs Visceral Fat Rating : 11   Other Clinical Data Fasting: yes Labs: no Today's Visit #: \ Starting Date:  07/15/00 Comments: Cat 2     ASSESSMENT AND PLAN: Assessment & Plan Hypertension associated with diabetes (HCC) Blood pressure well-controlled today but upper limits of normal.  Needs a refill of lisinopril  5 mg tablet.  No chest pain, chest pressure, or headache reported.  Refill sent into pharmacy. Type 2 diabetes mellitus with hyperglycemia, without long-term current use of insulin  (HCC) Current treatment includes Ozempic  and metformin .  Patient is titrating down off of Ozempic  currently.  Needs a refill of metformin  at current dose.  No GI side effects mentioned.  Given decreased nutritional intake will not increase metformin  at this time but will continue to follow for increased hunger cues as  patient tapers off Ozempic . Morbid obesity (HCC)  BMI 31.0-31.9,adult    Diet: Debbie Castro is currently in the action stage of change. As such, her goal is to continue with weight loss efforts and has agreed to the Category 2 Plan.   Exercise:  For substantial health benefits, adults should do at least 150 minutes (2 hours and 30 minutes) a week of moderate-intensity, or 75 minutes (1 hour and 15 minutes) a week of vigorous-intensity aerobic physical activity, or an equivalent combination of moderate- and vigorous-intensity aerobic activity. Aerobic activity should be performed in episodes of at least 10 minutes, and preferably, it should be  spread throughout the week.  Behavior Modification:  We discussed the following Behavioral Modification Strategies today: increasing lean protein intake, decreasing simple carbohydrates, increasing vegetables, meal planning and cooking strategies, and keeping healthy foods in the home.   Return in about 6 weeks (around 10/13/2023).   She was informed of the importance of frequent follow up visits to maximize her success with intensive lifestyle modifications for her multiple health conditions.  Attestation Statements:   Reviewed by clinician on day of visit:  allergies, medications, problem list, medical history, surgical history, family history, social history, and previous encounter notes.     Adelita Cho, MD

## 2023-09-02 NOTE — Assessment & Plan Note (Signed)
 Blood pressure well-controlled today but upper limits of normal.  Needs a refill of lisinopril  5 mg tablet.  No chest pain, chest pressure, or headache reported.  Refill sent into pharmacy.

## 2023-09-02 NOTE — Assessment & Plan Note (Signed)
 Current treatment includes Ozempic  and metformin .  Patient is titrating down off of Ozempic  currently.  Needs a refill of metformin  at current dose.  No GI side effects mentioned.  Given decreased nutritional intake will not increase metformin  at this time but will continue to follow for increased hunger cues as  patient tapers off Ozempic .

## 2023-09-03 DIAGNOSIS — R350 Frequency of micturition: Secondary | ICD-10-CM

## 2023-09-03 DIAGNOSIS — N958 Other specified menopausal and perimenopausal disorders: Secondary | ICD-10-CM

## 2023-09-03 DIAGNOSIS — R300 Dysuria: Secondary | ICD-10-CM | POA: Diagnosis not present

## 2023-09-03 DIAGNOSIS — R102 Pelvic and perineal pain: Secondary | ICD-10-CM | POA: Diagnosis not present

## 2023-09-03 NOTE — Telephone Encounter (Signed)
 Patient also left message on triage lone requesting referral. States she is not feeling better.   Call returned to patient, no answer. Left message advising message received and forwarded to Jami, our office will f/u with recommendations.   Per review of EPIC, looks like patient was referred to [facility] in 05/2020.

## 2023-09-03 NOTE — Telephone Encounter (Signed)
 Spoke with patient, patient request referral to [facility], Dr. Elisabeth, referral placed. Advised patient she may not need referral, recommended calling to see if she can schedule. Will have referral coordinator f/u on referral. ER precautions provided for new/worsening symptoms. Return call to office if any additional assistance needed.   Routing to [employer].   Encounter closed.

## 2023-09-03 NOTE — Telephone Encounter (Signed)
 May refer back to [facility] or [facility] in [facility].

## 2023-09-11 ENCOUNTER — Ambulatory Visit: Payer: Federal, State, Local not specified - PPO | Admitting: Obstetrics and Gynecology

## 2023-09-11 DIAGNOSIS — R102 Pelvic and perineal pain: Secondary | ICD-10-CM | POA: Diagnosis not present

## 2023-09-11 DIAGNOSIS — K5730 Diverticulosis of large intestine without perforation or abscess without bleeding: Secondary | ICD-10-CM | POA: Diagnosis not present

## 2023-09-11 DIAGNOSIS — N1330 Unspecified hydronephrosis: Secondary | ICD-10-CM | POA: Diagnosis not present

## 2023-09-18 ENCOUNTER — Encounter: Payer: Self-pay | Admitting: Obstetrics and Gynecology

## 2023-09-18 NOTE — Progress Notes (Signed)
 63 y.o. G67P2012 Married Caucasian female here for annual exam. Would like to discuss having low libido, not sleeping well, and abnormal facial hair growth.    She is shaving her chin due to increased hair growth.    Patient not having increased hair growth elsewhere.   Wants selective labs.   Not able to get back to sleep when she gets up at night to use the bathroom.   Has seen Dr. Elisabeth of urology for bladder pain.  Had a CT scan at [facility], and it was normal. She will have a cystoscopy.   Using increased estrogen cream.   Taking care of sets of elderly parents and grandchildren.   PCP: Dayna Motto, DO   No LMP recorded. Patient has had a hysterectomy.           Sexually active: Yes.    The current method of family planning is status post hysterectomy.    Menopausal hormone therapy:  Estradiol  tablets  Exercising: Yes.    Gym 5x week and walking Smoker:  no  OB History  Gravida Para Term Preterm AB Living  3 2 2  0 1 2  SAB IAB Ectopic Multiple Live Births  1 0 0 0 2    # Outcome Date GA Lbr Len/2nd Weight Sex Type Anes PTL Lv  3 Term 12/03/86    M Vag-Spont   LIV     Complications: Failure to Progress in Second Stage  2 Term 04/19/84    M Vag-Spont   LIV  1 SAB              HEALTH MAINTENANCE: Last 2 paps:  2005 normal  History of abnormal Pap or positive HPV:  no Mammogram:   08/23/22 R Breast- Breast Density Cat C, BIRADS Cat 1 neg  Colonoscopy:  04/11/21  Bone Density:  03/01/19  Result  normal    Immunization History  Administered Date(s) Administered   PFIZER(Purple Top)SARS-COV-2 Vaccination 03/29/2019, 04/21/2019      reports that she has never smoked. She has never been exposed to tobacco smoke. She has never used smokeless tobacco. She reports current alcohol use. She reports that she does not use drugs.  Past Medical History:  Diagnosis Date   Allergy    mold.seasonal   Anxiety    occasional   Asthma    Back pain    Bilateral swelling of  feet and ankles    Constipation    Diabetes mellitus without complication (HCC)    prediabetic per PCP   HSV-1 infection    Fever blisters.   Hyperlipidemia    Hypertension    Joint pain    Kidney problem    Other fatigue    Prediabetes    Shortness of breath on exertion    Vitamin D  deficiency     Past Surgical History:  Procedure Laterality Date   ANKLE SURGERY  01/15/1996   right   COLONOSCOPY     TOTAL ABDOMINAL HYSTERECTOMY  01/15/2003   still has ovaries   WISDOM TOOTH EXTRACTION  01/15/1980    Current Outpatient Medications  Medication Sig Dispense Refill   acyclovir (ZOVIRAX) 400 MG tablet Take 400 mg by mouth as needed.      ALPRAZolam (XANAX) 0.5 MG tablet Take 0.5 mg by mouth at bedtime as needed. Rarely takes for anxiety     cetirizine (ZYRTEC) 10 MG tablet Take 10 mg by mouth daily. allergies     cholecalciferol (VITAMIN D3) 25 MCG (  1000 UNIT) tablet Take 2,000 Units by mouth daily.     CRANBERRY PO Take 1 tablet by mouth at bedtime.     Efinaconazole  10 % SOLN Apply 1 drop topically daily. 4 mL 11   Estradiol  10 MCG TABS vaginal tablet Place 1 tablet (10 mcg total) vaginally 2 (two) times a week. 8 tablet 4   Fluorouracil POWD      Insulin  Pen Needle (BD PEN NEEDLE NANO 2ND GEN) 32G X 4 MM MISC 1 Package by Does not apply route 2 (two) times daily. 100 each 0   lisinopril  (ZESTRIL ) 5 MG tablet Take 1 tablet (5 mg total) by mouth daily. 90 tablet 0   metFORMIN  (GLUCOPHAGE -XR) 500 MG 24 hr tablet Take 2 tablets (1,000 mg total) by mouth daily. 180 tablet 0   naproxen sodium (ANAPROX) 220 MG tablet Take 440 mg by mouth as needed. Aches and pain     rosuvastatin  (CRESTOR ) 40 MG tablet Take 0.5 tablets (20 mg total) by mouth daily.     Semaglutide , 2 MG/DOSE, 8 MG/3ML SOPN Inject 2 mg as directed once a week. 9 mL 0   No current facility-administered medications for this visit.    ALLERGIES: Codeine and Penicillins  Family History  Problem Relation Age of  Onset   Anxiety disorder Mother    Stroke Mother    Glaucoma Mother        loss of vision left eye   Anxiety disorder Father    Hyperlipidemia Father    Hypertension Father    Stroke Father    Cancer Maternal Grandfather    Anxiety disorder Other    Colon cancer Neg Hx    Esophageal cancer Neg Hx    Stomach cancer Neg Hx    Breast cancer Neg Hx    Colon polyps Neg Hx    Rectal cancer Neg Hx     Review of Systems  All other systems reviewed and are negative.   PHYSICAL EXAM:  BP 120/74 (BP Location: Left Arm, Patient Position: Sitting)   Pulse (!) 54   Ht 5' 3.5 (1.613 m)   Wt 185 lb (83.9 kg)   SpO2 98%   BMI 32.26 kg/m     General appearance: alert, cooperative and appears stated age Head: normocephalic, without obvious abnormality, atraumatic Neck: no adenopathy, supple, symmetrical, trachea midline and thyroid normal to inspection and palpation Lungs: clear to auscultation bilaterally Breasts: normal appearance, no masses or tenderness, No nipple retraction or dimpling, No nipple discharge or bleeding, No axillary adenopathy Heart: regular rate and regularly irregular. Abdomen: soft, non-tender; no masses, no organomegaly Extremities: extremities normal, atraumatic, no cyanosis or edema Skin: skin color, texture, turgor normal. No rashes or lesions Lymph nodes: cervical, supraclavicular, and axillary nodes normal. Neurologic: grossly normal  Pelvic: External genitalia:  no lesions              No abnormal inguinal nodes palpated.              Urethra:  normal appearing urethra with no masses, tenderness or lesions              Bartholins and Skenes: normal                 Vagina: normal appearing vagina with normal color and discharge, no lesions              Cervix: absent              Pap  taken: no Bimanual Exam:  Uterus:  absent              Adnexa: no mass, fullness, tenderness              Rectal exam: yes.  Confirms.              Anus:  normal sphincter  tone, no lesions  Chaperone was present for exam:  Kari HERO, CMA  ASSESSMENT: Well woman visit with gynecologic exam. Status post TAH.  Cervix was removed.  Ovaries remain.  Vaginal atrophy.  Insomnia.   Increased facial hair.  Low libido. Irregular heart beat.  PAC or PVC? Hx HSV 1.  Acyclovir through PCP. PHQ-2-9: 0  PLAN: Mammogram screening discussed. Self breast awareness reviewed. Pap and HRV collected:  no.  Not indicated.  Guidelines for Calcium , Vitamin D , regular exercise program including cardiovascular and weight bearing exercise. Medication refills:  Switch back to Estradiol  cream 1 gram pv and pea size amount to the urethra twice a week at bedtime.  I discussed benefit to reduce risk of UTI and also discussed potential effect on breast cancer.  Testosterone, prolactin, TSH. I have suggested tx options for increased facial hair:  Vaniqa, electrolysis, laser and spironolactone.  If she would like spironolactone, she will get the Rx for her PCP or weight management office as she is taking lisinopril . We reviewed potential Trazodone to help with her sleep quality.   She will check with her PCP.  Testosterone reviewed as potential treatment of decreased libido.  This may increase facial hair growth.  No Rx given.  We talked about having time together with her partner. Patient will follow up with her PCP regarding her irregular heartbeat.  Follow up:  yearly and prn.

## 2023-09-23 ENCOUNTER — Encounter: Payer: Self-pay | Admitting: Obstetrics and Gynecology

## 2023-09-23 ENCOUNTER — Ambulatory Visit (INDEPENDENT_AMBULATORY_CARE_PROVIDER_SITE_OTHER): Payer: Federal, State, Local not specified - PPO | Admitting: Obstetrics and Gynecology

## 2023-09-23 VITALS — BP 120/74 | HR 54 | Ht 63.5 in | Wt 185.0 lb

## 2023-09-23 DIAGNOSIS — Z1331 Encounter for screening for depression: Secondary | ICD-10-CM | POA: Diagnosis not present

## 2023-09-23 DIAGNOSIS — L680 Hirsutism: Secondary | ICD-10-CM

## 2023-09-23 DIAGNOSIS — R6882 Decreased libido: Secondary | ICD-10-CM

## 2023-09-23 DIAGNOSIS — Z01419 Encounter for gynecological examination (general) (routine) without abnormal findings: Secondary | ICD-10-CM | POA: Diagnosis not present

## 2023-09-23 MED ORDER — ESTRADIOL 0.1 MG/GM VA CREA: TOPICAL_CREAM | VAGINAL | 2 refills | Status: AC

## 2023-09-23 NOTE — Patient Instructions (Addendum)
 Trazodone Tablets What is this medication? TRAZODONE (TRAZ oh done) treats depression. It increases the amount of serotonin in the brain, a substance that helps regulate mood. This medicine may be used for other purposes; ask your health care provider or pharmacist if you have questions. COMMON BRAND NAME(S): Desyrel What should I tell my care team before I take this medication? They need to know if you have any of these conditions: Bipolar disorder Bleeding disorder Glaucoma Heart disease, or previous heart attack Irregular heartbeat or rhythm Kidney disease Liver disease Low levels of sodium in the blood Suicidal thoughts, plans, or attempt by you or a family member An unusual or allergic reaction to trazodone, other medications, foods, dyes, or preservatives Pregnant or trying to get pregnant Breastfeeding How should I use this medication? Take this medication by mouth with a glass of water. Take it as directed on the prescription label at the same time every day. Take this medication shortly after a meal or a light snack. Keep taking this medication unless your care team tells you to stop. Stopping it too quickly can cause serious side effects. It can also make your condition worse. A special MedGuide will be given to you by the pharmacist with each prescription and refill. Be sure to read this information carefully each time. Talk to your care team about the use of this medication in children. Special care may be needed. Overdosage: If you think you have taken too much of this medicine contact a poison control center or emergency room at once. NOTE: This medicine is only for you. Do not share this medicine with others. What if I miss a dose? If you miss a dose, take it as soon as you can. If it is almost time for your next dose, take only that dose. Do not take double or extra doses. What may interact with this medication? Do not take this medication with any of the  following: Certain medications for fungal infections, such as fluconazole, itraconazole, ketoconazole, posaconazole, voriconazole Cisapride Dronedarone Linezolid MAOIs, such as Carbex, Eldepryl, Marplan, Nardil, and Parnate Mesoridazine Methylene blue (injected into a vein) Pimozide Saquinavir Thioridazine This medication may also interact with the following: Alcohol Antiviral medications for HIV or AIDS Aspirin and aspirin-like medications Barbiturates, such as phenobarbital Certain medications for blood pressure, heart disease, irregular heart beat Certain medications for mental health conditions Certain medications for migraine headache, such as almotriptan, eletriptan, frovatriptan, naratriptan, rizatriptan, sumatriptan, zolmitriptan Certain medications for seizures, such as carbamazepine and phenytoin Certain medications for sleep Certain medications that treat or prevent blood clots, such as dalteparin, enoxaparin, warfarin Digoxin Fentanyl Lithium NSAIDS, medications for pain and inflammation, such as ibuprofen or naproxen Other medications that cause heart rhythm changes Rasagiline Supplements, such as St. John's wort, kava kava, valerian Tramadol Tryptophan This list may not describe all possible interactions. Give your health care provider a list of all the medicines, herbs, non-prescription drugs, or dietary supplements you use. Also tell them if you smoke, drink alcohol, or use illegal drugs. Some items may interact with your medicine. What should I watch for while using this medication? Visit your care team for regular checks on your progress. Tell your care team if your symptoms do not start to get better or if they get worse. Because it may take several weeks to see the full effects of this medication, it is important to continue your treatment as prescribed by your care team. Watch for new or worsening thoughts of suicide or depression. This  includes sudden changes  in mood, behaviors, or thoughts. These changes can happen at any time but are more common in the beginning of treatment or after a change in dose. Call your care team right away if you experience these thoughts or worsening depression. This medication may cause mood and behavior changes, such as anxiety, nervousness, irritability, hostility, restlessness, excitability, hyperactivity, or trouble sleeping. These changes can happen at any time but are more common in the beginning of treatment or after a change in dose. Call your care team right away if you notice any of these symptoms. This medication may affect your coordination, reaction time, or judgment. Do not drive or operate machinery until you know how this medication affects you. Sit up or stand slowly to reduce the risk of dizzy or fainting spells. Drinking alcohol with this medication can increase the risk of these side effects. This medication may cause dry eyes and blurred vision. If you wear contact lenses you may feel some discomfort. Lubricating drops may help. See your care team if the problem does not go away or is severe. Your mouth may get dry. Chewing sugarless gum or sucking hard candy and drinking plenty of water may help. Contact your care team if the problem does not go away or is severe. What side effects may I notice from receiving this medication? Side effects that you should report to your care team as soon as possible: Allergic reactions--skin rash, itching, hives, swelling of the face, lips, tongue, or throat Bleeding--bloody or black, tar-like stools, red or dark brown urine, vomiting blood or brown material that looks like coffee grounds, small, red or purple spots on skin, unusual bleeding or bruising Heart rhythm changes--fast or irregular heartbeat, dizziness, feeling faint or lightheaded, chest pain, trouble breathing Low blood pressure--dizziness, feeling faint or lightheaded, blurry vision Low sodium level--muscle  weakness, fatigue, dizziness, headache, confusion Prolonged or painful erection Serotonin syndrome--irritability, confusion, fast or irregular heartbeat, muscle stiffness, twitching muscles, sweating, high fever, seizures, chills, vomiting, diarrhea Sudden eye pain or change in vision such as blurry vision, seeing halos around lights, vision loss Thoughts of suicide or self-harm, worsening mood, feelings of depression Side effects that usually do not require medical attention (report to your care team if they continue or are bothersome): Change in sex drive or performance Constipation Dizziness Drowsiness Dry mouth This list may not describe all possible side effects. Call your doctor for medical advice about side effects. You may report side effects to FDA at 1-800-FDA-1088. Where should I keep my medication? Keep out of the reach of children and pets. Store at room temperature between 15 and 30 degrees C (59 to 86 degrees F). Protect from light. Keep container tightly closed. Throw away any unused medication after the expiration date. NOTE: This sheet is a summary. It may not cover all possible information. If you have questions about this medicine, talk to your doctor, pharmacist, or health care provider.  2025 Elsevier/Gold Standard (2023-03-19 00:00:00)  Spironolactone Tablets What is this medication? SPIRONOLACTONE (speer on oh LAK tone) treats high blood pressure and heart failure. It may also be used to reduce swelling related to heart, kidney, or liver disease. It helps your kidneys remove more fluid and salt from your blood through the urine without losing too much potassium. It belongs to a group of medications called diuretics. This medicine may be used for other purposes; ask your health care provider or pharmacist if you have questions. COMMON BRAND NAME(S): Aldactone What should I  tell my care team before I take this medication? They need to know if you have any of these  conditions: Addison's disease or low adrenal gland function High blood level of potassium Kidney disease Liver disease An unusual or allergic reaction to spironolactone, other medications, foods, dyes, or preservatives Pregnant or trying to get pregnant Breast-feeding How should I use this medication? Take this medication by mouth. Take it as directed on the prescription label at the same time every day. You can take it with or without food. You should always take it the same way. Keep taking it unless your care team tells you to stop. Talk to your care team about the use of this medication in children. Special care may be needed. Overdosage: If you think you have taken too much of this medicine contact a poison control center or emergency room at once. NOTE: This medicine is only for you. Do not share this medicine with others. What if I miss a dose? If you miss a dose, take it as soon as you can. If it is almost time for your next dose, take only that dose. Do not take double or extra doses. What may interact with this medication? Do not take this medication with any of the following: Cidofovir Eplerenone Tranylcypromine This medication may also interact with the following: Aspirin Certain medications for blood pressure or heart disease, such as benazepril, lisinopril , losartan, valsartan Certain medications that prevent or treat blood clots, such as heparin and enoxaparin Cholestyramine Cyclosporine Digoxin Lithium Medications that relax muscles for surgery NSAIDs, medications for pain and inflammation, such as ibuprofen or naproxen Other diuretics Potassium salts or supplements Steroid medications, such as prednisone or cortisone Trimethoprim This list may not describe all possible interactions. Give your health care provider a list of all the medicines, herbs, non-prescription drugs, or dietary supplements you use. Also tell them if you smoke, drink alcohol, or use illegal  drugs. Some items may interact with your medicine. What should I watch for while using this medication? Visit your care team for regular checks on your progress. Check your blood pressure as directed. Know what your blood pressure should be and when to contact your care team. Do not treat yourself for coughs, colds, or pain while you are using this medication without asking your care team for advice. Some medications may increase your blood pressure. Check with your care team if you have severe diarrhea, nausea, and vomiting, or if you sweat a lot. The loss of too much body fluid may make it dangerous for you to take this medication. You may need to be on a special diet while you are taking this medication. Ask your care team. Also, find out how many glasses of fluids you need to drink each day. This medication may affect your coordination, reaction time, or judgment. Do not drive or operate machinery until you know how this medication affects you. Sit up or stand slowly to reduce the risk of dizzy or fainting spells. Drinking alcohol with this medication can increase the risk of these side effects. Avoid salt substitutes unless you are told otherwise by your care team. What side effects may I notice from receiving this medication? Side effects that you should report to your care team as soon as possible: Allergic reactions--skin rash, itching, hives, swelling of the face, lips, tongue, or throat Dehydration--increased thirst, dry mouth, feeling faint or lightheaded, headache, dark yellow or brown urine High potassium level--muscle weakness, fast or irregular heartbeat Kidney injury--decrease in  the amount of urine, swelling of the ankles, hands, or feet Low blood pressure--dizziness, feeling faint or lightheaded, blurry vision Low sodium level--muscle weakness, fatigue, dizziness, headache, confusion Side effects that usually do not require medical attention (report to your care team if they  continue or are bothersome): Breast pain or tenderness Changes in sex drive or performance Dizziness Headache Irregular menstrual cycles or spotting Unexpected breast tissue growth This list may not describe all possible side effects. Call your doctor for medical advice about side effects. You may report side effects to FDA at 1-800-FDA-1088. Where should I keep my medication? Keep out of the reach of children and pets. Store below 25 degrees C (77 degrees F). Get rid of any unused medication after the expiration date. To get rid of medications that are no longer needed or have expired: Take the medication to a medication take-back program. Check with your pharmacy or law enforcement to find a location. If you cannot return the medication, check the label or package insert to see if the medication should be thrown out in the garbage or flushed down the toilet. If you are not sure, ask your care team. If it is safe to put into the trash, take the medication out of the container. Mix the medication with cat litter, dirt, coffee grounds, or other unwanted substance. Seal the mixture in a bag or container. Put it in the trash. NOTE: This sheet is a summary. It may not cover all possible information. If you have questions about this medicine, talk to your doctor, pharmacist, or health care provider.  2024 Elsevier/Gold Standard (2021-07-19 00:00:00)  EXERCISE AND DIET:  We recommended that you start or continue a regular exercise program for good health. Regular exercise means any activity that makes your heart beat faster and makes you sweat.  We recommend exercising at least 30 minutes per day at least 3 days a week, preferably 4 or 5.  We also recommend a diet low in fat and sugar.  Inactivity, poor dietary choices and obesity can cause diabetes, heart attack, stroke, and kidney damage, among others.    ALCOHOL AND SMOKING:  Women should limit their alcohol intake to no more than 7  drinks/beers/glasses of wine (combined, not each!) per week. Moderation of alcohol intake to this level decreases your risk of breast cancer and liver damage. And of course, no recreational drugs are part of a healthy lifestyle.  And absolutely no smoking or even second hand smoke. Most people know smoking can cause heart and lung diseases, but did you know it also contributes to weakening of your bones? Aging of your skin?  Yellowing of your teeth and nails?  CALCIUM  AND VITAMIN D :  Adequate intake of calcium  and Vitamin D  are recommended.  The recommendations for exact amounts of these supplements seem to change often, but generally speaking 600 mg of calcium  (either carbonate or citrate) and 800 units of Vitamin D  per day seems prudent. Certain women may benefit from higher intake of Vitamin D .  If you are among these women, your doctor will have told you during your visit.    PAP SMEARS:  Pap smears, to check for cervical cancer or precancers,  have traditionally been done yearly, although recent scientific advances have shown that most women can have pap smears less often.  However, every woman still should have a physical exam from her gynecologist every year. It will include a breast check, inspection of the vulva and vagina to check for abnormal  growths or skin changes, a visual exam of the cervix, and then an exam to evaluate the size and shape of the uterus and ovaries.  And after 63 years of age, a rectal exam is indicated to check for rectal cancers. We will also provide age appropriate advice regarding health maintenance, like when you should have certain vaccines, screening for sexually transmitted diseases, bone density testing, colonoscopy, mammograms, etc.   MAMMOGRAMS:  All women over 36 years old should have a yearly mammogram. Many facilities now offer a 3D mammogram, which may cost around $50 extra out of pocket. If possible,  we recommend you accept the option to have the 3D mammogram  performed.  It both reduces the number of women who will be called back for extra views which then turn out to be normal, and it is better than the routine mammogram at detecting truly abnormal areas.    COLONOSCOPY:  Colonoscopy to screen for colon cancer is recommended for all women at age 49.  We know, you hate the idea of the prep.  We agree, BUT, having colon cancer and not knowing it is worse!!  Colon cancer so often starts as a polyp that can be seen and removed at colonscopy, which can quite literally save your life!  And if your first colonoscopy is normal and you have no family history of colon cancer, most women don't have to have it again for 10 years.  Once every ten years, you can do something that may end up saving your life, right?  We will be happy to help you get it scheduled when you are ready.  Be sure to check your insurance coverage so you understand how much it will cost.  It may be covered as a preventative service at no cost, but you should check your particular policy.

## 2023-09-24 ENCOUNTER — Ambulatory Visit: Payer: Self-pay | Admitting: Obstetrics and Gynecology

## 2023-09-25 ENCOUNTER — Other Ambulatory Visit: Payer: Self-pay | Admitting: Family Medicine

## 2023-09-25 DIAGNOSIS — Z1231 Encounter for screening mammogram for malignant neoplasm of breast: Secondary | ICD-10-CM

## 2023-09-26 ENCOUNTER — Inpatient Hospital Stay
Admission: RE | Admit: 2023-09-26 | Discharge: 2023-09-26 | Discharge status: Home / Self Care | Source: Ambulatory Visit | Attending: Family Medicine

## 2023-09-26 DIAGNOSIS — I499 Cardiac arrhythmia, unspecified: Secondary | ICD-10-CM | POA: Diagnosis not present

## 2023-09-26 DIAGNOSIS — Z1231 Encounter for screening mammogram for malignant neoplasm of breast: Secondary | ICD-10-CM

## 2023-09-26 DIAGNOSIS — G4700 Insomnia, unspecified: Secondary | ICD-10-CM | POA: Diagnosis not present

## 2023-09-27 LAB — TESTOS,TOTAL,FREE AND SHBG (FEMALE)
Free Testosterone: 3.4 pg/mL (ref 0.1–6.4)
Sex Hormone Binding: 45 nmol/L (ref 14–73)
Testosterone, Total, LC-MS-MS: 30 ng/dL (ref 2–45)

## 2023-09-27 LAB — TSH: TSH: 1.07 m[IU]/L (ref 0.40–4.50)

## 2023-09-27 LAB — PROLACTIN: Prolactin: 3 ng/mL

## 2023-09-30 ENCOUNTER — Ambulatory Visit: Payer: Self-pay | Admitting: Obstetrics and Gynecology

## 2023-10-10 DIAGNOSIS — R300 Dysuria: Secondary | ICD-10-CM | POA: Diagnosis not present

## 2023-10-10 DIAGNOSIS — R102 Pelvic and perineal pain: Secondary | ICD-10-CM | POA: Diagnosis not present

## 2023-10-13 ENCOUNTER — Ambulatory Visit (INDEPENDENT_AMBULATORY_CARE_PROVIDER_SITE_OTHER): Admitting: Family Medicine

## 2023-10-13 ENCOUNTER — Encounter (INDEPENDENT_AMBULATORY_CARE_PROVIDER_SITE_OTHER): Payer: Self-pay | Admitting: Family Medicine

## 2023-10-13 DIAGNOSIS — E1165 Type 2 diabetes mellitus with hyperglycemia: Secondary | ICD-10-CM

## 2023-10-13 DIAGNOSIS — Z7985 Long-term (current) use of injectable non-insulin antidiabetic drugs: Secondary | ICD-10-CM

## 2023-10-13 DIAGNOSIS — R399 Unspecified symptoms and signs involving the genitourinary system: Secondary | ICD-10-CM | POA: Diagnosis not present

## 2023-10-13 DIAGNOSIS — Z6831 Body mass index (BMI) 31.0-31.9, adult: Secondary | ICD-10-CM | POA: Diagnosis not present

## 2023-10-13 MED ORDER — SEMAGLUTIDE (2 MG/DOSE) 8 MG/3ML ~~LOC~~ SOPN: 2.0000 mg | PEN_INJECTOR | SUBCUTANEOUS | 0 refills | Status: DC

## 2023-10-13 NOTE — Assessment & Plan Note (Signed)
 Patient brought log of Ozempic  usage today.  She was concerned that she wasn't going to have enough medication so went 3 weeks without.  She mentions she did not get 3 months worth at the pharmacy.  Needs a new prescription today and 3 month Rx sent in today.

## 2023-10-13 NOTE — Progress Notes (Signed)
 SUBJECTIVE:  Chief Complaint: Obesity  Interim History: Patient saw urologist and underwent cystoscopy and was diagnosed with spasms and give intravaginal valium  to be used twice a week. She as seen by [facility].  She is referred to pelvic floor PT but her first appointment isn't until December.  Patient feels like she is maintaining with what she is doing with her intake wise.  She has significant stress in her life currently helping her parents take care of a 41 year old niece who needed to be committed last week.  Debbie Castro is here to discuss her progress with her obesity treatment plan. She is on the Category 2 Plan and states she is following her eating plan approximately 25-50 % of the time. She states she is exercising 60 minutes 5 times per week.   OBJECTIVE: Visit Diagnoses: Problem List Items Addressed This Visit       Endocrine   Type 2 diabetes mellitus with hyperglycemia, without long-term current use of insulin  (HCC)   Patient brought log of Ozempic  usage today.  She was concerned that she wasn't going to have enough medication so went 3 weeks without.  She mentions she did not get 3 months worth at the pharmacy.  Needs a new prescription today and 3 month Rx sent in today.       Relevant Medications   Semaglutide , 2 MG/DOSE, 8 MG/3ML SOPN     Other   Morbid obesity (HCC) - Primary   Relevant Medications   Semaglutide , 2 MG/DOSE, 8 MG/3ML SOPN   Other Visit Diagnoses       BMI 31.0-31.9,adult           Vitals Temp: 97.9 F (36.6 C) BP: 137/79 Pulse Rate: 82 SpO2: 98 %   Anthropometric Measurements Height: 5' 3 (1.6 m) Weight: 178 lb (80.7 kg) BMI (Calculated): 31.54 Weight at Last Visit: 179 lb Weight Lost Since Last Visit: 1 Weight Gained Since Last Visit: 0 Starting Weight: 226 lb Total Weight Loss (lbs): 48 lb (21.8 kg)   Body Composition  Body Fat %: 41.3 % Fat Mass (lbs): 73.6 lbs Muscle Mass (lbs): 99.4 lbs Total Body Water (lbs):  68.8 lbs Visceral Fat Rating : 11   Other Clinical Data Starting Date: 07/18/20 Comments: Cat 2     ASSESSMENT AND PLAN: Assessment & Plan Genitourinary symptoms in female patient Patient has recently been seen at [facility] and reports starting Valium  for spasms.  She is to insert Valium  vaginally twice a week for help with the spasms.  She is also to start pelvic floor PT and is awaiting first appointment.  She is going to reach out to [place] [facility] to see if she can get an faster than December.  Will follow-up on symptom management at next appointment with starting pharmacotherapy and hopefully have started physical therapy.  Requesting [facility] notes today to review. Type 2 diabetes mellitus with hyperglycemia, without long-term current use of insulin  (HCC) Patient brought log of Ozempic  usage today.  She was concerned that she wasn't going to have enough medication so went 3 weeks without.  She mentions she did not get 3 months worth at the pharmacy.  Needs a new prescription today and 3 month Rx sent in today.  Morbid obesity (HCC)  BMI 31.0-31.9,adult    Diet: Tamea is currently in the action stage of change. As such, her goal is to continue with weight loss efforts and has agreed to the Category 2 Plan.  Exercise:  For substantial health benefits, adults should do at least 150 minutes (2 hours and 30 minutes) a week of moderate-intensity, or 75 minutes (1 hour and 15 minutes) a week of vigorous-intensity aerobic physical activity, or an equivalent combination of moderate- and vigorous-intensity aerobic activity. Aerobic activity should be performed in episodes of at least 10 minutes, and preferably, it should be spread throughout the week.  Behavior Modification:  We discussed the following Behavioral Modification Strategies today: increasing lean protein intake, decreasing simple carbohydrates, increasing vegetables, meal planning and  cooking strategies, and planning for success.   Return in about 6 weeks (around 11/24/2023).   She was informed of the importance of frequent follow up visits to maximize her success with intensive lifestyle modifications for her multiple health conditions.  Attestation Statements:   Reviewed by clinician on day of visit: allergies, medications, problem list, medical history, surgical history, family history, social history, and previous encounter notes.     Adelita Cho, MD

## 2023-11-10 ENCOUNTER — Telehealth (INDEPENDENT_AMBULATORY_CARE_PROVIDER_SITE_OTHER): Payer: Self-pay

## 2023-11-10 NOTE — Telephone Encounter (Signed)
 This is to inform you that your Prior Authorization request for the above member's Ozempic  (2MG /DOSE) 8MG /3ML [place] SOPN has been approved.   If you are changing the member's therapy the previously approved therapy will be canceled and replaced. The authorization is valid from 10/11/2023 through 11/09/2024. A letter of explanation will also be mailed to the patient.

## 2023-11-17 ENCOUNTER — Encounter: Payer: Self-pay | Admitting: Radiology

## 2023-11-24 ENCOUNTER — Ambulatory Visit (INDEPENDENT_AMBULATORY_CARE_PROVIDER_SITE_OTHER): Payer: Self-pay | Admitting: Family Medicine

## 2023-11-24 ENCOUNTER — Encounter (INDEPENDENT_AMBULATORY_CARE_PROVIDER_SITE_OTHER): Payer: Self-pay | Admitting: Family Medicine

## 2023-11-24 VITALS — BP 156/77 | HR 70 | Temp 98.3°F | Ht 63.0 in | Wt 181.0 lb

## 2023-11-24 DIAGNOSIS — Z6832 Body mass index (BMI) 32.0-32.9, adult: Secondary | ICD-10-CM

## 2023-11-24 DIAGNOSIS — E1165 Type 2 diabetes mellitus with hyperglycemia: Secondary | ICD-10-CM

## 2023-11-24 DIAGNOSIS — Z7985 Long-term (current) use of injectable non-insulin antidiabetic drugs: Secondary | ICD-10-CM | POA: Diagnosis not present

## 2023-11-24 DIAGNOSIS — R399 Unspecified symptoms and signs involving the genitourinary system: Secondary | ICD-10-CM

## 2023-11-24 NOTE — Progress Notes (Deleted)
 Error

## 2023-11-24 NOTE — Assessment & Plan Note (Addendum)
 Patient has been stable on Ozempic  with minimal side effects.  She is still not always getting total nutrition goal in daily.  She mentions that she is open to contemplating switching to Mounjaro next year for further blood sugar and cravings control.  Labs in 1 month with PCP.

## 2023-11-24 NOTE — Progress Notes (Signed)
 SUBJECTIVE:  Chief Complaint: Obesity  Interim History: Patient has been trying to stay mindful of food intake but she mentions her mental status is not where it was previously.  She is dealing with familial stress- her dad's health is going downhill.  She and her husband are watching her grandchildren twice a week at least.  She is planning Thanksgiving at her sister's house which is down the street from her house.    Debbie Castro is here to discuss her progress with her obesity treatment plan. She is on the Category 2 Plan and states she is following her eating plan approximately 25 % of the time. She states she is exercising 60 minutes 4 times per week.   OBJECTIVE: Visit Diagnoses: Problem List Items Addressed This Visit       Endocrine   Type 2 diabetes mellitus with hyperglycemia, without long-term current use of insulin  (HCC)     Other   Morbid obesity (HCC)   Other Visit Diagnoses       Genitourinary symptoms in female patient    -  Primary     BMI 32.0-32.9,adult           Vitals Temp: 98.3 F (36.8 C) BP: (!) 156/77 Pulse Rate: 70 SpO2: 98 %   Anthropometric Measurements Height: 5' 3 (1.6 m) Weight: 181 lb (82.1 kg) BMI (Calculated): 32.07 Weight at Last Visit: 178 lb Weight Lost Since Last Visit: 0 Weight Gained Since Last Visit: 3 lb Starting Weight: 226 lb Total Weight Loss (lbs): 45 lb (20.4 kg)   Body Composition  Body Fat %: 42.5 % Fat Mass (lbs): 77.2 lbs Muscle Mass (lbs): 99.2 lbs Total Body Water (lbs): 71 lbs Visceral Fat Rating : 12   Other Clinical Data Starting Date: 07/18/20 Comments: 2     ASSESSMENT AND PLAN: Assessment & Plan Genitourinary symptoms in female patient Patient has prescription for intravaginal Valium  as well as estrogen Dyal cream.  She reports no significant spasms of the bladder in quite some time since the increase in estrogen.  Has had to sparingly if ever use Valium .  Symptoms improved and well-managed at  this time.  Continue to follow-up at subsequent appointments as to whether or not symptoms have reappeared. Type 2 diabetes mellitus with hyperglycemia, without long-term current use of insulin  (HCC) Patient has been stable on Ozempic  with minimal side effects.  She is still not always getting total nutrition goal in daily.  She mentions that she is open to contemplating switching to Mounjaro next year for further blood sugar and cravings control.  Labs in 1 month with PCP. Morbid obesity (HCC)  BMI 32.0-32.9,adult    Diet: Debbie Castro is currently in the action stage of change. As such, her goal is to continue with weight loss efforts and has agreed to the Category 2 Plan.   Exercise:  For additional and more extensive health benefits, adults should increase their aerobic physical activity to 300 minutes (5 hours) a week of moderate-intensity, or 150 minutes a week of vigorous-intensity aerobic physical activity, or an equivalent combination of moderate- and vigorous-intensity activity. Additional health benefits are gained by engaging in physical activity beyond this amount.  and Adults should also include muscle-strengthening activities that involve all major muscle groups on 2 or more days a week.  Behavior Modification:  We discussed the following Behavioral Modification Strategies today: increasing lean protein intake, decreasing simple carbohydrates, meal planning and cooking strategies, emotional eating strategies , and holiday eating strategies.  Return  in about 8 weeks (around 01/19/2024).   She was informed of the importance of frequent follow up visits to maximize her success with intensive lifestyle modifications for her multiple health conditions.  Attestation Statements:   Reviewed by clinician on day of visit: allergies, medications, problem list, medical history, surgical history, family history, social history, and previous encounter notes.     Debbie Castro Cho, MD

## 2023-12-09 ENCOUNTER — Other Ambulatory Visit (INDEPENDENT_AMBULATORY_CARE_PROVIDER_SITE_OTHER): Payer: Self-pay

## 2023-12-09 ENCOUNTER — Encounter (INDEPENDENT_AMBULATORY_CARE_PROVIDER_SITE_OTHER): Payer: Self-pay | Admitting: Family Medicine

## 2023-12-09 DIAGNOSIS — E1165 Type 2 diabetes mellitus with hyperglycemia: Secondary | ICD-10-CM

## 2023-12-09 MED ORDER — METFORMIN HCL ER 500 MG PO TB24: 1000.0000 mg | ORAL_TABLET | Freq: Every day | ORAL | 0 refills | Status: DC

## 2023-12-17 DIAGNOSIS — M6281 Muscle weakness (generalized): Secondary | ICD-10-CM | POA: Diagnosis not present

## 2023-12-17 DIAGNOSIS — M6289 Other specified disorders of muscle: Secondary | ICD-10-CM | POA: Diagnosis not present

## 2023-12-17 DIAGNOSIS — K5909 Other constipation: Secondary | ICD-10-CM | POA: Diagnosis not present

## 2023-12-30 DIAGNOSIS — E785 Hyperlipidemia, unspecified: Secondary | ICD-10-CM | POA: Diagnosis not present

## 2023-12-30 DIAGNOSIS — Z0000 Encounter for general adult medical examination without abnormal findings: Secondary | ICD-10-CM | POA: Diagnosis not present

## 2023-12-30 DIAGNOSIS — Z6832 Body mass index (BMI) 32.0-32.9, adult: Secondary | ICD-10-CM | POA: Diagnosis not present

## 2023-12-30 DIAGNOSIS — I10 Essential (primary) hypertension: Secondary | ICD-10-CM | POA: Diagnosis not present

## 2023-12-30 DIAGNOSIS — E119 Type 2 diabetes mellitus without complications: Secondary | ICD-10-CM | POA: Diagnosis not present

## 2023-12-30 LAB — BASIC METABOLIC PANEL WITH GFR
BUN: 26 — AB (ref 4–21)
CO2: 30 — AB (ref 13–22)
Chloride: 103 (ref 99–108)
Creatinine: 0.7 (ref 0.5–1.1)
Glucose: 94
Potassium: 4.3 meq/L (ref 3.5–5.1)
Sodium: 138 (ref 137–147)

## 2023-12-30 LAB — HEMOGLOBIN A1C: Hemoglobin A1C: 5.6

## 2023-12-30 LAB — HEPATIC FUNCTION PANEL
ALT: 19 U/L (ref 7–35)
AST: 18 (ref 13–35)
Alkaline Phosphatase: 72 (ref 25–125)
Bilirubin, Total: 0.4

## 2023-12-31 DIAGNOSIS — D0461 Carcinoma in situ of skin of right upper limb, including shoulder: Secondary | ICD-10-CM | POA: Diagnosis not present

## 2023-12-31 DIAGNOSIS — Z85828 Personal history of other malignant neoplasm of skin: Secondary | ICD-10-CM | POA: Diagnosis not present

## 2023-12-31 DIAGNOSIS — D224 Melanocytic nevi of scalp and neck: Secondary | ICD-10-CM | POA: Diagnosis not present

## 2023-12-31 DIAGNOSIS — L821 Other seborrheic keratosis: Secondary | ICD-10-CM | POA: Diagnosis not present

## 2023-12-31 DIAGNOSIS — D225 Melanocytic nevi of trunk: Secondary | ICD-10-CM | POA: Diagnosis not present

## 2023-12-31 DIAGNOSIS — D0471 Carcinoma in situ of skin of right lower limb, including hip: Secondary | ICD-10-CM | POA: Diagnosis not present

## 2023-12-31 LAB — LIPID PANEL
Cholesterol: 230 — AB (ref 0–200)
HDL: 52 (ref 35–70)
LDL Cholesterol: 153
Triglycerides: 135 (ref 40–160)

## 2023-12-31 LAB — COMPREHENSIVE METABOLIC PANEL WITH GFR
Albumin: 4.2 (ref 3.5–5.0)
Calcium: 9.5 (ref 8.7–10.7)
EGFR: 97

## 2024-01-12 DIAGNOSIS — N958 Other specified menopausal and perimenopausal disorders: Secondary | ICD-10-CM | POA: Diagnosis not present

## 2024-01-12 DIAGNOSIS — R300 Dysuria: Secondary | ICD-10-CM | POA: Diagnosis not present

## 2024-01-27 ENCOUNTER — Ambulatory Visit (INDEPENDENT_AMBULATORY_CARE_PROVIDER_SITE_OTHER): Admitting: Family Medicine

## 2024-01-27 VITALS — BP 139/74 | HR 73 | Temp 97.9°F | Ht 63.0 in | Wt 184.0 lb

## 2024-01-27 DIAGNOSIS — I10 Essential (primary) hypertension: Secondary | ICD-10-CM | POA: Diagnosis not present

## 2024-01-27 DIAGNOSIS — Z7985 Long-term (current) use of injectable non-insulin antidiabetic drugs: Secondary | ICD-10-CM | POA: Diagnosis not present

## 2024-01-27 DIAGNOSIS — E1159 Type 2 diabetes mellitus with other circulatory complications: Secondary | ICD-10-CM | POA: Diagnosis not present

## 2024-01-27 DIAGNOSIS — E1165 Type 2 diabetes mellitus with hyperglycemia: Secondary | ICD-10-CM | POA: Diagnosis not present

## 2024-01-27 DIAGNOSIS — Z6832 Body mass index (BMI) 32.0-32.9, adult: Secondary | ICD-10-CM

## 2024-01-27 DIAGNOSIS — I152 Hypertension secondary to endocrine disorders: Secondary | ICD-10-CM

## 2024-01-27 MED ORDER — SEMAGLUTIDE (2 MG/DOSE) 8 MG/3ML ~~LOC~~ SOPN: 2.0000 mg | PEN_INJECTOR | SUBCUTANEOUS | 0 refills | Status: AC

## 2024-01-27 MED ORDER — METFORMIN HCL ER 500 MG PO TB24: 1000.0000 mg | ORAL_TABLET | Freq: Every day | ORAL | 0 refills | Status: AC

## 2024-01-27 MED ORDER — LISINOPRIL 5 MG PO TABS: 5.0000 mg | ORAL_TABLET | Freq: Every day | ORAL | 0 refills | Status: AC

## 2024-01-27 NOTE — Assessment & Plan Note (Signed)
 Patient is interested in pursuing a higher dose of Ozempic  than what she is doing previously.  She is only taking 1mg  currently.  Will increase to what is equivalent of 1.5mg  or 18 clicks back from 2mg .  Will follow up on hunger and cravings at next appointment.  Refill of Ozempic  and Metformin  sent to pharmacy today.

## 2024-01-27 NOTE — Progress Notes (Signed)
 "  SUBJECTIVE:  Chief Complaint: Obesity  Interim History: Patient had a busy holiday season- her husbands brother was in the hospital for a couple of weeks and that added to some stress to the season.  She hosted her family for the holidays.  Patient mentions she did do some indulgent eating over the season.  She is getting back on a routine that is more in line with her nutrition and physical activity goals. She is looking into possibly going on a trip with her husband.   Debbie Castro is here to discuss her progress with her obesity treatment plan. She is on the Category 2 Plan and states she is following her eating plan approximately 25 % of the time. She states she is exercising 60 minutes 3 times per week.   OBJECTIVE: Visit Diagnoses: Problem List Items Addressed This Visit       Cardiovascular and Mediastinum   Hypertension associated with diabetes (HCC) - Primary     Endocrine   Type 2 diabetes mellitus with hyperglycemia, without long-term current use of insulin  (HCC)     Other   Morbid obesity (HCC)   Other Visit Diagnoses       BMI 32.0-32.9,adult           Vitals Temp: 97.9 F (36.6 C) BP: 139/74 Pulse Rate: 73 SpO2: 98 %   Anthropometric Measurements Height: 5' 3 (1.6 m) Weight: 184 lb (83.5 kg) BMI (Calculated): 32.6 Weight at Last Visit: 181 lb Weight Lost Since Last Visit: 0 Weight Gained Since Last Visit: 3 Starting Weight: 226 lb   Body Composition  Body Fat %: 43.3 % Fat Mass (lbs): 79.6 lbs Muscle Mass (lbs): 99.2 lbs Total Body Water (lbs): 71.2 lbs Visceral Fat Rating : 12   Other Clinical Data Fasting: no Labs: no Today's Visit #: 39 Starting Date: 07/18/20 Comments: Cat 2     ASSESSMENT AND PLAN: Assessment & Plan Hypertension associated with diabetes (HCC) Blood pressure well controlled today.  No chest pain, chest pressure or headache. Needs a refill of lisinopril  today.  Continue current dose.   Type 2 diabetes mellitus with  hyperglycemia, without long-term current use of insulin  [facility]) Patient is interested in pursuing a higher dose of Ozempic  than what she is doing previously.  She is only taking 1mg  currently.  Will increase to what is equivalent of 1.5mg  or 18 clicks back from 2mg .  Will follow up on hunger and cravings at next appointment.  Refill of Ozempic  and Metformin  sent to pharmacy today. BMI 32.0-32.9,adult  Morbid obesity (HCC)    Diet: Jaliyah is currently in the action stage of change. As such, her goal is to continue with weight loss efforts and has agreed to the Category 2 Plan.   Exercise:  For substantial health benefits, adults should do at least 150 minutes (2 hours and 30 minutes) a week of moderate-intensity, or 75 minutes (1 hour and 15 minutes) a week of vigorous-intensity aerobic physical activity, or an equivalent combination of moderate- and vigorous-intensity aerobic activity. Aerobic activity should be performed in episodes of at least 10 minutes, and preferably, it should be spread throughout the week.  Behavior Modification:  We discussed the following Behavioral Modification Strategies today: increasing lean protein intake, decreasing simple carbohydrates, increasing vegetables, meal planning and cooking strategies, keeping healthy foods in the home, and emotional eating strategies .   Return in about 6 weeks (around 03/09/2024).   She was informed of the importance of frequent follow up visits  to maximize her success with intensive lifestyle modifications for her multiple health conditions.  Attestation Statements:   Reviewed by clinician on day of visit: allergies, medications, problem list, medical history, surgical history, family history, social history, and previous encounter notes.     Adelita Cho, MD "

## 2024-01-27 NOTE — Assessment & Plan Note (Signed)
 Blood pressure well controlled today.  No chest pain, chest pressure or headache. Needs a refill of lisinopril  today.  Continue current dose.

## 2024-01-28 ENCOUNTER — Other Ambulatory Visit (INDEPENDENT_AMBULATORY_CARE_PROVIDER_SITE_OTHER): Payer: Self-pay

## 2024-03-09 ENCOUNTER — Ambulatory Visit (INDEPENDENT_AMBULATORY_CARE_PROVIDER_SITE_OTHER): Admitting: Family Medicine

## 2024-09-27 ENCOUNTER — Ambulatory Visit: Admitting: Obstetrics and Gynecology
